# Patient Record
Sex: Female | Born: 1950 | Race: White | Hispanic: No | Smoking: Former smoker
Health system: Southern US, Community
[De-identification: ages and names within clinical notes are randomized; demographics above are authoritative.]

## PROBLEM LIST (undated history)

## (undated) DIAGNOSIS — K760 Fatty (change of) liver, not elsewhere classified: Secondary | ICD-10-CM

## (undated) DIAGNOSIS — F329 Major depressive disorder, single episode, unspecified: Secondary | ICD-10-CM

## (undated) DIAGNOSIS — J45909 Unspecified asthma, uncomplicated: Secondary | ICD-10-CM

## (undated) DIAGNOSIS — Z9889 Other specified postprocedural states: Secondary | ICD-10-CM

## (undated) DIAGNOSIS — E119 Type 2 diabetes mellitus without complications: Secondary | ICD-10-CM

## (undated) DIAGNOSIS — Z460 Encounter for fitting and adjustment of spectacles and contact lenses: Secondary | ICD-10-CM

## (undated) DIAGNOSIS — R0602 Shortness of breath: Secondary | ICD-10-CM

## (undated) DIAGNOSIS — G4730 Sleep apnea, unspecified: Secondary | ICD-10-CM

## (undated) DIAGNOSIS — U071 COVID-19: Secondary | ICD-10-CM

## (undated) DIAGNOSIS — M797 Fibromyalgia: Secondary | ICD-10-CM

## (undated) DIAGNOSIS — F32A Depression, unspecified: Secondary | ICD-10-CM

## (undated) DIAGNOSIS — Z87442 Personal history of urinary calculi: Secondary | ICD-10-CM

## (undated) DIAGNOSIS — M1990 Unspecified osteoarthritis, unspecified site: Secondary | ICD-10-CM

## (undated) DIAGNOSIS — N189 Chronic kidney disease, unspecified: Secondary | ICD-10-CM

## (undated) DIAGNOSIS — I10 Essential (primary) hypertension: Secondary | ICD-10-CM

## (undated) DIAGNOSIS — K219 Gastro-esophageal reflux disease without esophagitis: Secondary | ICD-10-CM

## (undated) DIAGNOSIS — C801 Malignant (primary) neoplasm, unspecified: Secondary | ICD-10-CM

## (undated) DIAGNOSIS — K589 Irritable bowel syndrome without diarrhea: Secondary | ICD-10-CM

## (undated) DIAGNOSIS — E785 Hyperlipidemia, unspecified: Secondary | ICD-10-CM

## (undated) DIAGNOSIS — R112 Nausea with vomiting, unspecified: Secondary | ICD-10-CM

## (undated) DIAGNOSIS — I499 Cardiac arrhythmia, unspecified: Secondary | ICD-10-CM

## (undated) DIAGNOSIS — J302 Other seasonal allergic rhinitis: Secondary | ICD-10-CM

## (undated) HISTORY — DX: Irritable bowel syndrome, unspecified: K58.9

## (undated) HISTORY — PX: COLONOSCOPY: SHX174

## (undated) HISTORY — PX: LUMBAR FUSION: SHX111

## (undated) HISTORY — PX: TONSILLECTOMY: SUR1361

---

## 1988-07-02 HISTORY — PX: DIAGNOSTIC LAPAROSCOPY: SUR761

## 1993-07-02 DIAGNOSIS — C801 Malignant (primary) neoplasm, unspecified: Secondary | ICD-10-CM

## 1993-07-02 HISTORY — DX: Malignant (primary) neoplasm, unspecified: C80.1

## 1993-07-02 HISTORY — PX: BREAST LUMPECTOMY WITH AXILLARY LYMPH NODE DISSECTION: SHX5756

## 1997-11-18 ENCOUNTER — Other Ambulatory Visit: Admission: RE | Admit: 1997-11-18 | Discharge: 1997-11-18 | Payer: Self-pay | Admitting: Obstetrics and Gynecology

## 1998-02-02 ENCOUNTER — Ambulatory Visit (HOSPITAL_COMMUNITY): Admission: RE | Admit: 1998-02-02 | Discharge: 1998-02-02 | Payer: Self-pay | Admitting: Gastroenterology

## 1998-03-04 ENCOUNTER — Encounter: Payer: Self-pay | Admitting: Gastroenterology

## 1998-03-04 ENCOUNTER — Ambulatory Visit (HOSPITAL_COMMUNITY): Admission: RE | Admit: 1998-03-04 | Discharge: 1998-03-04 | Payer: Self-pay | Admitting: Gastroenterology

## 1999-01-18 ENCOUNTER — Other Ambulatory Visit: Admission: RE | Admit: 1999-01-18 | Discharge: 1999-01-18 | Payer: Self-pay | Admitting: *Deleted

## 1999-01-18 ENCOUNTER — Encounter (INDEPENDENT_AMBULATORY_CARE_PROVIDER_SITE_OTHER): Payer: Self-pay

## 2000-01-24 ENCOUNTER — Other Ambulatory Visit: Admission: RE | Admit: 2000-01-24 | Discharge: 2000-01-24 | Payer: Self-pay | Admitting: *Deleted

## 2000-04-09 ENCOUNTER — Other Ambulatory Visit: Admission: RE | Admit: 2000-04-09 | Discharge: 2000-04-09 | Payer: Self-pay | Admitting: Radiology

## 2000-04-09 ENCOUNTER — Encounter (INDEPENDENT_AMBULATORY_CARE_PROVIDER_SITE_OTHER): Payer: Self-pay

## 2001-03-10 ENCOUNTER — Other Ambulatory Visit: Admission: RE | Admit: 2001-03-10 | Discharge: 2001-03-10 | Payer: Self-pay | Admitting: Obstetrics and Gynecology

## 2002-04-10 ENCOUNTER — Other Ambulatory Visit: Admission: RE | Admit: 2002-04-10 | Discharge: 2002-04-10 | Payer: Self-pay | Admitting: Obstetrics and Gynecology

## 2002-06-23 ENCOUNTER — Encounter: Admission: RE | Admit: 2002-06-23 | Discharge: 2002-06-23 | Payer: Self-pay | Admitting: Emergency Medicine

## 2002-06-23 ENCOUNTER — Encounter: Payer: Self-pay | Admitting: Emergency Medicine

## 2003-03-22 ENCOUNTER — Encounter (INDEPENDENT_AMBULATORY_CARE_PROVIDER_SITE_OTHER): Payer: Self-pay | Admitting: Specialist

## 2003-03-22 ENCOUNTER — Ambulatory Visit (HOSPITAL_COMMUNITY): Admission: RE | Admit: 2003-03-22 | Discharge: 2003-03-22 | Payer: Self-pay | Admitting: Gastroenterology

## 2003-05-19 ENCOUNTER — Other Ambulatory Visit: Admission: RE | Admit: 2003-05-19 | Discharge: 2003-05-19 | Payer: Self-pay | Admitting: Obstetrics and Gynecology

## 2003-07-12 ENCOUNTER — Encounter: Admission: RE | Admit: 2003-07-12 | Discharge: 2003-07-12 | Payer: Self-pay | Admitting: Emergency Medicine

## 2003-07-27 ENCOUNTER — Encounter: Admission: RE | Admit: 2003-07-27 | Discharge: 2003-10-25 | Payer: Self-pay | Admitting: Emergency Medicine

## 2003-12-09 ENCOUNTER — Encounter: Admission: RE | Admit: 2003-12-09 | Discharge: 2004-03-08 | Payer: Self-pay | Admitting: Emergency Medicine

## 2005-10-02 ENCOUNTER — Encounter: Admission: RE | Admit: 2005-10-02 | Discharge: 2005-10-02 | Payer: Self-pay | Admitting: Emergency Medicine

## 2006-07-23 ENCOUNTER — Encounter: Admission: RE | Admit: 2006-07-23 | Discharge: 2006-07-23 | Payer: Self-pay | Admitting: Emergency Medicine

## 2006-07-30 ENCOUNTER — Encounter: Admission: RE | Admit: 2006-07-30 | Discharge: 2006-07-30 | Payer: Self-pay | Admitting: Emergency Medicine

## 2008-12-30 ENCOUNTER — Emergency Department (HOSPITAL_COMMUNITY): Admission: EM | Admit: 2008-12-30 | Discharge: 2008-12-30 | Payer: Self-pay | Admitting: Emergency Medicine

## 2009-01-15 ENCOUNTER — Inpatient Hospital Stay (HOSPITAL_COMMUNITY): Admission: EM | Admit: 2009-01-15 | Discharge: 2009-01-15 | Payer: Self-pay | Admitting: Emergency Medicine

## 2009-01-16 ENCOUNTER — Encounter: Admission: RE | Admit: 2009-01-16 | Discharge: 2009-01-16 | Payer: Self-pay | Admitting: Emergency Medicine

## 2009-01-18 ENCOUNTER — Encounter: Payer: Self-pay | Admitting: Internal Medicine

## 2009-01-23 ENCOUNTER — Inpatient Hospital Stay (HOSPITAL_COMMUNITY): Admission: EM | Admit: 2009-01-23 | Discharge: 2009-01-25 | Payer: Self-pay | Admitting: Emergency Medicine

## 2009-01-24 HISTORY — PX: CARDIAC CATHETERIZATION: SHX172

## 2009-01-25 ENCOUNTER — Encounter: Payer: Self-pay | Admitting: Internal Medicine

## 2009-02-03 ENCOUNTER — Encounter: Admission: RE | Admit: 2009-02-03 | Discharge: 2009-02-03 | Payer: Self-pay | Admitting: Emergency Medicine

## 2009-07-02 HISTORY — PX: LUMBAR FUSION: SHX111

## 2009-08-04 ENCOUNTER — Encounter: Payer: Self-pay | Admitting: Internal Medicine

## 2009-08-09 ENCOUNTER — Encounter: Admission: RE | Admit: 2009-08-09 | Discharge: 2009-08-09 | Payer: Self-pay | Admitting: Emergency Medicine

## 2009-09-20 DIAGNOSIS — E785 Hyperlipidemia, unspecified: Secondary | ICD-10-CM | POA: Insufficient documentation

## 2009-09-20 DIAGNOSIS — I10 Essential (primary) hypertension: Secondary | ICD-10-CM | POA: Insufficient documentation

## 2009-09-20 DIAGNOSIS — K219 Gastro-esophageal reflux disease without esophagitis: Secondary | ICD-10-CM | POA: Insufficient documentation

## 2009-09-20 DIAGNOSIS — I4891 Unspecified atrial fibrillation: Secondary | ICD-10-CM | POA: Insufficient documentation

## 2009-09-21 ENCOUNTER — Ambulatory Visit: Payer: Self-pay | Admitting: Internal Medicine

## 2009-10-03 ENCOUNTER — Encounter: Payer: Self-pay | Admitting: Internal Medicine

## 2009-10-06 ENCOUNTER — Telehealth: Payer: Self-pay | Admitting: Internal Medicine

## 2009-10-26 ENCOUNTER — Ambulatory Visit: Payer: Self-pay | Admitting: Internal Medicine

## 2009-11-06 ENCOUNTER — Encounter: Admission: RE | Admit: 2009-11-06 | Discharge: 2009-11-06 | Payer: Self-pay | Admitting: Neurosurgery

## 2009-11-08 ENCOUNTER — Inpatient Hospital Stay (HOSPITAL_COMMUNITY): Admission: RE | Admit: 2009-11-08 | Discharge: 2009-11-10 | Payer: Self-pay | Admitting: Neurosurgery

## 2009-12-02 ENCOUNTER — Telehealth: Payer: Self-pay | Admitting: Internal Medicine

## 2009-12-05 ENCOUNTER — Ambulatory Visit: Payer: Self-pay | Admitting: Internal Medicine

## 2009-12-09 ENCOUNTER — Telehealth: Payer: Self-pay | Admitting: Internal Medicine

## 2009-12-21 ENCOUNTER — Encounter: Admission: RE | Admit: 2009-12-21 | Discharge: 2009-12-21 | Payer: Self-pay | Admitting: Neurosurgery

## 2009-12-29 ENCOUNTER — Telehealth: Payer: Self-pay | Admitting: Internal Medicine

## 2010-01-20 ENCOUNTER — Telehealth: Payer: Self-pay | Admitting: Internal Medicine

## 2010-02-06 ENCOUNTER — Encounter: Admission: RE | Admit: 2010-02-06 | Discharge: 2010-02-06 | Payer: Self-pay | Admitting: Neurosurgery

## 2010-02-06 ENCOUNTER — Telehealth: Payer: Self-pay | Admitting: Internal Medicine

## 2010-02-27 ENCOUNTER — Telehealth: Payer: Self-pay | Admitting: Internal Medicine

## 2010-03-07 ENCOUNTER — Telehealth: Payer: Self-pay | Admitting: Internal Medicine

## 2010-03-07 ENCOUNTER — Ambulatory Visit: Payer: Self-pay | Admitting: Internal Medicine

## 2010-03-09 ENCOUNTER — Telehealth: Payer: Self-pay | Admitting: Internal Medicine

## 2010-03-10 ENCOUNTER — Telehealth: Payer: Self-pay | Admitting: Internal Medicine

## 2010-03-16 ENCOUNTER — Ambulatory Visit: Payer: Self-pay | Admitting: Internal Medicine

## 2010-03-16 ENCOUNTER — Encounter: Payer: Self-pay | Admitting: Internal Medicine

## 2010-03-21 ENCOUNTER — Encounter: Payer: Self-pay | Admitting: Internal Medicine

## 2010-03-28 ENCOUNTER — Telehealth: Payer: Self-pay | Admitting: Internal Medicine

## 2010-04-04 ENCOUNTER — Encounter: Payer: Self-pay | Admitting: Internal Medicine

## 2010-04-18 ENCOUNTER — Telehealth: Payer: Self-pay | Admitting: Internal Medicine

## 2010-04-24 ENCOUNTER — Encounter: Payer: Self-pay | Admitting: Internal Medicine

## 2010-07-02 HISTORY — PX: CARDIAC ELECTROPHYSIOLOGY MAPPING AND ABLATION: SHX1292

## 2010-07-23 ENCOUNTER — Encounter: Payer: Self-pay | Admitting: Emergency Medicine

## 2010-07-24 ENCOUNTER — Encounter: Payer: Self-pay | Admitting: Emergency Medicine

## 2010-08-01 NOTE — Assessment & Plan Note (Signed)
Summary: EKG/  meds making her feel weak/kl   Visit Type:  Follow-up Referring Provider:  Dr Tresa Endo Primary Provider:  Leslee Home, MD   History of Present Illness: The patient presents today for electrophysiology followup. She reports significant reduction in her afib burden with flecainide.  She has noticed over the past 2-3 days, increased fatigue and decreased exercise tolerance.  She denies chest pain, shortness of breath, orthopnea, PND, lower extremity edema, dizziness, presyncope, syncope, or neurologic sequela.   The patient is tolerating medications without difficulties and is otherwise without complaint today.   Current Medications (verified): 1)  Flexeril 10 Mg Tabs (Cyclobenzaprine Hcl) .[name].. 1 Tab Three Times A Day 2)  Glipizide 5 Mg Xr24h-Tab (Glipizide) .... Take One Tablet By Mouth Once Daily. 3)  Lipitor 10 Mg Tabs (Atorvastatin Calcium) .... Take One Tablet By Mouth Daily. 4)  Nabumetone 500 Mg Tabs (Nabumetone) .... Take One Tablet By Mouth Twice Daily. 5)  Ramipril 5 Mg Caps (Ramipril) .... Take One Capsule By Mouth Daily 6)  Metoprolol Tartrate 100 Mg Tabs (Metoprolol Tartrate) .... Take One Tablet By Mouth Twice A Day 7)  Warfarin Sodium 5 Mg Tabs (Warfarin Sodium) .... Use As Directed By Anticoagulation Clinic 8)  Diltiazem Hcl Er Beads 180 Mg Xr24h-Cap (Diltiazem Hcl Er Beads) .... Take One Capsule By Mouth Daily 9)  Vitamin D3 1000 Unit Caps (Cholecalciferol) .... 4 Tablets Once Daily 10)  Vitamin C 1000 Mg Tabs (Ascorbic Acid) .... Take One Tablet By Mouth Twice Daily. 11)  Calcium Carbonate 600 Mg Tabs (Calcium Carbonate) .... Take One Tablet By Mouth Once Daily. 12)  Magnesium Oxide 500 Mg Tabs (Magnesium Oxide) .... Take One Tablet By Mouth Once Daily. 13)  Omeprazole 20 Mg Cpdr (Omeprazole) .... Take One Tablet By Mouth Once Daily. 14)  Multivitamins   Tabs (Multiple Vitamin) .... Take One Tablet By Mouth Once Daily 15)  Flecainide Acetate 100 Mg Tabs  (Flecainide Acetate) .... Take One Tablet By Mouth Every 12 Hours 16)  Lyrica 75 Mg Caps (Pregabalin) .... 2 Capsules Two Times A Day 17)  Furosemide 20 Mg Tabs (Furosemide) .... Take One Tablet Once Daily 18)  Potassium Chloride Crys Cr 20 Meq Cr-Tabs (Potassium Chloride Crys Cr) .... Take One Tablet By Mouth Daily 19)  Co Q 10 10 Mg Caps (Coenzyme Q10) .... Daily  Allergies: 1)  ! Asa 2)  ! Ibuprofen  Past History:  Past Medical History: Reviewed history from 09/21/2009 and no changes required. 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 L breast lumpectomy with lymph node dissection 1995 abdominal laporoscopy 1990 tonsellectomy 1962 adnoidectomy 1957  Social History: Reviewed history from 09/21/2009 and no changes required. Pt lives in [place], alone.  Divorced. Work for [employer] for [employer]- quit at age 44 ETOH- none Drugs- none  Review of Systems       All systems are reviewed and negative except as listed in the HPI.   Vital Signs:  Patient profile:   60 year old female Height:      61 inches Weight:      260 pounds BMI:     49.30 O2 Sat:      96 % on Room air Pulse rate:  60 / minute BP sitting:   108 / 62  (left arm)  Vitals Entered By: Laurance Flatten CMA (March 07, 2010 10:44 AM)  O2 Flow:  Room air  Physical Exam  General:  morbid obesity, NAD Head:  normocephalic and atraumatic Eyes:  PERRLA/EOM intact; conjunctiva and lids normal. Mouth:  Teeth, gums and palate normal. Oral mucosa normal. Neck:  Neck supple, no JVD. No masses, thyromegaly or abnormal cervical nodes. Lungs:  Clear bilaterally to auscultation and percussion. Heart:   RRR, no m/r/g Abdomen:  Bowel sounds positive; abdomen soft and non-tender without masses, organomegaly, or hernias noted. No hepatosplenomegaly. Msk:  Back normal, normal gait. Muscle strength and tone normal. Pulses:  pulses normal in all 4 extremities Extremities:  No clubbing or cyanosis.  trace edema Neurologic:  Alert and oriented x 3.  strength/sensation are intact   EKG  Procedure date:  03/07/2010  Findings:      sinus rhythm 60 bpm, PR 208, QRS 98, QTc 440, otherwise normal ekg  Impression & Recommendations:  Problem # 1:  ATRIAL FIBRILLATION, PAROXYSMAL (ICD-427.31)  stable we will decrease metoprolol to 50mg  two times a day as this may be contributing to fatigue we will consider decreasing metoprolol further as needed. I will plan GXT in the next few weeks to evaluate for exercise induced arrhythmia with flecainide.  Orders: Treadmill (Treadmill)  Problem # 2:  HYPERTENSION (ICD-401.9)  stable as above  Orders: Treadmill (Treadmill)  Problem # 3:  HYPERLIPIDEMIA (ICD-272.4)  stable Her updated medication list for this problem includes:    Lipitor 10 Mg Tabs (Atorvastatin calcium) .[name]... Take one tablet by mouth daily.  Her updated medication list for this problem includes:    Lipitor 10 Mg Tabs (Atorvastatin calcium) .[name]... Take one tablet by mouth daily.  Problem # 4:  GERD (ICD-530.81)  stable Her updated medication list for this problem includes:    Omeprazole 20 Mg Cpdr (Omeprazole) .[name]... Take one tablet by mouth once daily.  Her updated medication list for this problem includes:    Omeprazole 20 Mg Cpdr (Omeprazole) .[name]... Take one tablet by mouth once daily.  Patient Instructions: 1)  Your physician recommends that you schedule a follow-up appointment with Dr Johney Frame in one week with a GXT 2)  Your physician has recommended you make the following change in your medication: decrease Metoprolol  to 50mg  two times a day 3)  Your physician has  requested that you have an exercise tolerance test.  For further information please visit [email or web address]  Please also follow instruction sheet, as given. 4)  Check on Pradaxa and the cost

## 2010-08-01 NOTE — Assessment & Plan Note (Signed)
Summary: nep/afib/jml   Visit Type:  Initial Consult Referring Provider:  Dr Tresa Endo Primary Provider:  Leslee Home, MD  CC:  afib.  History of Present Illness: Katie Owens is a pleasant 60 yo WF with a h/o paroxysmal atrial fibrillation who presents today for EP consultation.  She reports having an "irregular" heart beat since high school.  She subsequently began having significant increases in "irregular heart beats" over the past 10 years.   December 30 2008, she developed rapid heart racing and was found to have atrial fibrillation with symptoms of "heart racing" and presyncope.  She was evaluated at [facility] ER and initiated on diltiazem.  She reports having multiple subsequent episodes of afib.  She failed medical therapy with diltiazem and was subsequently placed on Multaq.  She did not tolerate multaq due to nausea and continued to have afib.  She has subsequently been taking sotalol.  With sotalol, she reports symptoms of arrhythmia 2-3 times per week.  Episodes begin and end abruptly, lasting typically 2-3 hours.  She is unware of triggers or precipitants.  She was diagnosed with OSA and has been compliant with CPAP.  Current Medications (verified): 1)  Flexeril 10 Mg Tabs (Cyclobenzaprine Hcl) .[name].. 1 Tab Three Times A Day 2)  Glipizide 5 Mg Xr24h-Tab (Glipizide) .... Take One Tablet By Mouth Once Daily. 3)  Lipitor 10 Mg Tabs (Atorvastatin Calcium) .... Take One Tablet By Mouth Daily. 4)  Nabumetone 500 Mg Tabs (Nabumetone) .... Take One Tablet By Mouth Twice Daily. 5)  Ramipril 5 Mg Caps (Ramipril) .... Take One Capsule By Mouth Daily 6)  Betapace 120 Mg Tabs (Sotalol Hcl) .... Take One Tablet By Mouth Twice Daily. 7)  Metoprolol Tartrate 100 Mg Tabs (Metoprolol Tartrate) .... Take One Tablet By Mouth Twice A Day 8)  Warfarin Sodium 5 Mg Tabs (Warfarin Sodium) .... Use As Directed By Anticoagulation Clinic 9)  Diltiazem Hcl Er Beads 180 Mg Xr24h-Cap (Diltiazem Hcl Er Beads) .... Take One  Capsule By Mouth Daily 10)  Vitamin D3 1000 Unit Caps (Cholecalciferol) .... 4 Tablets Once Daily 11)  Vitamin C 1000 Mg Tabs (Ascorbic Acid) .... Take One Tablet By Mouth Twice Daily. 12)  Calcium Carbonate 600 Mg Tabs (Calcium Carbonate) .... Take One Tablet By Mouth Once Daily. 13)  Magnesium Oxide 500 Mg Tabs (Magnesium Oxide) .... Take One Tablet By Mouth Once Daily. 14)  Omeprazole 20 Mg Cpdr (Omeprazole) .... Take One Tablet By Mouth Once Daily. 15)  Multivitamins   Tabs (Multiple Vitamin) .... Take One Tablet By Mouth Twice Daily.  Allergies (verified): 1)  ! Asa 2)  ! 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  Divorced. Work for [employer] for [employer]- quit at age 80 ETOH- none Drugs- none  Review of Systems       All systems are reviewed and negative except as listed in the HPI.   Vital Signs:  Patient profile:   60 year old female Height:  61 inches Weight:      269 pounds BMI:     51.01 Pulse rate:   67 / minute BP sitting:   120 / 70  (left arm)  Vitals Entered By: Laurance Flatten CMA (September 21, 2009 2:25 PM)  Physical Exam  General:  morbid obesity, NAD Head:  normocephalic and atraumatic Eyes:  PERRLA/EOM intact; conjunctiva and lids normal. Mouth:  Teeth, gums and palate normal. Oral mucosa normal. Neck:  Neck supple, no JVD. No masses, thyromegaly or abnormal cervical  nodes. Lungs:  Clear bilaterally to auscultation and percussion. Heart:  RRR, no m/r/g Abdomen:  Bowel sounds positive; abdomen soft and non-tender without masses, organomegaly, or hernias noted. No hepatosplenomegaly. Msk:  Back normal, normal gait. Muscle strength and tone normal. Pulses:  pulses normal in all 4 extremities Extremities:  No clubbing or cyanosis.  trace edema Neurologic:  Alert and oriented x 3.  strength/sensation are intact Skin:  Intact without lesions or rashes. Cervical Nodes:  no significant adenopathy Psych:  Normal affect.   EKG  Procedure date:  09/21/2009  Findings:      sinus rhythm, PT 178, QRS 84, QT 460, otherwise normal ekg  Impression & Recommendations:  Problem # 1:  ATRIAL FIBRILLATION, PAROXYSMAL (ICD-427.31) The patient presents today for EP consultation regarding management for atrial arrhythmias.  I have reviewed her event monitor from 11/10 which reveals atrial fibrillation as well as atrial flutter and also frequent PACS/PVCs.  She has a regular SVT which is either atrial flutter or an atrial tachycardia.  She appears to have focal triggers for her atrial arrhythmias. Therapeutic strategies for atrial arrhythmias including medicine and ablation were discussed in detail with the patient today. Risk, benefits, and alternatives to EP study and radiofrequency ablation were also discussed in detail today. At this point, she wishes to continue medical therapy and defer ablation.  Given her recent cath which revealed normal coronary arteries and focal triggers on event monitor, I have recommended flecainide therapy at this time.  She will stop sotalol and then start flecainide 100mg  two times a day after a 48 hour sotalol washout period.  I will then see her again to reassess her afib burden.  If she has continued difficulty with afib, then her options would be either amiodarone or ablation.  She is aware that aggressive lifestyle modification and weight  loss would improve her longterm ability to maintain sinus rhythm and also improve safety of catheter ablation should it be required.  CPAP compliance for OSA was also encouraged  Problem # 2:  HYPERTENSION (ICD-401.9) stable no changes  Problem # 3:  HYPERLIPIDEMIA (ICD-272.4) stable no changes  Patient Instructions: 1)  Your physician recommends that you schedule a follow-up appointment in: 4-5 weks with Dr Johney Frame 2)  Continue regular follow-up with Dr Tresa Endo 3)  Your physician has recommended you make the following change in your medication: Stop Sotalol and be off for 48 hours prior to sstarting new medication  Flecainide100mg  two times a day Prescriptions: FLECAINIDE ACETATE 100 MG TABS (FLECAINIDE ACETATE) one by mouth bid  #60 x 6   Entered by:   Dennis Bast, RN, BSN   Authorized by:   Hillis Range, MD   Signed by:   Dennis Bast, RN, BSN on 09/21/2009   Method used:   Electronically to        [id number] (retail)       [address]       [place], [place]  [address]       Ph: [phone] or [phone]       Fax: [phone]   RxID:   [id number]

## 2010-08-01 NOTE — Progress Notes (Signed)
Summary: Pt having arrhythmias   Phone Note Call from Patient Call back at Home Phone [phone]   Caller: Patient Summary of Call: Pt calling regarding having arrhythmias from 4:30 yesterday to 6:00am this morning and it's started again today at 4:30 Initial call taken by: Judie Grieve,  March 10, 2010 4:36 PM  Follow-up for Phone Call        spoke with Dr Johney Frame pt to increase her Metoprolol back up to 100mg  two times a day.  She feels like she was doing better on Betapace.  She will discuss this with him on Thurs at her appointment Dennis Bast, RN, BSN  March 10, 2010 4:57 PM    New/Updated Medications: METOPROLOL TARTRATE 100 MG TABS (METOPROLOL TARTRATE) Take 1 tablet by mouth twice a day

## 2010-08-01 NOTE — Miscellaneous (Signed)
Summary: [facility] Order/Treatment Plan   [facility] Order/Treatment Plan   Imported By: Roderic Ovens 04/10/2010 16:54:35  _____________________________________________________________________  External Attachment:    Type:   Image     Comment:   External Document

## 2010-08-01 NOTE — Progress Notes (Signed)
Summary: Pt having arrythmia  Phone Note Call from Patient Call back at Home Phone [phone]   Caller: Patient Summary of Call: Pt having arrythmia Initial call taken by: Judie Grieve,  February 27, 2010 4:53 PM  Follow-up for Phone Call        pt changed from Betapace to Flecanide 100mg  two times a day--she took the   first dose of Flecanide yesterday morning--starting about 15-20 minutes ago she felt like her heart began to be fast and irregular-she is unable to take her pulse but feels like it is in the 140 range,she does not know her B/P-she is wondering if this is normal after making the medication change --she denies any dizziness or feeling faint--reviewed with Dr Koleen Distance recommendation was to take evening  dose of Metoprolol now,continue to monitor heart rate --if she feels like her heart rate is fast and irregular tomorrow she should call and come to office for an EKG --she is aware of Dr Jenel Lucks recommendations--

## 2010-08-01 NOTE — Progress Notes (Signed)
Summary: refill request  Phone Note Refill Request Message from:  Patient on January 20, 2010 12:06 PM  Refills Requested: Medication #1:  DILTIAZEM HCL ER BEADS 180 MG XR24H-CAP Take one capsule by mouth daily [facility]- needs today only has 1 left   Method Requested: Telephone to Pharmacy Initial call taken by: Glynda Jaeger,  January 20, 2010 12:06 PM Caller: Patient Reason for Call: Talk to Nurse    Prescriptions: DILTIAZEM HCL ER BEADS 180 MG XR24H-CAP (DILTIAZEM HCL ER BEADS) Take one capsule by mouth daily  #90 x 3   Entered by:   Laurance Flatten CMA   Authorized by:   Hillis Range, MD   Signed by:   Laurance Flatten CMA on 01/20/2010   Method used:   Electronically to        [id number] (retail)       [address]       [place], [place]  [address]       Ph: [phone] or [phone]       Fax: [phone]   RxID:   [id number]

## 2010-08-01 NOTE — Miscellaneous (Signed)
Summary: [facility] Progress Note   [facility] Progress Note   Imported By: Roderic Ovens 04/19/2010 15:51:09  _____________________________________________________________________  External Attachment:    Type:   Image     Comment:   External Document

## 2010-08-01 NOTE — Assessment & Plan Note (Signed)
Summary: Katie Owens   Referring Provider:  Dr Tresa Endo Primary Provider:  Leslee Home, MD  CC:  rov/pt having more problems with palpitations.  She states she has an episode last night that lasted for 3 hrs. She is SOB on exertion.Katie Owens  History of Present Illness: The patient presents today for routine electrophysiology followup. She reports doing very well since last being seen in our clinic.  She reports increasing frequency and duration of atrial fibrillation with sotalol.  She was not compliant with switching to flecainide as I previously suggested.  The patient denies symptoms of chest pain, shortness of breath, orthopnea, PND, lower extremity edema, dizziness, presyncope, syncope, or neurologic sequela.  She is primarily limited  by her chronic backpain for which she recently had a laminectomy 5/11. The patient is tolerating medications without difficulties and is otherwise without complaint today.   Current Medications (verified): 1)  Flexeril 10 Mg Tabs (Cyclobenzaprine Hcl) .Katie Owens.. 1 Tab Three Times A Day 2)  Glipizide 5 Mg Xr24h-Tab (Glipizide) .... Take One Tablet By Mouth Once Daily. 3)  Lipitor 10 Mg Tabs (Atorvastatin Calcium) .... Take One Tablet By Mouth Daily. 4)  Nabumetone 500 Mg Tabs (Nabumetone) .... Take One Tablet By Mouth Twice Daily. 5)  Ramipril 5 Mg Caps (Ramipril) .... Take One Capsule By Mouth Daily 6)  Metoprolol Tartrate 100 Mg Tabs (Metoprolol Tartrate) .... Take One Tablet By Mouth Twice A Day 7)  Warfarin Sodium 5 Mg Tabs (Warfarin Sodium) .... Use As Directed By Anticoagulation Clinic 8)  Diltiazem Hcl Er Beads 180 Mg Xr24h-Cap (Diltiazem Hcl Er Beads) .... Take One Capsule By Mouth Daily 9)  Vitamin D3 1000 Unit Caps (Cholecalciferol) .... 4 Tablets Once Daily 10)  Vitamin C 1000 Mg Tabs (Ascorbic Acid) .... Take One Tablet By Mouth Twice Daily. 11)  Calcium Carbonate 600 Mg Tabs (Calcium Carbonate) .... Take One Tablet By Mouth  Once Daily. 12)  Magnesium Oxide 500 Mg Tabs (Magnesium Oxide) .... Take One Tablet By Mouth Once Daily. 13)  Omeprazole 20 Mg Cpdr (Omeprazole) .... Take One Tablet By Mouth Once Daily. 14)  Multivitamins   Tabs (Multiple Vitamin) .... Take One Tablet By Mouth Once Daily 15)  Sotalol Hcl 120 Mg Tabs (Sotalol Hcl) .... Take One Tablet By Mouth Twice A Day 16)  Percocet 5-325 Mg Tabs (Oxycodone-Acetaminophen) .... Uad 17)  Lyrica 75 Mg Caps (Pregabalin) .... 2 Capsules Two Times A Day 18)  Furosemide 20 Mg Tabs (Furosemide) .... Take One Tablet Once Daily 19)  Potassium Chloride Crys Cr 20 Meq Cr-Tabs (Potassium Chloride Crys Cr) .... Take One Tablet By Mouth Daily 20)  Butrans 5 Mcg/hr Ptwk (Buprenorphine) .... Weekly  Allergies (verified): 1)  ! Asa 2)  ! Ibuprofen  Past History:  Past Medical History: Reviewed history from 09/21/2009 and no changes required. 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 L breast lumpectomy with lymph node dissection 1995 abdominal laporoscopy 1990 tonsellectomy 1962 adnoidectomy 1957  Social History: Reviewed history from 09/21/2009 and no changes required. Pt lives in [place], alone.  Divorced. Work for [employer] for [employer]- quit at age 49 ETOH- none Drugs- none  Review of Systems       All  systems are reviewed and negative except as listed in the HPI.   Vital Signs:  Patient profile:   60 year old female Height:      61 inches Weight:      259 pounds BMI:     49.11 Pulse rate:   66 / minute Pulse rhythm:   regular BP sitting:   136 / 62  (left arm) Cuff size:   large  Vitals Entered By: [name] CMA (December 05, 2009 9:20 AM)  Physical Exam  General:  morbid obesity, NAD Head:  normocephalic and atraumatic Eyes:  PERRLA/EOM intact; conjunctiva and lids normal. Mouth:  Teeth, gums and palate normal. Oral mucosa normal. Neck:  Neck supple, no JVD. No masses, thyromegaly or abnormal cervical nodes. Lungs:  Clear bilaterally to auscultation and percussion. Heart:  RRR, no m/r/g Abdomen:  Bowel sounds positive; abdomen soft and non-tender without masses, organomegaly, or hernias noted. No hepatosplenomegaly. Msk:  Back normal, normal gait. Muscle strength and tone normal. Pulses:  pulses normal in all 4 extremities Extremities:  No clubbing or cyanosis.  trace edema Neurologic:  Alert and oriented x 3.  strength/sensation are intact   EKG  Procedure date:  12/05/2009  Findings:      sinus rhythm 66 bpm, PR 176, QRS 84, QT 470, otherise normal ekg  Impression & Recommendations:  Problem # 1:  ATRIAL FIBRILLATION, PAROXYSMAL (ICD-427.31) The patient presents today for EP follow-up regarding management for atrial arrhythmias.  I have previously reviewed her event monitor from 11/10 which reveals atrial fibrillation as well as atrial flutter and also frequent PACS/PVCs.  She has a regular SVT which is either atrial flutter or an atrial tachycardia.  She appears to have focal triggers for her atrial arrhythmias.  Though I have recommended flecainide, she has been reluctant to take it.   Her cath 7/10 was normal.  She now wishes to take flecainide.   She will continue coumadin. I have recommended aggressive lifestyle modification, including weight reduction.  I think that with weight loss, our efforts to maintain sinus rhythm would be improved.   She will stop sotalol and start flecainide 100mg  two times a day after a 72 hour washout period.  Problem # 2:  HYPERTENSION (ICD-401.9) stable  Other Orders: EKG w/ Interpretation (93000)  Patient Instructions: 1)  Your physician recommends that you  schedule a follow-up appointment in: 4 weeks with Dr Johney Frame 2)  Your physician has recommended you make the following change in your medication: stop Sotalol.  After 72 hours start Flecainide 100mg  two times a day

## 2010-08-01 NOTE — Letter (Signed)
Summary: [facility] Clearance   Imported By: Roderic Ovens 10/17/2009 16:05:46  _____________________________________________________________________  External Attachment:    Type:   Image     Comment:   External Document

## 2010-08-01 NOTE — Letter (Signed)
Summary: Med List  Med List   Imported By: Marylou Mccoy 09/22/2009 15:11:03  _____________________________________________________________________  External Attachment:    Type:   Image     Comment:   External Document

## 2010-08-01 NOTE — Progress Notes (Signed)
Summary: med refill / unable to switch due to work scheudle  Phone Note Call from Patient Call back at [employer] [phone]   Caller: Patient Reason for Call: Talk to Nurse Summary of Call: pt needs sotalol refill has not made the switch to flecanide [facility] [phone] Initial call taken by: Edman Circle,  February 06, 2010 12:56 PM  Follow-up for Phone Call        [facility] . pt unable to switch meds over due to work scheudle. [phone] Lorne Skeens  February 08, 2010 12:30 PM   Additional Follow-up for Phone Call Additional follow up Details #1::        called in sotalol 120mg . Pt states that she  will start flecainide at the end of this month. Additional Follow-up by: Laurance Flatten CMA,  February 09, 2010 2:02 PM    New/Updated Medications: SOTALOL HCL 120 MG TABS (SOTALOL HCL) Take one tablet by mouth twice a day Prescriptions: SOTALOL HCL 120 MG TABS (SOTALOL HCL) Take one tablet by mouth twice a day  #60 x 0   Entered by:   Laurance Flatten CMA   Authorized by:   Hillis Range, MD   Signed by:   Laurance Flatten CMA on 02/08/2010   Method used:   Electronically to        [id number]       [address]       [place], [place]  [address]       Ph: [phone] or [phone]       Fax: [phone]   RxID:   [id number]

## 2010-08-01 NOTE — Progress Notes (Signed)
Summary: how many pounds pt need to lose -abaltion  Phone Note Call from Patient Call back at [facility] Phone [phone]   Caller: Patient Reason for Call: Talk to Nurse Summary of Call: per pt calling how many lbs pt need to lose to be approve for ablation.  Initial call taken by: Lorne Skeens,  March 28, 2010 3:50 PM  Follow-up for Phone Call        will discuss with dr taylor and let pt know Dennis Bast, RN, BSN  March 28, 2010 4:27 PM lmon Follow-up by: Hillis Range, MD,  March 29, 2010 11:45 AM  Additional Follow-up for Phone Call Additional follow up Details #1::        The patient is morbidly obese and also very deconditioned.  She could not exercise more than 2 minutes on a modified Bruce protochol while in sinus rhythm.  I therefore do not think that her overall reduction in quality of life and decreased exercise tolerance are due to her afib. We will not be able to adequately treat her until she has significant weigt loss. I would recommend that she consider seeing Dr Orson Aloe at [facility] for a second opinion if she wants ablation.  Otherwise continue aggressive lifestyle modification.    Additional Follow-up for Phone Call Additional follow up Details #2::    left message on machine for patient with above information Dennis Bast, RN, BSN  March 31, 2010 3:48 PM

## 2010-08-01 NOTE — Progress Notes (Signed)
Summary: Question about medication and hair loss-?JA  Phone Note Call from Patient Call back at Home Phone [phone]   Caller: Patient Summary of Call: Pt have question about medication and hair loss Initial call taken by: Judie Grieve,  December 09, 2009 9:32 AM  Follow-up for Phone Call        will change to Flecainide wkend of 20th.  Is questioning if the Metoprolol may be the cause of her hair thinning.  Will address this with Dr Johney Frame and call patient back with suggestions. Is having a lot of swelling in feet over the last couple of days notices more in the afternoon. Is down in morning but not gone completely. Dennis Bast, RN, BSN  December 09, 2009 3:20 PM Follow-up by: Hillis Range, MD,  December 09, 2009 5:52 PM  Additional Follow-up for Phone Call Additional follow up Details #1::        continue metoprolol as this is unlikely the cause for her hair thinning.  We can discuss this further upon return.  Salt restriction for swelling.  If her swelling worsens, she should call her primary cardiologist. Additional Follow-up by: Hillis Range, MD,  December 09, 2009 5:52 PM    Additional Follow-up for Phone Call Additional follow up Details #2::    pt aware of Dr Amedeo Plenty suggestions Dennis Bast, RN, BSN  December 19, 2009 4:11 PM

## 2010-08-01 NOTE — Letter (Signed)
Summary: [facility]  [facility]   Imported By: Marylou Mccoy 09/22/2009 15:12:05  _____________________________________________________________________  External Attachment:    Type:   Image     Comment:   External Document

## 2010-08-01 NOTE — Progress Notes (Signed)
Summary: meds  Phone Note Call from Patient Call back at Home Phone [phone]   Caller: Patient Reason for Call: Talk to Nurse Summary of Call: pt has not be able to make the change over to the new meds, due to family problems Initial call taken by: Migdalia Dk,  December 29, 2009 3:37 PM  Follow-up for Phone Call        will foward to Dr Johney Frame Dennis Bast, RN, BSN  December 29, 2009 4:52 PM

## 2010-08-01 NOTE — Progress Notes (Signed)
Summary: cardiac rehab needs maintanence referral  Phone Note From Other Clinic   Caller: [facility] [phone] Summary of Call: olinty with cardiac rehab calling re a maintanence referral for pt Initial call taken by: Glynda Jaeger,  April 18, 2010 2:28 PM  Follow-up for Phone Call        Dr Johney Frame has form.  will fax when he signs [place] aware Dennis Bast, RN, BSN  April 18, 2010 2:43 PM

## 2010-08-01 NOTE — Assessment & Plan Note (Signed)
Summary: rov @ 1:45  ok per dr alllred   Visit Type:  Follow-up Referring Provider:  Dr Tresa Endo Primary Provider:  Leslee Home, MD   History of Present Illness: The patient presents today for routine electrophysiology followup. She reports doing very well since last being seen in our clinic. She has had only 1 episode of afib since last being seen in our office.  She did not take flecainide as recommended but continues to sotalol.  The patient denies symptoms of chest pain, shortness of breath, orthopnea, PND, lower extremity edema, dizziness, presyncope, syncope, or neurologic sequela.  She is primarily limited  by her chronic backpain. The patient is tolerating medications without difficulties and is otherwise without complaint today.   Current Medications (verified): 1)  Flexeril 10 Mg Tabs (Cyclobenzaprine Hcl) .[name].. 1 Tab Three Times A Day 2)  Glipizide 5 Mg Xr24h-Tab (Glipizide) .... Take One Tablet By Mouth Once Daily. 3)  Lipitor 10 Mg Tabs (Atorvastatin Calcium) .... Take One Tablet By Mouth Daily. 4)  Nabumetone 500 Mg Tabs (Nabumetone) .... Take One Tablet By Mouth Twice Daily. 5)  Ramipril 5 Mg Caps (Ramipril) .... Take One Capsule By Mouth Daily 6)  Metoprolol Tartrate 100 Mg Tabs (Metoprolol Tartrate) .... Take One Tablet By Mouth Twice A Day 7)  Warfarin Sodium 5 Mg Tabs (Warfarin Sodium) .... Use As Directed By Anticoagulation Clinic 8)  Diltiazem Hcl Er Beads 180 Mg Xr24h-Cap (Diltiazem Hcl Er Beads) .... Take One Capsule By Mouth Daily 9)  Vitamin D3 1000 Unit Caps (Cholecalciferol) .... 4 Tablets Once Daily 10)  Vitamin C 1000 Mg Tabs (Ascorbic Acid) .... Take One Tablet By Mouth Twice Daily. 11)  Calcium Carbonate 600 Mg Tabs (Calcium Carbonate) .... Take One Tablet By Mouth Once Daily. 12)  Magnesium Oxide 500 Mg Tabs (Magnesium Oxide) .... Take One Tablet By Mouth Once Daily. 13)  Omeprazole 20 Mg Cpdr (Omeprazole) .... Take One Tablet By Mouth Once Daily. 14)   Multivitamins   Tabs (Multiple Vitamin) .... Take One Tablet By Mouth Twice Daily. 15)  Sotalol Hcl 120 Mg Tabs (Sotalol Hcl) .... Take One Tablet By Mouth Twice A Day 16)  Percocet 5-325 Mg Tabs (Oxycodone-Acetaminophen) .... Uad 17)  Lyrica 75 Mg Caps (Pregabalin) .... 2 Capsules Two Times A Day 18)  Hydrochlorothiazide 12.5 Mg Tabs (Hydrochlorothiazide) .... Take One Tablet By Mouth Daily. 19)  Potassium Chloride Crys Cr 20 Meq Cr-Tabs (Potassium Chloride Crys Cr) .... Take One Tablet By Mouth Daily 20)  Butrans 5 Mcg/hr Ptwk (Buprenorphine) .... Weekly  Allergies (verified): 1)  ! Asa 2)  ! Ibuprofen  Past History:  Past Medical History: Reviewed history from 09/21/2009 and no changes required. 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 L breast lumpectomy with lymph node dissection 1995 abdominal laporoscopy 1990 tonsellectomy 1962 adnoidectomy 1957  Social History: Reviewed history from 09/21/2009 and no changes required. Pt lives in [place], alone.  Divorced. Work for [employer] for [employer]- quit at age 45 ETOH- none Drugs- none  Vital Signs:  Patient profile:   60 year old female Height:      61 inches Weight:      267 pounds BMI:     50.63 Pulse rate:  71 / minute BP sitting:   110 / 66  (left arm)  Vitals Entered By: Laurance Flatten CMA (October 26, 2009 1:48 PM)  Physical Exam  General:  morbid obesity, NAD Head:  normocephalic and atraumatic Eyes:  PERRLA/EOM intact; conjunctiva and lids normal. Mouth:  Teeth, gums and palate normal. Oral mucosa normal. Neck:  Neck supple, no JVD. No masses, thyromegaly or abnormal cervical nodes. Lungs:  Clear  bilaterally to auscultation and percussion. Heart:  RRR, no m/r/g Abdomen:  Bowel sounds positive; abdomen soft and non-tender without masses, organomegaly, or hernias noted. No hepatosplenomegaly. Msk:  Back normal, normal gait. Muscle strength and tone normal. Pulses:  pulses normal in all 4 extremities Extremities:  No clubbing or cyanosis.  trace edema Neurologic:  Alert and oriented x 3.  strength/sensation are intact Skin:  Intact without lesions or rashes.   EKG  Procedure date:  10/26/2009  Findings:      sinus rhythm 70 bpm, QT 460, otherwise normal ekg  Impression & Recommendations:  Problem # 1:  ATRIAL FIBRILLATION, PAROXYSMAL (ICD-427.31)  The patient presents today for EP follow-up regarding management for atrial arrhythmias.  I have previously reviewed her event monitor from 11/10 which reveals atrial fibrillation as well as atrial flutter and also frequent PACS/PVCs.  She has a regular SVT which is either atrial flutter or an atrial tachycardia.  She appears to have focal triggers for her atrial arrhythmias.  Though I have recommended flecainide, she wishes to continue sotalol at this point.  She will continue coumadin. I have recommended aggressive lifestyle modification, including weight reduction.  I think that with weight loss, our efforts to maintain sinus rhythm would be improved.  She wishes to defer catheter ablation until after her back surgery which is planned for early May.  I think that this is wise.  In the interim, I have recommended weight loss.  Her updated medication list for this problem includes:    Metoprolol Tartrate 100 Mg Tabs (Metoprolol tartrate) .[name]... Take one tablet by mouth twice a day    Warfarin Sodium 5 Mg Tabs (Warfarin sodium) ..... Use as directed by anticoagulation clinic    Sotalol Hcl 120 Mg Tabs (Sotalol hcl) .[name]... Take one tablet by mouth twice a day  Problem # 2:  HYPERTENSION (ICD-401.9)  stable no changes  Her updated  medication list for this problem includes:    Ramipril 5 Mg Caps (Ramipril) .[name]... Take one capsule by mouth daily    Metoprolol Tartrate 100 Mg Tabs (Metoprolol tartrate) .[name]... Take one tablet by mouth twice a day    Diltiazem Hcl Er Beads 180 Mg Xr24h-cap (Diltiazem hcl er beads) .[name]... Take one capsule by mouth daily    Sotalol Hcl 120 Mg Tabs (Sotalol hcl) .[name]... Take one tablet by mouth twice a day    Hydrochlorothiazide 12.5 Mg Tabs (Hydrochlorothiazide) .[name]... Take one tablet by mouth daily.  Other Orders: EKG w/ Interpretation (93000)  Patient Instructions: 1)  Your physician recommends that you schedule a follow-up appointment in: 3 months. 2)  Your physician recommends that you continue on your current medications as directed. Please refer to the Current Medication list given to you today.

## 2010-08-01 NOTE — Letter (Signed)
Summary: Physician Referral Form  Physician Referral Form   Imported By: Marylou Mccoy 05/15/2010 08:13:39  _____________________________________________________________________  External Attachment:    Type:   Image     Comment:   External Document

## 2010-08-01 NOTE — Progress Notes (Signed)
Summary: problem with med  Phone Note Call from Patient   Caller: Patient [phone] Reason for Call: Talk to Nurse Summary of Call: started new med last week-now has extreme weakness-and when walking across the room has some [phone] Initial call taken by: Glynda Jaeger,  March 07, 2010 9:52 AM  Follow-up for Phone Call        pt will come in for EKG today Dennis Bast, RN, BSN  March 07, 2010 9:59 AM

## 2010-08-01 NOTE — Progress Notes (Signed)
Summary: pt having chest pain  Phone Note Call from Patient Call back at Home Phone [phone]   Reason for Call: Talk to Nurse, Talk to Doctor Summary of Call: pt is still having arrythmia's and some chest pain even with medication changes Initial call taken by: Omer Jack,  March 09, 2010 4:42 PM  Follow-up for Phone Call        03/09/10--502pm--pt calling stating sl increase in CP and arrthymias seem to be increasing( lowered dose of metoprolol from 100mg  to 50mg  two times a day--03/07/10) was told to call if any problem with dosage decrease --states CP mild but worried about arrthymias --dr Zykee Avakian doing EP procedure --spoke with kelly--advised pt to increase metoprolol to 75mg  two times a day AND IF ANY INCREASE IN CP go to nearest ED--pt agrees--also has GXT next week with dr Ander Slade to discuss this at that time--nt Follow-up by: Ledon Snare, RN,  March 09, 2010 5:10 PM

## 2010-08-01 NOTE — Progress Notes (Signed)
Summary: pt has ? about upcoming appt.  Phone Note Call from Patient Call back at cell# [phone]   Caller: Patient Reason for Call: Talk to Nurse, Talk to Doctor Summary of Call: per pt call she decided not to start flecainide she is doing better by controling caffiene pt wants to schedule back surgery as soon as possible and will discuss abaltion at appt Initial call taken by: Omer Jack,  October 06, 2009 1:20 PM  Follow-up for Phone Call        Spoke with pt and she said she was referred to Dr. Johney Frame by Dr. Daphene Jaeger at [facility] for possible ablation. She has decreased her intake of caffiene and has noticed a difference (reduction) in palpitations.  So she does not want to start flecainide at this time - and also b/c the side effects scare her.   She is having bad back pain and has been referred to [facility] for possible surgery.  However, she doesn't have an initial appt. date yet.  She is focusing on and wants to get this taken care of before doing anything about the ablation. As of now, she has an appt. with Dr. Johney Frame on 10/26/09.  She is wondering if she should keep this appt. since she probably won't be doing the ablation very soon; or should she keep it with the idea that she will have had the initial appt. with the neurosurgeons and the appt. 4/27 would be for surgical clearance? I told her I would pass this on for Dr. Johney Frame to determine.  I also asked her to keep our office up to date about any upcoming appts. with other offices. She verbalized understanding. Follow-up by: Minerva Areola, RN, BSN,  October 06, 2009 4:27 PM     Appended Document: pt has ? about upcoming appt. LMOM for pt to call me back   Appended Document: pt has ? about upcoming appt. I would like to see her back as scheduled to assess her afib at that time.  I doubt that caffeine reduction will have a longterm effect on her arrhythmia burden.  She was supposed to stop sotalol and  replace it with flecainide after allowing sotalol to "wash out".  Have her call Tresa Endo in my office with questions about this.  Again, she should follow-up with me as scheduled.  Appended Document: pt has ? about upcoming appt. LMOM with above information

## 2010-08-01 NOTE — Progress Notes (Signed)
Summary: arrhythmia  JA pt where to move to????????  Phone Note Call from Patient Call back at [phone]   Caller: Patient Reason for Call: Talk to Nurse Summary of Call: pt having arrhythmia problems for about 2 weeks, request sooner appt Initial call taken by: Migdalia Dk,  December 02, 2009 2:41 PM  Follow-up for Phone Call        will come in on Monday with Dr Johney Frame Dennis Bast, RN, BSN  December 02, 2009 5:17 PM

## 2010-08-01 NOTE — Consult Note (Signed)
Summary: [facility]  [facility]   Imported By: Marylou Mccoy 09/22/2009 15:14:39  _____________________________________________________________________  External Attachment:    Type:   Image     Comment:   External Document

## 2010-08-03 NOTE — Miscellaneous (Signed)
Summary: [facility]   [facility]   Imported By: Roderic Ovens 07/04/2010 11:43:57  _____________________________________________________________________  External Attachment:    Type:   Image     Comment:   External Document

## 2010-09-19 LAB — CBC
HCT: 38.4 % (ref 36.0–46.0)
MCHC: 34.8 g/dL (ref 30.0–36.0)
RDW: 13.3 % (ref 11.5–15.5)
WBC: 6.3 10*3/uL (ref 4.0–10.5)

## 2010-09-19 LAB — GLUCOSE, CAPILLARY
Glucose-Capillary: 118 mg/dL — ABNORMAL HIGH (ref 70–99)
Glucose-Capillary: 169 mg/dL — ABNORMAL HIGH (ref 70–99)
Glucose-Capillary: 170 mg/dL — ABNORMAL HIGH (ref 70–99)
Glucose-Capillary: 186 mg/dL — ABNORMAL HIGH (ref 70–99)
Glucose-Capillary: 252 mg/dL — ABNORMAL HIGH (ref 70–99)

## 2010-09-19 LAB — PROTIME-INR
INR: 1.06 (ref 0.00–1.49)
INR: 1.95 — ABNORMAL HIGH (ref 0.00–1.49)
Prothrombin Time: 22.1 seconds — ABNORMAL HIGH (ref 11.6–15.2)

## 2010-09-19 LAB — APTT: aPTT: 28 seconds (ref 24–37)

## 2010-09-19 LAB — TYPE AND SCREEN: Antibody Screen: NEGATIVE

## 2010-09-19 LAB — BASIC METABOLIC PANEL
Calcium: 9.9 mg/dL (ref 8.4–10.5)
GFR calc non Af Amer: >60 mL/min (ref >60)

## 2010-09-19 LAB — SURGICAL PCR SCREEN
MRSA, PCR: NEGATIVE
Staphylococcus aureus: NEGATIVE

## 2010-10-08 LAB — CBC
HCT: 33.1 % — ABNORMAL LOW (ref 36.0–46.0)
HCT: 37.3 % (ref 36.0–46.0)
Hemoglobin: 11.3 g/dL — ABNORMAL LOW (ref 12.0–15.0)
Hemoglobin: 12.5 g/dL (ref 12.0–15.0)
Hemoglobin: 13.8 g/dL (ref 12.0–15.0)
MCHC: 33.4 g/dL (ref 30.0–36.0)
MCHC: 33.8 g/dL (ref 30.0–36.0)
MCHC: 33.7 g/dL (ref 30.0–36.0)
MCV: 85.7 fL (ref 78.0–100.0)
MCV: 85.8 fL (ref 78.0–100.0)
MCV: 87 fL (ref 78.0–100.0)
MCV: 87.3 fL (ref 78.0–100.0)
MCV: 85.3 fL (ref 78.0–100.0)
Platelets: 215 10*3/uL (ref 150–400)
Platelets: 243 10*3/uL (ref 150–400)
RBC: 4.2 MIL/uL (ref 3.87–5.11)
RBC: 4.49 MIL/uL (ref 3.87–5.11)
RBC: 4.81 MIL/uL (ref 3.87–5.11)
RDW: 14.9 % (ref 11.5–15.5)
RDW: 14.9 % (ref 11.5–15.5)
WBC: 7.1 10*3/uL (ref 4.0–10.5)
WBC: 7.4 10*3/uL (ref 4.0–10.5)

## 2010-10-08 LAB — CARDIAC PANEL(CRET KIN+CKTOT+MB+TROPI)
Relative Index: INVALID (ref 0.0–2.5)
Relative Index: INVALID (ref 0.0–2.5)
Total CK: 44 U/L (ref 7–177)
Troponin I: 0.02 ng/mL (ref 0.00–0.06)

## 2010-10-08 LAB — COMPREHENSIVE METABOLIC PANEL
ALT: 20 U/L (ref 0–35)
AST: 23 U/L (ref 0–37)
Alkaline Phosphatase: 62 U/L (ref 39–117)
CO2: 23 mEq/L (ref 19–32)
GFR calc Af Amer: >60 mL/min (ref >60)
Glucose, Bld: 161 mg/dL — ABNORMAL HIGH (ref 70–99)
Potassium: 4 mEq/L (ref 3.5–5.1)
Sodium: 135 mEq/L (ref 135–145)
Total Protein: 7.1 g/dL (ref 6.0–8.3)

## 2010-10-08 LAB — DIFFERENTIAL
Basophils Absolute: 0 10*3/uL (ref 0.0–0.1)
Basophils Absolute: 0 10*3/uL (ref 0.0–0.1)
Basophils Relative: 0 % (ref 0–1)
Basophils Relative: 1 % (ref 0–1)
Basophils Relative: 0 % (ref 0–1)
Eosinophils Absolute: 0 10*3/uL (ref 0.0–0.7)
Eosinophils Absolute: 0.1 10*3/uL (ref 0.0–0.7)
Eosinophils Absolute: 0 10*3/uL (ref 0.0–0.7)
Eosinophils Relative: 1 % (ref 0–5)
Eosinophils Relative: 1 % (ref 0–5)
Lymphocytes Relative: 30 % (ref 12–46)
Lymphs Abs: 2.1 10*3/uL (ref 0.7–4.0)
Monocytes Absolute: 0.4 10*3/uL (ref 0.1–1.0)
Monocytes Absolute: 0.5 10*3/uL (ref 0.1–1.0)
Monocytes Absolute: 0.2 10*3/uL (ref 0.1–1.0)
Monocytes Relative: 7 % (ref 3–12)
Monocytes Relative: 8 % (ref 3–12)
Monocytes Relative: 3 % (ref 3–12)
Neutro Abs: 4.4 10*3/uL (ref 1.7–7.7)
Neutrophils Relative %: 62 % (ref 43–77)

## 2010-10-08 LAB — BASIC METABOLIC PANEL
BUN: 14 mg/dL (ref 6–23)
BUN: 18 mg/dL (ref 6–23)
CO2: 24 mEq/L (ref 19–32)
CO2: 27 mEq/L (ref 19–32)
CO2: 23 mEq/L (ref 19–32)
Calcium: 9.1 mg/dL (ref 8.4–10.5)
Calcium: 9.5 mg/dL (ref 8.4–10.5)
Chloride: 105 mEq/L (ref 96–112)
Chloride: 106 mEq/L (ref 96–112)
Chloride: 109 mEq/L (ref 96–112)
Chloride: 105 mEq/L (ref 96–112)
Creatinine, Ser: 0.79 mg/dL (ref 0.4–1.2)
Creatinine, Ser: 0.85 mg/dL (ref 0.4–1.2)
Creatinine, Ser: 0.82 mg/dL (ref 0.4–1.2)
GFR calc Af Amer: >60 mL/min (ref >60)
GFR calc Af Amer: >60 mL/min (ref >60)
GFR calc Af Amer: >60 mL/min (ref >60)
GFR calc non Af Amer: >60 mL/min (ref >60)
GFR calc non Af Amer: >60 mL/min (ref >60)
Glucose, Bld: 143 mg/dL — ABNORMAL HIGH (ref 70–99)
Glucose, Bld: 161 mg/dL — ABNORMAL HIGH (ref 70–99)
Potassium: 3.6 mEq/L (ref 3.5–5.1)
Potassium: 4.1 mEq/L (ref 3.5–5.1)
Sodium: 137 mEq/L (ref 135–145)
Sodium: 139 mEq/L (ref 135–145)
Sodium: 139 mEq/L (ref 135–145)
Sodium: 138 mEq/L (ref 135–145)

## 2010-10-08 LAB — CK TOTAL AND CKMB (NOT AT ARMC)
CK, MB: 0.6 ng/mL (ref 0.3–4.0)
CK, MB: 0.6 ng/mL (ref 0.3–4.0)
CK, MB: 0.6 ng/mL (ref 0.3–4.0)
Relative Index: INVALID (ref 0.0–2.5)
Relative Index: INVALID (ref 0.0–2.5)
Total CK: 38 U/L (ref 7–177)
Total CK: 38 U/L (ref 7–177)
Total CK: 44 U/L (ref 7–177)

## 2010-10-08 LAB — GLUCOSE, CAPILLARY
Glucose-Capillary: 179 mg/dL — ABNORMAL HIGH (ref 70–99)
Glucose-Capillary: 83 mg/dL (ref 70–99)
Glucose-Capillary: 91 mg/dL (ref 70–99)

## 2010-10-08 LAB — MAGNESIUM: Magnesium: 2 mg/dL (ref 1.5–2.5)

## 2010-10-08 LAB — POCT CARDIAC MARKERS
CKMB, poc: <1 ng/mL — ABNORMAL LOW (ref 1.0–8.0)
Myoglobin, poc: 37.5 ng/mL (ref 12–200)
Troponin i, poc: <0.05 ng/mL (ref 0.00–0.09)

## 2010-10-08 LAB — PROTIME-INR
INR: 0.9 (ref 0.00–1.49)
Prothrombin Time: 12.4 seconds (ref 11.6–15.2)

## 2010-10-08 LAB — TROPONIN I: Troponin I: 0.02 ng/mL (ref 0.00–0.06)

## 2010-10-08 LAB — TSH
TSH: 1.079 u[IU]/mL (ref 0.350–4.500)
TSH: 1.747 u[IU]/mL (ref 0.350–4.500)

## 2010-10-08 LAB — HEPARIN LEVEL (UNFRACTIONATED)
Heparin Unfractionated: 0.68 IU/mL (ref 0.30–0.70)
Heparin Unfractionated: <0.1 IU/mL — ABNORMAL LOW (ref 0.30–0.70)

## 2010-10-08 LAB — LIPID PANEL
HDL: 61 mg/dL (ref >39)
LDL Cholesterol: 58 mg/dL (ref 0–99)
Triglycerides: 106 mg/dL (ref <150)
VLDL: 21 mg/dL (ref 0–40)

## 2010-10-08 LAB — HEMOGLOBIN A1C
Hgb A1c MFr Bld: 6.2 % — ABNORMAL HIGH (ref 4.6–6.1)
Mean Plasma Glucose: 131 mg/dL

## 2010-11-14 NOTE — Cardiovascular Report (Signed)
NAMEADALIND, Katie Owens                ACCOUNT NO.:  [id number]   MEDICAL RECORD NO.:  [id number]           PATIENT TYPE:   LOCATION:                                 FACILITY:   PHYSICIAN:  Sheliah Mends, MD      DATE OF BIRTH:  1951/02/25   DATE OF PROCEDURE:  01/24/2009  DATE OF DISCHARGE:                            CARDIAC CATHETERIZATION   INDICATION:  Katie Owens is a 60 year old lady with history of chest pain  and an abnormal myocardial perfusion scan.  Cardiac history is  complicated by history of paroxysmal atrial fibrillation.  Katie Owens has  multiple risk factors for coronary artery disease including type 2  diabetes mellitus, dyslipidemia, and hypertension and obesity.  Given  her symptoms, abnormal stress test, and multiple risk factors, decision  was made to proceed to cardiac catheterization.   PROCEDURES:  1. Left heart catheterization.  2. Left ventriculography.  3. Selective coronary angiography.   After informed consent was obtained, the patient was brought to the  second floor cardiac catheterization lab at [facility] in the  postabsorptive state.  She was draped in sterile fashion.  A 1%  lidocaine was applied to the right groin to supply local anesthesia.  The patient was started on conscious sedation with fentanyl and Versed.  A 5-French arterial sheath was introduced into the right femoral artery  without complications.  Subsequently, a 5-French Judkins #4 right and  left catheter was used for selective coronary angiography.  In addition,  the pigtail catheter was used for left ventriculography.  A 3.5 and left  Amplatz catheter was used as well for left coronary angiography;  however, the 5-French #4 Judkins catheter gave the best results.   FINDINGS:  Hemodynamics:  Aortic pressure 158/77 mmHg.  Left ventricular  pressure 155/21 mmHg.   Left ventriculography:  Left ventriculography shows normal left  ventricular size and function with an ejection  fraction of 65%.   Coronary angiography:  1. Left main coronary artery:  The left main coronary artery is a very      short vessel that almost immediately bifurcates in to the LAD and      left circumflex artery.  The left main coronary artery has no      significant disease.  2. Left anterior descending artery:  The left anterior descending      artery is a medium-sized vessel that gives off a large diagonal 1      branch and a smaller diagonal 2 and 3 branches.  There is no      angiographically significant disease seen in the left anterior      descending artery.  3. Left circumflex artery:  The left circumflex artery is a large,      codominant vessel that gives off a large branching OM-1 and a PLA.      There is no angiographically significant disease seen in the left      circumflex artery.  4. Right coronary artery:  The right coronary artery is a large,      codominant vessel.  There  is no angiographically significant      disease seen in the right coronary artery.   SUMMARY:  1. Normal coronary arteries without evidence of angiographically      significant disease.  2. Normal left ventricular function.  3. No significant aortic gradient.  No significant mitral      regurgitation.   The procedure was completed without complication.   RECOMMENDATIONS:  Katie Owens will follow up with Dr. Tresa Endo on aggressive  risk factor modification.  She may benefit from a workup for obstructive  sleep apnea.  In addition, her dose of Coreg will be increased to 25 mg  p.o. b.i.d. to suppress her atrial fibrillation and to improve blood  pressure control.      Sheliah Mends, MD  Electronically Signed     JE/MEDQ  D:  01/24/2009  T:  01/25/2009  Job:  [id number]

## 2010-11-14 NOTE — Discharge Summary (Signed)
Katie Owens, Katie Owens                ACCOUNT NO.:  [id number]   MEDICAL RECORD NO.:  [id number]          PATIENT TYPE:  INP   LOCATION:  3729                         FACILITY:  [facility]   PHYSICIAN:  Antonieta Iba, MD   DATE OF BIRTH:  Aug 13, 1950   DATE OF ADMISSION:  01/23/2009  DATE OF DISCHARGE:  01/25/2009                               DISCHARGE SUMMARY   DISCHARGE DIAGNOSES:  1. Chest discomfort with abnormal Myoview revealing anterior wall      ischemia, false positive result.  2. Status post cardiac catheterization this admission revealing no      coronary artery disease.  3. Normal ejection fraction of 65%.  4. Recurrent proximal atrial fibrillation with rapid ventricular      response and also brady while in sinus rhythm on medications.  This      is her third admission for paroxysmal atrial fibrillation.  5. Hypertension.  6. Non-insulin-dependent diabetes mellitus.  7. Gastroesophageal reflux disease.  8. Probable sleep apnea.  9. Hyperlipidemia.  10.Questionable of fibromyalgia.   LABORATORY STUDIES:  Sodium 139, potassium 4.1, chloride 109, CO2 25,  glucose 143, BUN 15, creatinine 0.73.  CPK, MBs and troponins were all  negative.  Hemoglobin A1c was 6.2.  TSH was 1.079.  Total cholesterol is  140, triglycerides 106, HDL of 61 and LDL is 58.  Magnesium was 2.10.  Chest x-ray on January 23, 2009 shows slight prominence of the pulmonary  vascularity.  Her heart size is slightly more prominent than on prior  exam.   DISCHARGE MEDICATIONS:  1. Lyrica 75 mg at 2 a.m. and 3 p.m.  2. Flexeril 10 mg t.i.d. as needed.  3. Glipizide SR 5 mg daily in the morning.  4. Actos 15 mg daily in the p.m.  5. Lipitor 10 mg every day.  6. Hyzaar 100/25 mg every day.  7. Nabumetone 500 mg twice a day.  8. Klor-Con 20 mEq daily.  9. Ramipril 5 mg daily.  10.Prednisone 10 mg every day.  11.Vitamin D3 4000 international units every day.  12.Vitamin C 2000 international units every day.  13.Magnesium 500 mg daily.  14.Multivitamin with lycopene two tablets daily.  15.Prilosec 20 mg daily.   She will start sotalol 80 mg twice per day, warfarin 5 mg daily at 6  p.m. diltiazem 30 mg to be taken on a p.r.n. as-needed basis.  She can  take one of her tachypalpitations occur and are frequent or lasts for  more than half an hour.  She may take one every 6 hours if needed.   She will have an appoint with Dr. Tresa Owens in 1-2 weeks.  Our office will  call with an appointment.  She has appointment with Katie [facility] at [facility], on the second floor with Dr. Landry Dyke on January 28, 2009  at 10:40.  She was told not to do any strenuous activity, lifting,  pushing, pulling for 1 week and if she has any swelling, bruising, or  increased pain in her groin, she should give our office a call.   HOSPITAL COURSE:  Ms.  Katie Owens called after dinner.  She apparently went  into again rapid atrial fibrillation.  She felt she had  tachypalpitations.  She had diaphoresis.  She had chest discomfort, thus  she was told to come to the hospital and she was started on IV Cardizem  drip.  Her records were reviewed.  She was found to have a recent  abnormal stress test.  It was decided to admit her and performed  possible cardiac catheterization.  This was her third admission with  recurrent PAF.  Her rhythm kept going rapidly during the night, but she  also had symptoms of bradycardia when she went into sinus rhythm.  Her  heart rate was down in the high 30s.  She underwent cardiac  catheterization then on January 24, 2009 by Dr. Sheliah Mends.  She had no  coronary artery disease and her EF was 65%.  The case was discussed with  him and it was decided to discontinue her Coreg and her diltiazem and  put her on sotalol 80 mg b.i.d. during her last episode of AFib was the  night of January 24, 2009 about 5:30.  On January 25, 2009, she was seen by  Dr. Mariah Owens who discussed with her the use of short term Coumadin.   The  patient was agreeable with this.  Thus, we will start her on Coumadin as  an outpatient.  We will give her some education about Coumadin and its  side effects and drug interactions prior to her departure.  That day,  she was leading her blood pressure was 151/74 before she got her  medications, heart rate was 74, respirations were 18, temperature was  98.1, O2 sats were 96% on room air.      Katie Owens, N.P.      Antonieta Iba, MD  Electronically Signed    BB/MEDQ  D:  01/25/2009  T:  01/26/2009  Job:  [id number]   cc:   Reuben Likes, M.D.

## 2010-11-17 NOTE — Op Note (Signed)
Katie Owens, Katie Owens                          ACCOUNT NO.:  [id number]   MEDICAL RECORD NO.:  [id number]                   PATIENT TYPE:  AMB   LOCATION:  ENDO                                 FACILITY:  [facility]   PHYSICIAN:  Petra Kuba, M.D.                 DATE OF BIRTH:  1950-08-28   DATE OF PROCEDURE:  03/22/2003  DATE OF DISCHARGE:                                 OPERATIVE REPORT   PROCEDURE:  Colonoscopy.   INDICATION FOR PROCEDURE:  Screening.  Consent was signed after risks,  benefits, methods, and options thoroughly discussed multiple times in the  past.   MEDICINES USED:  Fentanyl 100 mg, Versed 10 mg.   DESCRIPTION OF PROCEDURE:  Rectal inspection was pertinent for external  hemorrhoids.  Digital exam was negative.  The video colonoscope was inserted  with some difficulty due to some looping.  We were able to advance to the  cecum.  This did require first rolling her on her back and then on her right  side.  No abnormalities were seen on insertion.  The cecum was identified by  the appendiceal orifice and the ileocecal valve.  Prep on the right side was  only fair.  There was stool adherent to the wall, which could not be washed  off.  The rest of the prep was adequate.  Lots of washing and suctioning was  done prior to good visualization. On slow withdrawal through the colon, the  cecum, ascending, and transverse were normal.  As the scope was withdrawn  around the left side of the colon, in the midsigmoid a probable hyperplastic-  appearing polyp was seen, which was cold biopsied x1.  Another tiny polyp  was seen in the rectum, which was cold biopsied x1 and put in the same  container.  Anorectal pull-through and retroflexion confirmed some small  hemorrhoids.  The scope was reinserted a short way up the left side of the  colon, air was suctioned, the scope removed.  The patient tolerated the  procedure well.  There was no obvious immediate complication.   ENDOSCOPIC DIAGNOSES:  1. Internal-external hemorrhoid.  2. Two tiny probable hyperplastic-appearing polyps in the right colon and     midsigmoid, cold biopsied.  3. Otherwise within normal limits to the cecum.    PLAN:  Happy to see back p.r.n.  Return care to Dr. Lorenz Coaster for the customary  health care maintenance to include yearly rectals and guaiacs, and otherwise  repeat screening in five years unless a surprise on pathology or symptoms  change.                                               Petra Kuba, M.D.    MEM/MEDQ  D:  03/22/2003  T:  03/22/2003  Job:  [id number]   cc:   Reuben Likes, M.D.  [address]  [place]  [place] [address]  Fax: [phone]   Pollyann Savoy, M.D.  [address]  [place], [place] [address]  Fax: [phone]

## 2011-02-20 ENCOUNTER — Other Ambulatory Visit: Payer: Self-pay | Admitting: Emergency Medicine

## 2011-02-20 DIAGNOSIS — R1031 Right lower quadrant pain: Secondary | ICD-10-CM

## 2011-02-22 ENCOUNTER — Other Ambulatory Visit: Payer: Self-pay

## 2011-02-27 ENCOUNTER — Ambulatory Visit
Admission: RE | Admit: 2011-02-27 | Discharge: 2011-02-27 | Disposition: A | Discharge status: Home / Self Care | Payer: 59 | Source: Ambulatory Visit | Attending: Emergency Medicine | Admitting: Emergency Medicine

## 2011-02-27 DIAGNOSIS — R1031 Right lower quadrant pain: Secondary | ICD-10-CM

## 2012-10-07 ENCOUNTER — Encounter (HOSPITAL_BASED_OUTPATIENT_CLINIC_OR_DEPARTMENT_OTHER): Payer: Self-pay | Admitting: *Deleted

## 2012-10-07 NOTE — Progress Notes (Signed)
To come in for labs -called dr Tresa Endo for cardiology notes-hx AF-had ablation 2012-none since Uses cpap-to bring all meds ans cpap

## 2012-10-09 ENCOUNTER — Encounter (HOSPITAL_BASED_OUTPATIENT_CLINIC_OR_DEPARTMENT_OTHER)
Admission: RE | Admit: 2012-10-09 | Discharge: 2012-10-09 | Disposition: A | Discharge status: Home / Self Care | Payer: 59 | Source: Ambulatory Visit | Attending: Orthopedic Surgery | Admitting: Orthopedic Surgery

## 2012-10-09 LAB — BASIC METABOLIC PANEL
BUN: 13 mg/dL (ref 6–23)
CO2: 28 mEq/L (ref 19–32)
Calcium: 9.7 mg/dL (ref 8.4–10.5)
Creatinine, Ser: 0.79 mg/dL (ref 0.50–1.10)
Glucose, Bld: 138 mg/dL — ABNORMAL HIGH (ref 70–99)

## 2012-10-13 ENCOUNTER — Other Ambulatory Visit: Payer: Self-pay | Admitting: Orthopedic Surgery

## 2012-10-14 ENCOUNTER — Encounter (HOSPITAL_BASED_OUTPATIENT_CLINIC_OR_DEPARTMENT_OTHER): Payer: Self-pay | Admitting: Orthopedic Surgery

## 2012-10-14 ENCOUNTER — Ambulatory Visit (HOSPITAL_BASED_OUTPATIENT_CLINIC_OR_DEPARTMENT_OTHER): Payer: 59 | Admitting: Anesthesiology

## 2012-10-14 ENCOUNTER — Encounter (HOSPITAL_BASED_OUTPATIENT_CLINIC_OR_DEPARTMENT_OTHER): Payer: Self-pay | Admitting: Anesthesiology

## 2012-10-14 ENCOUNTER — Ambulatory Visit (HOSPITAL_BASED_OUTPATIENT_CLINIC_OR_DEPARTMENT_OTHER)
Admission: RE | Admit: 2012-10-14 | Discharge: 2012-10-14 | Disposition: A | Discharge status: Home / Self Care | Payer: 59 | Source: Ambulatory Visit | Attending: Orthopedic Surgery | Admitting: Orthopedic Surgery

## 2012-10-14 ENCOUNTER — Encounter (HOSPITAL_BASED_OUTPATIENT_CLINIC_OR_DEPARTMENT_OTHER)
Admission: RE | Disposition: A | Discharge status: Home / Self Care | Payer: Self-pay | Source: Ambulatory Visit | Attending: Orthopedic Surgery

## 2012-10-14 DIAGNOSIS — K219 Gastro-esophageal reflux disease without esophagitis: Secondary | ICD-10-CM | POA: Insufficient documentation

## 2012-10-14 DIAGNOSIS — IMO0001 Reserved for inherently not codable concepts without codable children: Secondary | ICD-10-CM | POA: Insufficient documentation

## 2012-10-14 DIAGNOSIS — J45909 Unspecified asthma, uncomplicated: Secondary | ICD-10-CM | POA: Insufficient documentation

## 2012-10-14 DIAGNOSIS — F329 Major depressive disorder, single episode, unspecified: Secondary | ICD-10-CM | POA: Insufficient documentation

## 2012-10-14 DIAGNOSIS — E669 Obesity, unspecified: Secondary | ICD-10-CM | POA: Insufficient documentation

## 2012-10-14 DIAGNOSIS — Z886 Allergy status to analgesic agent status: Secondary | ICD-10-CM | POA: Insufficient documentation

## 2012-10-14 DIAGNOSIS — Z6841 Body Mass Index (BMI) 40.0 and over, adult: Secondary | ICD-10-CM | POA: Insufficient documentation

## 2012-10-14 DIAGNOSIS — E785 Hyperlipidemia, unspecified: Secondary | ICD-10-CM | POA: Insufficient documentation

## 2012-10-14 DIAGNOSIS — M129 Arthropathy, unspecified: Secondary | ICD-10-CM | POA: Insufficient documentation

## 2012-10-14 DIAGNOSIS — G5600 Carpal tunnel syndrome, unspecified upper limb: Secondary | ICD-10-CM | POA: Insufficient documentation

## 2012-10-14 DIAGNOSIS — F3289 Other specified depressive episodes: Secondary | ICD-10-CM | POA: Insufficient documentation

## 2012-10-14 DIAGNOSIS — I10 Essential (primary) hypertension: Secondary | ICD-10-CM | POA: Insufficient documentation

## 2012-10-14 DIAGNOSIS — E119 Type 2 diabetes mellitus without complications: Secondary | ICD-10-CM | POA: Insufficient documentation

## 2012-10-14 DIAGNOSIS — Z853 Personal history of malignant neoplasm of breast: Secondary | ICD-10-CM | POA: Insufficient documentation

## 2012-10-14 DIAGNOSIS — G4730 Sleep apnea, unspecified: Secondary | ICD-10-CM | POA: Insufficient documentation

## 2012-10-14 DIAGNOSIS — Z87891 Personal history of nicotine dependence: Secondary | ICD-10-CM | POA: Insufficient documentation

## 2012-10-14 HISTORY — DX: Hyperlipidemia, unspecified: E78.5

## 2012-10-14 HISTORY — DX: Shortness of breath: R06.02

## 2012-10-14 HISTORY — DX: Gastro-esophageal reflux disease without esophagitis: K21.9

## 2012-10-14 HISTORY — DX: Fibromyalgia: M79.7

## 2012-10-14 HISTORY — DX: Sleep apnea, unspecified: G47.30

## 2012-10-14 HISTORY — DX: Unspecified osteoarthritis, unspecified site: M19.90

## 2012-10-14 HISTORY — DX: Depression, unspecified: F32.A

## 2012-10-14 HISTORY — DX: Cardiac arrhythmia, unspecified: I49.9

## 2012-10-14 HISTORY — PX: CARPAL TUNNEL RELEASE: SHX101

## 2012-10-14 HISTORY — DX: Encounter for fitting and adjustment of spectacles and contact lenses: Z46.0

## 2012-10-14 HISTORY — DX: Malignant (primary) neoplasm, unspecified: C80.1

## 2012-10-14 HISTORY — DX: Nausea with vomiting, unspecified: Z98.890

## 2012-10-14 HISTORY — DX: Chronic kidney disease, unspecified: N18.9

## 2012-10-14 HISTORY — DX: Other specified postprocedural states: R11.2

## 2012-10-14 HISTORY — DX: Essential (primary) hypertension: I10

## 2012-10-14 HISTORY — DX: Unspecified asthma, uncomplicated: J45.909

## 2012-10-14 HISTORY — DX: Other seasonal allergic rhinitis: J30.2

## 2012-10-14 HISTORY — DX: Type 2 diabetes mellitus without complications: E11.9

## 2012-10-14 HISTORY — DX: Major depressive disorder, single episode, unspecified: F32.9

## 2012-10-14 LAB — GLUCOSE, CAPILLARY: Glucose-Capillary: 148 mg/dL — ABNORMAL HIGH (ref 70–99)

## 2012-10-14 SURGERY — CARPAL TUNNEL RELEASE
Anesthesia: Regional | Site: Wrist | Laterality: Right | Wound class: Clean

## 2012-10-14 MED ORDER — FENTANYL CITRATE 0.05 MG/ML IJ SOLN
INTRAMUSCULAR | Status: DC | PRN
  Administered 2012-10-14: 50 ug via INTRAVENOUS

## 2012-10-14 MED ORDER — OXYCODONE HCL 5 MG PO TABS: 5.0000 mg | ORAL_TABLET | Freq: Once | ORAL | Status: DC | PRN

## 2012-10-14 MED ORDER — CHLORHEXIDINE GLUCONATE 4 % EX LIQD: 60.0000 mL | Freq: Once | CUTANEOUS | Status: DC

## 2012-10-14 MED ORDER — HYDROCODONE-ACETAMINOPHEN 5-325 MG PO TABS: 1.0000 | ORAL_TABLET | Freq: Four times a day (QID) | ORAL | Status: DC | PRN

## 2012-10-14 MED ORDER — CEFAZOLIN SODIUM-DEXTROSE 2-3 GM-% IV SOLR: 2.0000 g | INTRAVENOUS | Status: DC

## 2012-10-14 MED ORDER — PROMETHAZINE HCL 25 MG/ML IJ SOLN: 6.2500 mg | INTRAMUSCULAR | Status: DC | PRN

## 2012-10-14 MED ORDER — LACTATED RINGERS IV SOLN
INTRAVENOUS | Status: DC
  Administered 2012-10-14: 09:00:00 via INTRAVENOUS

## 2012-10-14 MED ORDER — MIDAZOLAM HCL 5 MG/5ML IJ SOLN
INTRAMUSCULAR | Status: DC | PRN
  Administered 2012-10-14 (×2): 1 mg via INTRAVENOUS

## 2012-10-14 MED ORDER — FENTANYL CITRATE 0.05 MG/ML IJ SOLN: 50.0000 ug | INTRAMUSCULAR | Status: DC | PRN

## 2012-10-14 MED ORDER — MEPERIDINE HCL 25 MG/ML IJ SOLN: 6.2500 mg | INTRAMUSCULAR | Status: DC | PRN

## 2012-10-14 MED ORDER — MIDAZOLAM HCL 2 MG/2ML IJ SOLN: 0.5000 mg | Freq: Once | INTRAMUSCULAR | Status: DC | PRN

## 2012-10-14 MED ORDER — MIDAZOLAM HCL 2 MG/2ML IJ SOLN: 1.0000 mg | INTRAMUSCULAR | Status: DC | PRN

## 2012-10-14 MED ORDER — OXYCODONE HCL 5 MG/5ML PO SOLN: 5.0000 mg | Freq: Once | ORAL | Status: DC | PRN

## 2012-10-14 MED ORDER — CEFAZOLIN SODIUM-DEXTROSE 2-3 GM-% IV SOLR
2.0000 g | INTRAVENOUS | Status: AC
  Administered 2012-10-14: 2 g via INTRAVENOUS

## 2012-10-14 MED ORDER — LIDOCAINE HCL (PF) 1 % IJ SOLN
INTRAMUSCULAR | Status: DC | PRN
  Administered 2012-10-14: 20 mL

## 2012-10-14 MED ORDER — FENTANYL CITRATE 0.05 MG/ML IJ SOLN: 25.0000 ug | INTRAMUSCULAR | Status: DC | PRN

## 2012-10-14 MED ORDER — BUPIVACAINE HCL (PF) 0.25 % IJ SOLN
INTRAMUSCULAR | Status: DC | PRN
  Administered 2012-10-14: 7 mL

## 2012-10-14 MED ORDER — ONDANSETRON HCL 4 MG/2ML IJ SOLN
INTRAMUSCULAR | Status: DC | PRN
  Administered 2012-10-14: 4 mg via INTRAVENOUS

## 2012-10-14 MED ORDER — PROPOFOL 10 MG/ML IV EMUL
INTRAVENOUS | Status: DC | PRN
  Administered 2012-10-14: 75 ug/kg/min via INTRAVENOUS

## 2012-10-14 MED ORDER — LIDOCAINE HCL (CARDIAC) 20 MG/ML IV SOLN: INTRAVENOUS | Status: DC | PRN

## 2012-10-14 SURGICAL SUPPLY — 39 items
BANDAGE GAUZE ELAST BULKY 4 IN (GAUZE/BANDAGES/DRESSINGS) ×2 IMPLANT
BLADE SURG 15 STRL LF DISP TIS (BLADE) ×1 IMPLANT
BLADE SURG 15 STRL SS (BLADE) ×1
BNDG COHESIVE 3X5 TAN STRL LF (GAUZE/BANDAGES/DRESSINGS) ×2 IMPLANT
BNDG ESMARK 4X9 LF (GAUZE/BANDAGES/DRESSINGS) IMPLANT
CHLORAPREP W/TINT 26ML (MISCELLANEOUS) ×2 IMPLANT
CLOTH BEACON ORANGE TIMEOUT ST (SAFETY) ×2 IMPLANT
CORDS BIPOLAR (ELECTRODE) ×2 IMPLANT
COVER MAYO STAND STRL (DRAPES) ×2 IMPLANT
COVER TABLE BACK 60X90 (DRAPES) ×2 IMPLANT
CUFF TOURNIQUET SINGLE 18IN (TOURNIQUET CUFF) ×2 IMPLANT
DRAPE EXTREMITY T 121X128X90 (DRAPE) ×2 IMPLANT
DRAPE SURG 17X23 STRL (DRAPES) ×2 IMPLANT
DRSG KUZMA FLUFF (GAUZE/BANDAGES/DRESSINGS) ×2 IMPLANT
GAUZE XEROFORM 1X8 LF (GAUZE/BANDAGES/DRESSINGS) ×2 IMPLANT
GLOVE BIO SURGEON STRL SZ 6.5 (GLOVE) IMPLANT
GLOVE BIOGEL PI IND STRL 7.0 (GLOVE) ×1 IMPLANT
GLOVE BIOGEL PI IND STRL 8.5 (GLOVE) ×1 IMPLANT
GLOVE BIOGEL PI INDICATOR 7.0 (GLOVE) ×1
GLOVE BIOGEL PI INDICATOR 8.5 (GLOVE) ×1
GLOVE ECLIPSE 6.5 STRL STRAW (GLOVE) ×2 IMPLANT
GLOVE SURG ORTHO 8.0 STRL STRW (GLOVE) ×2 IMPLANT
GOWN BRE IMP PREV XXLGXLNG (GOWN DISPOSABLE) ×2 IMPLANT
GOWN PREVENTION PLUS XLARGE (GOWN DISPOSABLE) ×2 IMPLANT
NEEDLE 27GAX1X1/2 (NEEDLE) IMPLANT
NS IRRIG 1000ML POUR BTL (IV SOLUTION) ×2 IMPLANT
PACK BASIN DAY SURGERY FS (CUSTOM PROCEDURE TRAY) ×2 IMPLANT
PAD CAST 3X4 CTTN HI CHSV (CAST SUPPLIES) ×1 IMPLANT
PADDING CAST ABS 4INX4YD NS (CAST SUPPLIES) ×1
PADDING CAST ABS COTTON 4X4 ST (CAST SUPPLIES) ×1 IMPLANT
PADDING CAST COTTON 3X4 STRL (CAST SUPPLIES) ×1
SPONGE GAUZE 4X4 12PLY (GAUZE/BANDAGES/DRESSINGS) ×2 IMPLANT
STOCKINETTE 4X48 STRL (DRAPES) ×2 IMPLANT
SUT VICRYL 4-0 PS2 18IN ABS (SUTURE) IMPLANT
SUT VICRYL RAPIDE 4/0 PS 2 (SUTURE) ×2 IMPLANT
SYR BULB 3OZ (MISCELLANEOUS) ×2 IMPLANT
SYR CONTROL 10ML LL (SYRINGE) ×2 IMPLANT
TOWEL OR 17X24 6PK STRL BLUE (TOWEL DISPOSABLE) ×2 IMPLANT
UNDERPAD 30X30 INCONTINENT (UNDERPADS AND DIAPERS) ×2 IMPLANT

## 2012-10-14 NOTE — H&P (Signed)
Katie Owens is a 62 year old right hand dominant female complaining of pain, numbness and tingling to both hands, right greater than left. These are intermittent, sharp, throbbing and aching type pain with a feeling of numbness to virtually all of her fingers. She has been taking Relafen. She is not awakened at night. Activity makes it worse and rest makes it better. Doing her hair causes problems and driving her car causes problems. She has a history of diabetes and arthritis. She has no history of thyroid problems or gout. There is a family history of diabetes. She had nerve conductions done 10 years ago which were positive and repeated in 2008 which were positive. We had initially scheduled her for surgical intervention but did not have it done. She has had her nerve conductions repeated revealing a motor delay of 5.3, 4.8 right and left; sensory delays commensurate with carpal tunnel syndrome bilaterally with an amplitude diminution on her right side. She does not want to try any further conservative treatment for this.  She would like to have the right side released.      ALLERGIES:   Penicillin, aspirin and ibuprofen.  MEDICATIONS:  She is on Lyrica, Lexapro, Cyclobenzaprine, Lipitor, Relafen, calcium, Carvedilol, Losartan, Metformin, Trugene, Omeprazole, Loperamide and vitamin D.  SURGICAL HISTORY:   She has had a tonsillectomy, adenoidectomy, lumpectomy, spinal fusion at L4/5.   FAMILY H ISTORY:  Positive for diabetes, high BP and arthritis.  SOCIAL HISTORY:  She does not smoke or drink. She is divorced. She is an Catering manager for [employer].   REVIEW OF SYSTEMS:  Positive for cancer, glasses, contacts, hearing loss, ringing in her ears, high BP, depression, otherwise negative. Katie Owens is an 62 y.o. female.   Chief Complaint: CTS Rt HPI: see above  Past Medical History  Diagnosis Date  . Hypertension   . Dysrhythmia     hx AF  . Depression   .  Shortness of breath   . Asthma   . Sleep apnea     uses a cpap  . Diabetes mellitus without complication   . Chronic kidney disease     hx stones  . GERD (gastroesophageal reflux disease)   . Arthritis   . Fibromyalgia   . Cancer 1995    breast cancer lt with 12 nodes out  . Seasonal allergies   . Contact lens/glasses fitting     wears contacts or glasses  . Hyperlipemia   . PONV (postoperative nausea and vomiting)     Past Surgical History  Procedure Laterality Date  . Breast lumpectomy with axillary lymph node dissection  1995    12 nodes removed  . Diagnostic laparoscopy  1990  . Tonsillectomy    . Lumbar fusion    . Cardiac electrophysiology mapping and ablation  2012  . Colonoscopy      History reviewed. No pertinent family history. Social History:  reports that she quit smoking about 33 years ago. She does not have any smokeless tobacco history on file. She reports that she does not drink alcohol or use illicit drugs.  Allergies:  Allergies  Allergen Reactions  . Aspirin Hives  . Ibuprofen Hives    No prescriptions prior to admission    No results found for this or any previous visit (from the past 48 hour(s)).  No results found.   Pertinent items are noted in HPI.  Height 5\' 1"  (1.549 m), weight 103.42 kg (228 lb).  General appearance: alert,  cooperative and appears stated age Head: Normocephalic, without obvious abnormality Neck: no JVD Resp: clear to auscultation bilaterally Cardio: regular rate and rhythm, S1, S2 normal, no murmur, click, rub or gallop GI: soft, non-tender; bowel sounds normal; no masses,  no organomegaly Extremities: extremities normal, atraumatic, no cyanosis or edema Pulses: 2+ and symmetric Skin: Skin color, texture, turgor normal. No rashes or lesions Neurologic: Grossly normal Incision/Wound: na  Assessment/Plan   The pre, peri and postoperative course were discussed along with the risks and complications.  The patient  is aware there is no guarantee with the surgery, possibility of infection, recurrence, injury to arteries, nerves, tendons, incomplete relief of symptoms and dystrophy.   She is scheduled for right carpal tunnel release as outpatient under regional anesthesia.  Jazara Swiney R 10/14/2012, 7:39 AM

## 2012-10-14 NOTE — Transfer of Care (Signed)
Immediate Anesthesia Transfer of Care Note  Patient: Katie Owens  Procedure(s) Performed: Procedure(s): CARPAL TUNNEL RELEASE (Right)  Patient Location: PACU  Anesthesia Type:Bier block  Level of Consciousness: awake and patient cooperative  Airway & Oxygen Therapy: Patient Spontanous Breathing and Patient connected to face mask oxygen  Post-op Assessment: Report given to PACU RN and Post -op Vital signs reviewed and stable  Post vital signs: Reviewed and stable  Complications: No apparent anesthesia complications

## 2012-10-14 NOTE — Anesthesia Postprocedure Evaluation (Signed)
  Anesthesia Post-op Note  Patient: Katie Owens  Procedure(s) Performed: Procedure(s): CARPAL TUNNEL RELEASE (Right)  Patient Location: PACU  Anesthesia Type:MAC and Bier block  Level of Consciousness: awake, alert , oriented and patient cooperative  Airway and Oxygen Therapy: Patient Spontanous Breathing  Post-op Pain: none  Post-op Assessment: Post-op Vital signs reviewed, Patient's Cardiovascular Status Stable, Respiratory Function Stable, Patent Airway, No signs of Nausea or vomiting and Pain level controlled  Post-op Vital Signs: Reviewed and stable  Complications: No apparent anesthesia complications

## 2012-10-14 NOTE — Addendum Note (Signed)
Addendum created 10/14/12 1223 by Lance Coon, CRNA   Modules edited: Anesthesia Medication Administration

## 2012-10-14 NOTE — Anesthesia Preprocedure Evaluation (Addendum)
Anesthesia Evaluation  Patient identified by MRN, date of birth, ID band Patient awake    Reviewed: Allergy & Precautions, H&P , NPO status , Patient's Chart, lab work & pertinent test results, reviewed documented beta blocker date and time   History of Anesthesia Complications (+) PONV  Airway Mallampati: I TM Distance: >3 FB Neck ROM: Full    Dental  (+) Teeth Intact and Dental Advisory Given   Pulmonary asthma , sleep apnea , former smoker,  breath sounds clear to auscultation  Pulmonary exam normal       Cardiovascular hypertension, Pt. on medications and Pt. on home beta blockers + dysrhythmias (s/p ablation) Atrial Fibrillation Rhythm:Regular Rate:Normal  '10 cath: normal coronaries, normal LVF   Neuro/Psych    GI/Hepatic Neg liver ROS, GERD-  Medicated and Controlled,  Endo/Other  diabetes (glu 115)Morbid obesity  Renal/GU negative Renal ROS     Musculoskeletal  (+) Fibromyalgia -  Abdominal (+) + obese,   Peds  Hematology   Anesthesia Other Findings   Reproductive/Obstetrics                          Anesthesia Physical Anesthesia Plan  ASA: III  Anesthesia Plan: MAC and Bier Block   Post-op Pain Management:    Induction:   Airway Management Planned: Natural Airway and Simple Face Mask  Additional Equipment:   Intra-op Plan:   Post-operative Plan:   Informed Consent: I have reviewed the patients History and Physical, chart, labs and discussed the procedure including the risks, benefits and alternatives for the proposed anesthesia with the patient or authorized representative who has indicated his/her understanding and acceptance.     Plan Discussed with: Surgeon and CRNA  Anesthesia Plan Comments: (Plan routine monitors, IV regional lidocaine with MAC)        Anesthesia Quick Evaluation

## 2012-10-14 NOTE — Brief Op Note (Signed)
10/14/2012  10:11 AM  PATIENT:  Sherald Barge  62 y.o. female  PRE-OPERATIVE DIAGNOSIS:  RIGHT CARPAL TUNNEL SYNDROME  POST-OPERATIVE DIAGNOSIS:  RIGHT CARPAL TUNNEL SYNDROME  PROCEDURE:  Procedure(s): CARPAL TUNNEL RELEASE (Right)  SURGEON:  Surgeon(s) and Role:    * Nicki Reaper, MD - Primary  PHYSICIAN ASSISTANT:   ASSISTANTS: none   ANESTHESIA:   local and regional  EBL:  Total I/O In: 700 [I.V.:700] Out: -   BLOOD ADMINISTERED:none  DRAINS: none   LOCAL MEDICATIONS USED:  MARCAINE     SPECIMEN:  No Specimen  DISPOSITION OF SPECIMEN:  N/A  COUNTS:  YES  TOURNIQUET:   Total Tourniquet Time Documented: Forearm (Right) - 23 minutes Total: Forearm (Right) - 23 minutes   DICTATION: .Other Dictation: Dictation Number [id number]  PLAN OF CARE: Discharge to home after PACU  PATIENT DISPOSITION:  PACU - hemodynamically stable.

## 2012-10-14 NOTE — Op Note (Signed)
Dictation Number [id number]

## 2012-10-15 ENCOUNTER — Encounter (HOSPITAL_BASED_OUTPATIENT_CLINIC_OR_DEPARTMENT_OTHER): Payer: Self-pay | Admitting: Orthopedic Surgery

## 2012-10-15 NOTE — Op Note (Signed)
NAMELAVERGNE, HILTUNEN                ACCOUNT NO.:  [id number]  MEDICAL RECORD NO.:  [id number]  LOCATION:                                 FACILITY:  PHYSICIAN:  Cindee Salt, M.D.            DATE OF BIRTH:  DATE OF PROCEDURE:  10/14/2012 DATE OF DISCHARGE:                              OPERATIVE REPORT   PREOPERATIVE DIAGNOSIS:  Carpal tunnel syndrome, right hand.  POSTOPERATIVE DIAGNOSIS:  Carpal tunnel syndrome, right hand.  OPERATION:  Decompression of the right median nerve.  SURGEON:  Cindee Salt, MD  ANESTHESIA:  Forearm-based IV regional with local infiltration.  ANESTHESIOLOGISTJean Rosenthal.  HISTORY:  The patient is a 62 year old female with a history of carpal tunnel syndrome, EMG nerve conductions positive.  This does not respond to conservative treatment.  She has elected to undergo surgical decompression to the median nerve.  Pre, peri, and postoperative course have been discussed along with risks and complications.  She is aware that there is no guarantee with the surgery; possibility of infection; recurrence of injury to arteries, nerves, tendons; incomplete relief of symptoms, dystrophy.  In the preoperative area, the patient is seen, the extremity marked by both the patient and surgeon.  Antibiotic given.  PROCEDURE:  The patient was brought to the operating room where a forearm-based IV regional anesthetic was carried out without difficulty. On her right arm, she was prepped and draped using ChloraPrep.  A 3- minute dry time was allowed.  Time-out taken, confirming the patient and procedure.  A longitudinal incision was made in the right palm carried down through the subcutaneous tissue.  Bleeders were electrocauterized with bipolar.  Palmar fascia was split.  Superficial palmar arch identified.  The flexor tendon to the ring little finger identified to the ulnar side of the median nerve.  The carpal retinaculum was incised with sharp dissection.  Right angle and  Sewall retractor were placed between skin and forearm fascia.  Fascia was released for approximately 1.5 cm proximal to the wrist crease under direct vision.  Canal was explored.  Air compression to the nerve was apparent.  No further lesions were identified.  The wound was irrigated and the skin closed with interrupted 4-0 Vicryl Rapide sutures.  A local infiltration with 0.25% Marcaine without epinephrine was given, approximately 7 mL was used.  Sterile compressive dressing with the fingers free was applied.  On deflation of the tourniquet, all fingers immediately pinked.  She was taken to the recovery room for observation in satisfactory condition.  She will be discharged home to return to the [facility] in 1 week on [place].          ______________________________ Cindee Salt, M.D.     GK/MEDQ  D:  10/14/2012  T:  10/15/2012  Job:  [id number]

## 2013-01-29 ENCOUNTER — Ambulatory Visit: Payer: 59 | Admitting: Cardiovascular Disease

## 2013-02-25 ENCOUNTER — Ambulatory Visit: Payer: 59 | Admitting: Cardiovascular Disease

## 2013-07-10 ENCOUNTER — Ambulatory Visit
Admission: RE | Admit: 2013-07-10 | Discharge: 2013-07-10 | Disposition: A | Discharge status: Home / Self Care | Payer: 59 | Source: Ambulatory Visit | Attending: Family Medicine | Admitting: Family Medicine

## 2013-07-10 ENCOUNTER — Other Ambulatory Visit: Payer: Self-pay | Admitting: Family Medicine

## 2013-07-10 DIAGNOSIS — M25512 Pain in left shoulder: Secondary | ICD-10-CM

## 2013-07-10 DIAGNOSIS — M79602 Pain in left arm: Secondary | ICD-10-CM

## 2014-01-18 ENCOUNTER — Other Ambulatory Visit: Payer: Self-pay | Admitting: Family Medicine

## 2014-01-18 DIAGNOSIS — J3489 Other specified disorders of nose and nasal sinuses: Secondary | ICD-10-CM

## 2014-01-21 ENCOUNTER — Other Ambulatory Visit: Payer: 59

## 2014-01-26 ENCOUNTER — Ambulatory Visit
Admission: RE | Admit: 2014-01-26 | Discharge: 2014-01-26 | Disposition: A | Discharge status: Home / Self Care | Payer: 59 | Source: Ambulatory Visit | Attending: Family Medicine | Admitting: Family Medicine

## 2014-01-26 DIAGNOSIS — J3489 Other specified disorders of nose and nasal sinuses: Secondary | ICD-10-CM

## 2014-03-29 ENCOUNTER — Other Ambulatory Visit: Payer: Self-pay | Admitting: Family Medicine

## 2014-03-29 DIAGNOSIS — M79601 Pain in right arm: Secondary | ICD-10-CM

## 2014-03-29 DIAGNOSIS — M542 Cervicalgia: Secondary | ICD-10-CM

## 2014-03-29 DIAGNOSIS — M79602 Pain in left arm: Secondary | ICD-10-CM

## 2014-04-06 ENCOUNTER — Ambulatory Visit
Admission: RE | Admit: 2014-04-06 | Discharge: 2014-04-06 | Disposition: A | Discharge status: Home / Self Care | Payer: 59 | Source: Ambulatory Visit | Attending: Family Medicine | Admitting: Family Medicine

## 2014-04-06 DIAGNOSIS — M79602 Pain in left arm: Secondary | ICD-10-CM

## 2014-04-06 DIAGNOSIS — M542 Cervicalgia: Secondary | ICD-10-CM

## 2014-04-06 DIAGNOSIS — M79601 Pain in right arm: Secondary | ICD-10-CM

## 2014-04-26 ENCOUNTER — Other Ambulatory Visit: Payer: Self-pay | Admitting: Family Medicine

## 2014-04-26 ENCOUNTER — Ambulatory Visit
Admission: RE | Admit: 2014-04-26 | Discharge: 2014-04-26 | Disposition: A | Discharge status: Home / Self Care | Payer: 59 | Source: Ambulatory Visit | Attending: Family Medicine | Admitting: Family Medicine

## 2014-04-26 DIAGNOSIS — R609 Edema, unspecified: Secondary | ICD-10-CM

## 2014-04-26 DIAGNOSIS — R52 Pain, unspecified: Secondary | ICD-10-CM

## 2014-04-27 ENCOUNTER — Other Ambulatory Visit: Payer: Self-pay | Admitting: Family Medicine

## 2014-04-27 DIAGNOSIS — M5412 Radiculopathy, cervical region: Secondary | ICD-10-CM

## 2014-04-30 ENCOUNTER — Ambulatory Visit
Admission: RE | Admit: 2014-04-30 | Discharge: 2014-04-30 | Disposition: A | Discharge status: Home / Self Care | Payer: 59 | Source: Ambulatory Visit | Attending: Family Medicine | Admitting: Family Medicine

## 2014-04-30 DIAGNOSIS — M5412 Radiculopathy, cervical region: Secondary | ICD-10-CM

## 2014-04-30 MED ORDER — IOHEXOL 300 MG/ML  SOLN
1.0000 mL | Freq: Once | INTRAMUSCULAR | Status: AC | PRN
Start: 2014-04-30 — End: 2014-04-30
  Administered 2014-04-30: 1 mL via EPIDURAL

## 2014-04-30 MED ORDER — TRIAMCINOLONE ACETONIDE 40 MG/ML IJ SUSP (RADIOLOGY)
60.0000 mg | Freq: Once | INTRAMUSCULAR | Status: AC
  Administered 2014-04-30: 60 mg via EPIDURAL

## 2014-04-30 NOTE — Discharge Instructions (Signed)

## 2014-05-10 ENCOUNTER — Ambulatory Visit (HOSPITAL_BASED_OUTPATIENT_CLINIC_OR_DEPARTMENT_OTHER): Payer: 59

## 2014-06-02 ENCOUNTER — Other Ambulatory Visit: Payer: Self-pay | Admitting: Family Medicine

## 2014-06-02 DIAGNOSIS — M5412 Radiculopathy, cervical region: Secondary | ICD-10-CM

## 2014-06-08 ENCOUNTER — Encounter (HOSPITAL_BASED_OUTPATIENT_CLINIC_OR_DEPARTMENT_OTHER): Payer: 59

## 2014-06-10 ENCOUNTER — Ambulatory Visit
Admission: RE | Admit: 2014-06-10 | Discharge: 2014-06-10 | Disposition: A | Discharge status: Home / Self Care | Payer: 59 | Source: Ambulatory Visit | Attending: Family Medicine | Admitting: Family Medicine

## 2014-06-10 DIAGNOSIS — M5412 Radiculopathy, cervical region: Secondary | ICD-10-CM

## 2014-06-10 MED ORDER — TRIAMCINOLONE ACETONIDE 40 MG/ML IJ SUSP (RADIOLOGY)
60.0000 mg | Freq: Once | INTRAMUSCULAR | Status: AC
  Administered 2014-06-10: 60 mg via EPIDURAL

## 2014-06-10 MED ORDER — IOHEXOL 300 MG/ML  SOLN
1.0000 mL | Freq: Once | INTRAMUSCULAR | Status: AC | PRN
  Administered 2014-06-10: 1 mL via EPIDURAL

## 2014-06-18 ENCOUNTER — Ambulatory Visit (HOSPITAL_BASED_OUTPATIENT_CLINIC_OR_DEPARTMENT_OTHER): Payer: 59

## 2014-08-02 ENCOUNTER — Other Ambulatory Visit: Payer: Self-pay | Admitting: Neurosurgery

## 2014-08-02 DIAGNOSIS — M5412 Radiculopathy, cervical region: Secondary | ICD-10-CM

## 2014-08-12 ENCOUNTER — Ambulatory Visit
Admission: RE | Admit: 2014-08-12 | Discharge: 2014-08-12 | Disposition: A | Discharge status: Home / Self Care | Payer: 59 | Source: Ambulatory Visit | Attending: Neurosurgery | Admitting: Neurosurgery

## 2014-08-12 DIAGNOSIS — M5412 Radiculopathy, cervical region: Secondary | ICD-10-CM

## 2014-08-12 MED ORDER — DIAZEPAM 5 MG PO TABS
10.0000 mg | ORAL_TABLET | Freq: Once | ORAL | Status: AC
  Administered 2014-08-12: 10 mg via ORAL

## 2014-08-12 MED ORDER — IOHEXOL 300 MG/ML  SOLN
10.0000 mL | Freq: Once | INTRAMUSCULAR | Status: AC | PRN
  Administered 2014-08-12: 10 mL via INTRATHECAL

## 2014-08-12 MED ORDER — ONDANSETRON HCL 4 MG/2ML IJ SOLN
4.0000 mg | Freq: Once | INTRAMUSCULAR | Status: AC
  Administered 2014-08-12: 4 mg via INTRAMUSCULAR

## 2014-08-12 MED ORDER — IOHEXOL 300 MG/ML  SOLN
75.0000 mL | Freq: Once | INTRAMUSCULAR | Status: AC | PRN
  Administered 2014-08-12: 75 mL via INTRAVENOUS

## 2014-08-12 MED ORDER — MEPERIDINE HCL 100 MG/ML IJ SOLN
100.0000 mg | Freq: Once | INTRAMUSCULAR | Status: AC
  Administered 2014-08-12: 100 mg via INTRAMUSCULAR

## 2014-08-12 NOTE — Discharge Instructions (Signed)
Myelogram Discharge Instructions  1. Go home and rest quietly for the next 24 hours.  It is important to lie flat for the next 24 hours.  Get up only to go to the restroom.  You may lie in the bed or on a couch on your back, your stomach, your left side or your right side.  You may have one pillow under your head.  You may have pillows between your knees while you are on your side or under your knees while you are on your back.  2. DO NOT drive today.  Recline the seat as far back as it will go, while still wearing your seat belt, on the way home.  3. You may get up to go to the bathroom as needed.  You may sit up for 10 minutes to eat.  You may resume your normal diet and medications unless otherwise indicated.  Drink lots of extra fluids today and tomorrow.  4. The incidence of headache, nausea, or vomiting is about 5% (one in 20 patients).  If you develop a headache, lie flat and drink plenty of fluids until the headache goes away.  Caffeinated beverages may be helpful.  If you develop severe nausea and vomiting or a headache that does not go away with flat bed rest, call [phone].  5. You may resume normal activities after your 24 hours of bed rest is over; however, do not exert yourself strongly or do any heavy lifting tomorrow. If when you get up you have a headache when standing, go back to bed and force fluids for another 24 hours.  6. Call your physician for a follow-up appointment.  The results of your myelogram will be sent directly to your physician by the following day.  7. If you have any questions or if complications develop after you arrive home, please call [phone].  Discharge instructions have been explained to the patient.  The patient, or the person responsible for the patient, fully understands these instructions.      May resume Lexapro on Feb. 12, 2016, after 9:30 am.

## 2014-08-12 NOTE — Progress Notes (Signed)
Patient states she has been off Lexapro for at least the past two days.  jkl

## 2014-08-24 ENCOUNTER — Ambulatory Visit: Payer: 59 | Admitting: Cardiovascular Disease

## 2014-08-25 ENCOUNTER — Encounter: Payer: Self-pay | Admitting: Cardiovascular Disease

## 2014-08-25 ENCOUNTER — Ambulatory Visit (INDEPENDENT_AMBULATORY_CARE_PROVIDER_SITE_OTHER): Payer: 59 | Admitting: Cardiovascular Disease

## 2014-08-25 VITALS — BP 130/70 | HR 95 | Ht 60.0 in | Wt 228.4 lb

## 2014-08-25 DIAGNOSIS — I4891 Unspecified atrial fibrillation: Secondary | ICD-10-CM

## 2014-08-25 DIAGNOSIS — E785 Hyperlipidemia, unspecified: Secondary | ICD-10-CM | POA: Insufficient documentation

## 2014-08-25 DIAGNOSIS — G4733 Obstructive sleep apnea (adult) (pediatric): Secondary | ICD-10-CM | POA: Insufficient documentation

## 2014-08-25 DIAGNOSIS — I10 Essential (primary) hypertension: Secondary | ICD-10-CM

## 2014-08-25 DIAGNOSIS — K219 Gastro-esophageal reflux disease without esophagitis: Secondary | ICD-10-CM

## 2014-08-25 NOTE — Progress Notes (Signed)
Patient ID: Katie Owens, female   DOB: [birth year], 64 y.o.   MRN: [id number]     Primary M.D.: Dr. Derinda Late Referring M.D.: Dr. Karie Chimera  HPI: Katie Owens is a 64 y.o. female who presents to the office today for a 2 year follow up cardiology evaluation and for preoperative clearance prior to undergoing neurosurgery by Dr. Hal Neer tomorrow.  Katie Owens has a history of paroxysmal atrial fibrillation, obesity, type 2 diabetes mellitus, fibromyalgia, hypertension, as well as depression.  She underwent successful atrial fibrillation ablation by Dr. Romeo Apple at [facility] in 2011 and has been maintaining sinus rhythm without recurrence.  She also has a history of moderately severe obstructive sleep apnea and has been on CPAP therapy.  Her sleep study was performed in 2010.  Her overall AHI was 28.4, but during REM sleep.  This was 47.4.  He was titrated up to a 16 cm water pressure.  She sees Dr. Derinda Late for primary care.  She tells me he checks laboratory almost every 3 months.  I have not seen her since 08/05/2012.  Over the past 2 years, she denies any episodes of chest tightness.  She has been on carvedilol 12.5 mg twice a day, chlorthalidone 25 mg daily.  Losartan 100 mg daily for hypertension.  She is diabetic on metformin 1000 g twice a day Tradjenta 5 mg every morning.  She is on atorvastatin 10 mg for hyperlipidemia.  She denies any episodes of chest pain.  Review of her record indicates that in July 2010 after an abnormal nuclear perfusion study, she underwent definitive cardiac catheterization which revealed normal LV function with an ejection fraction of 65%.  She had normal coronary arteries without obstructive disease.  She has developed progressive discomfort in her arms bilaterally.  She has evidence for multilevel cervical spondylosis with significant disc protrusion and/or uncinate spurring at C3-4, C4-5, C5-6, and C6-6-7.  The right-sided changes appear  greater at C4-5 and C5-6.  She is tentatively scheduled to undergo C4-5, C5-6, and C6-7 anterior cervical fusion on 08/26/2014, which is tomorrow to be done by Dr. Karie Chimera.  She presents to the office today for preoperative clearance.  Past Medical History  Diagnosis Date  . Hypertension   . Dysrhythmia     hx AF  . Depression   . Shortness of breath   . Asthma   . Sleep apnea     uses a cpap  . Diabetes mellitus without complication   . Chronic kidney disease     hx stones  . GERD (gastroesophageal reflux disease)   . Arthritis   . Fibromyalgia   . Cancer 1995    breast cancer lt with 12 nodes out  . Seasonal allergies   . Contact lens/glasses fitting     wears contacts or glasses  . Hyperlipemia   . PONV (postoperative nausea and vomiting)     Past Surgical History  Procedure Laterality Date  . Breast lumpectomy with axillary lymph node dissection  1995    12 nodes removed  . Diagnostic laparoscopy  1990  . Tonsillectomy    . Lumbar fusion    . Cardiac electrophysiology mapping and ablation  2012  . Colonoscopy    . Carpal tunnel release Right 10/14/2012    Procedure: CARPAL TUNNEL RELEASE;  Surgeon: Wynonia Sours, MD;  Location: [place];  Service: Orthopedics;  Laterality: Right;    Allergies  Allergen Reactions  . Aspirin  Hives  . Ibuprofen Hives  . Penicillins Other (See Comments)    Current Outpatient Prescriptions  Medication Sig Dispense Refill  . ALPRAZolam (XANAX) 0.5 MG tablet   1  . amitriptyline (ELAVIL) 10 MG tablet Take 10 mg by mouth at bedtime as needed. for sleep  1  . Ascorbic Acid (VITAMIN C) 1000 MG tablet Take 1,000 mg by mouth daily. Takes 2    . atorvastatin (LIPITOR) 10 MG tablet Take 10 mg by mouth daily.    [name] BIOTIN 5000 PO Take 1 tablet by mouth daily.    [name] CALCIUM PO Take 1 tablet by mouth daily.    . carvedilol (COREG) 12.5 MG tablet Take 12.5 mg by mouth every evening.    . chlorpheniramine (CHLOR-TRIMETON) 4  MG tablet Take 4 mg by mouth 2 (two) times daily as needed for allergies.    . chlorthalidone (HYGROTON) 25 MG tablet Take 25 mg by mouth daily.    . cholecalciferol (VITAMIN D) 1000 UNITS tablet Take 1,000 Units by mouth daily. Takes 5000    . co-enzyme Q-10 30 MG capsule Take 30 mg by mouth 3 (three) times daily.    . cyclobenzaprine (FLEXERIL) 10 MG tablet Take 10 mg by mouth at bedtime.    [name] escitalopram (LEXAPRO) 20 MG tablet Take 20 mg by mouth at bedtime.    [name] etodolac (LODINE) 400 MG tablet   7  . HYDROcodone-acetaminophen (NORCO) 5-325 MG per tablet Take 1 tablet by mouth every 6 (six) hours as needed for pain. 30 tablet 0  . loperamide (IMODIUM) 2 MG capsule Take 2 mg by mouth 4 (four) times daily as needed for diarrhea or loose stools.    [name] losartan (COZAAR) 100 MG tablet Take 100 mg by mouth daily.    . metFORMIN (GLUCOPHAGE) 1000 MG tablet Take 1,000 mg by mouth 2 (two) times daily with a meal.    . Multiple Vitamins-Minerals (MULTIVITAMIN ADULT PO) Take 1 tablet by mouth daily.    . Omega-3 Krill Oil 500 MG CAPS Take 1 tablet by mouth daily.    [name] omeprazole (PRILOSEC) 20 MG capsule Take 20 mg by mouth daily.    . potassium chloride SA (K-DUR,KLOR-CON) 20 MEQ tablet Take 20 mEq by mouth 2 (two) times daily.    . pregabalin (LYRICA) 75 MG capsule Take 75 mg by mouth 2 (two) times daily.    [name] pyridOXINE (VITAMIN B-6) 100 MG tablet Take 100 mg by mouth daily.    . TRADJENTA 5 MG TABS tablet Take 5 mg by mouth every morning.  0  . vitamin B-12 (CYANOCOBALAMIN) 1000 MCG tablet Take 1,000 mcg by mouth daily. 5000     No current facility-administered medications for this visit.    History   Social History  . Marital Status: Divorced    Spouse Name: N/A  . Number of Children: N/A  . Years of Education: N/A   Occupational History  . Not on file.   Social History Main Topics  . Smoking status: Former Smoker    Quit date: 10/08/1979  . Smokeless tobacco: Not on file  . Alcohol  Use: No  . Drug Use: No  . Sexual Activity: Not on file   Other Topics Concern  . Not on file   Social History Narrative    Family History  Problem Relation Age of Onset  . Stroke Mother    Both parents are deceased and died at age 58.  Her mother had a  history of hypertension, history of CVA, and ultimately died of pneumonia. The cause of her father's death is unknown.  ROS General: Negative; No fevers, chills, or night sweats HEENT: Negative; No changes in vision or hearing, sinus congestion, difficulty swallowing Pulmonary: Negative; No cough, wheezing, shortness of breath, hemoptysis Cardiovascular: See HPI: No chest pain, presyncope, syncope, palpitations GI: Negative; No nausea, vomiting, diarrhea, or abdominal pain GU: Negative; No dysuria, hematuria, or difficulty voiding Musculoskeletal: Negative; no myalgias, joint pain, or weakness Hematologic: Negative; no easy bruising, bleeding Endocrine: Negative; no heat/cold intolerance; no diabetes, Neuro: Positive for bilateral arm paresthesias, right greater than left, and left arm discomfort Skin: Negative; No rashes or skin lesions Psychiatric: Negative; No behavioral problems, depression Sleep: Positive for obstructive sleep apnea; No snoring,  daytime sleepiness, hypersomnolence, bruxism, restless legs, hypnogognic hallucinations. Other comprehensive 14 point system review is negative   Physical Exam BP 130/70 mmHg  Pulse 95  Ht 5' (1.524 m)  Wt 228 lb 6.4 oz (103.602 kg)  BMI 44.61 kg/m2  Body mass index is compatible with morbid obesity General: Alert, oriented, no distress.  Skin: normal turgor, no rashes, warm and dry HEENT: Normocephalic, atraumatic. Pupils equal round and reactive to light; sclera anicteric; extraocular muscles intact, No lid lag; Nose without nasal septal hypertrophy; Mouth/Parynx benign; Mallinpatti scale 3 Neck: No JVD, no carotid bruits; normal carotid upstroke Lungs: clear to  ausculatation and percussion bilaterally; no wheezing or rales, normal inspiratory and expiratory effort Chest wall: without tenderness to palpitation Heart: PMI not displaced, RRR, s1 s2 normal, 1/6systolic murmur, No diastolic murmur, no rubs, gallops, thrills, or heaves Abdomen: Moderate central adiposity; soft, nontender; no hepatosplenomehaly, BS+; abdominal aorta nontender and not dilated by palpation. Back: no CVA tenderness Pulses: 2+  Musculoskeletal: full range of motion, normal strength, no joint deformities Extremities: Pulses 2+, no clubbing cyanosis or edema, Homan's sign negative  Neurologic:  Cranial nerves grossly wnl Psychologic: Normal mood and affect   ECG (independently read by me): Normal sinus rhythm at 95 bpm.  PR interval 160 ms.  QTc interval 452 ms.  LABS:  BMP Latest Ref Rng 10/09/2012 11/02/2009 01/25/2009  Glucose 70 - 99 mg/dL 138(H) 125(H) 143(H)  BUN 6 - 23 mg/dL 13 11 15   Creatinine 0.50 - 1.10 mg/dL 0.79 0.76 0.73  Sodium 135 - 145 mEq/L 139 141 139  Potassium 3.5 - 5.1 mEq/L 3.7 4.6 4.1  Chloride 96 - 112 mEq/L 100 105 109  CO2 19 - 32 mEq/L 28 27 25   Calcium 8.4 - 10.5 mg/dL 9.7 9.9 9.6     Hepatic Function Latest Ref Rng 01/15/2009  Total Protein 6.0 - 8.3 g/dL 7.1  Albumin 3.5 - 5.2 g/dL 3.8  AST 0 - 37 U/L 23  ALT 0 - 35 U/L 20  Alk Phosphatase 39 - 117 U/L 62  Total Bilirubin 0.3 - 1.2 mg/dL 0.5     CBC Latest Ref Rng 10/14/2012 11/02/2009 01/25/2009  WBC 4.0 - 10.5 K/uL - 6.3 8.3  Hemoglobin 12.0 - 15.0 g/dL 13.5 13.4 11.3(L)  Hematocrit 36.0 - 46.0 % - 38.4 33.1(L)  Platelets 150 - 400 K/uL - 225 223     BNP No results found for: BNP  ProBNP    Component Value Date/Time   PROBNP 44.0 01/23/2009 2022     Lipid Panel     Component Value Date/Time   CHOL  01/24/2009 0420    140        ATP III CLASSIFICATION:  <  200     mg/dL   Desirable  200-239  mg/dL   Borderline High  >=240    mg/dL   High          TRIG 106 01/24/2009  0420   HDL 61 01/24/2009 0420   CHOLHDL 2.3 01/24/2009 0420   VLDL 21 01/24/2009 0420   LDLCALC  01/24/2009 0420    58        Total Cholesterol/HDL:CHD Risk Coronary Heart Disease Risk Table                     Men   Women  1/2 Average Risk   3.4   3.3  Average Risk       5.0   4.4  2 X Average Risk   9.6   7.1  3 X Average Risk  23.4   11.0        Use the calculated Patient Ratio above and the CHD Risk Table to determine the patient's CHD Risk.        ATP III CLASSIFICATION (LDL):  <100     mg/dL   Optimal  100-129  mg/dL   Near or Above                    Optimal  130-159  mg/dL   Borderline  160-189  mg/dL   High  >190     mg/dL   Very High     RADIOLOGY: Ct Cervical Spine W Contrast  08/12/2014   CLINICAL DATA:  Neck pain with RIGHT greater than LEFT arm pain becoming progressively worse over the past 6 months.  FLUOROSCOPY TIME:  1 minutes and 39 seconds.  PROCEDURE: LUMBAR PUNCTURE FOR CERVICAL MYELOGRAM  After thorough discussion of risks and benefits of the procedure including bleeding, infection, injury to nerves, blood vessels, adjacent structures as well as headache and CSF leak, written and oral informed consent was obtained. Consent was obtained by Dr. Rolla Flatten. We discussed the high likelihood of obtaining a diagnostic study.  Patient was positioned prone on the fluoroscopy table. Local anesthesia was provided with 1% lidocaine without epinephrine after prepped and draped in the usual sterile fashion. Puncture was performed at L3-4 using a 3 1/2 inch 22-gauge spinal needle via LEFT paramedian approach. Using a single pass through the dura, the needle was placed within the thecal sac, with return of clear CSF. 10 mL of Omnipaque-300 was injected into the thecal sac, with normal opacification of the nerve roots and cauda equina consistent with free flow within the subarachnoid space. The patient was then moved to the trendelenburg position and contrast flowed into the  Cervical spine region.  I personally performed the lumbar puncture and administered the intrathecal contrast. I also personally supervised acquisition of the myelogram images.  TECHNIQUE: Contiguous axial images were obtained through the Cervical spine after the intrathecal infusion of infusion. Coronal and sagittal reconstructions were obtained of the axial image sets.  FINDINGS: CERVICAL MYELOGRAM FINDINGS:  Good opacification of the cervical subarachnoid space. Moderate disc space narrowing at C4-5, C6-7, and C7-T1. Mild disc space narrowing at C3-4. Shallow ventral defects at multiple levels, greatest at the C3-4 level. There is effacement of the RIGHT C5, RIGHT C6 and LEFT C7 nerve roots.  CT CERVICAL MYELOGRAM FINDINGS:  No prevertebral or paraspinous masses. Airway midline. Mild vascular calcification. No tonsillar herniation. No lung apex lesion.  The individual disc spaces were examined as follows:  C2-3:  Normal.  C3-4: Central and rightward extrusion. Canal diameter 7 mm. No definite C4 nerve root impingement.  C4-5: Disc space narrowing with central disc osteophyte complex. RIGHT greater than LEFT uncinate spurring. Possible superimposed soft protrusion on the RIGHT. RIGHT C5 nerve root impingement.  C5-6: Far lateral protrusion on the RIGHT. RIGHT C6 nerve root encroachment is likely. No canal stenosis or significant uncinate spurring.  C6-7: Advanced disc space narrowing with endplate reactive changes. LEFT-sided uncinate spurring narrows the foramen. LEFT C7 nerve root impingement is noted.  C7-T1:  Unremarkable disc space.  Mild facet arthropathy.  Incidental note is made of a central protrusion at T2-T3 without cord compression.  Compared with prior MR from 04/06/2014 the appearance is similar.  IMPRESSION: Multilevel spondylosis as described.  Significant disc protrusions and/or uncinate spurring are observed at C3-4, C4-5, C5-6, and C6-7.  With regard to the current symptomatology, the RIGHT-sided  changes appear greater at C4-5 and C5-6.   Electronically Signed   By: Rolla Flatten M.D.   On: 08/12/2014 12:36   Dg Myelography Lumbar Inj Cervical  08/12/2014   CLINICAL DATA:  Neck pain with RIGHT greater than LEFT arm pain becoming progressively worse over the past 6 months.  FLUOROSCOPY TIME:  1 minutes and 39 seconds.  PROCEDURE: LUMBAR PUNCTURE FOR CERVICAL MYELOGRAM  After thorough discussion of risks and benefits of the procedure including bleeding, infection, injury to nerves, blood vessels, adjacent structures as well as headache and CSF leak, written and oral informed consent was obtained. Consent was obtained by Dr. Rolla Flatten. We discussed the high likelihood of obtaining a diagnostic study.  Patient was positioned prone on the fluoroscopy table. Local anesthesia was provided with 1% lidocaine without epinephrine after prepped and draped in the usual sterile fashion. Puncture was performed at L3-4 using a 3 1/2 inch 22-gauge spinal needle via LEFT paramedian approach. Using a single pass through the dura, the needle was placed within the thecal sac, with return of clear CSF. 10 mL of Omnipaque-300 was injected into the thecal sac, with normal opacification of the nerve roots and cauda equina consistent with free flow within the subarachnoid space. The patient was then moved to the trendelenburg position and contrast flowed into the Cervical spine region.  I personally performed the lumbar puncture and administered the intrathecal contrast. I also personally supervised acquisition of the myelogram images.  TECHNIQUE: Contiguous axial images were obtained through the Cervical spine after the intrathecal infusion of infusion. Coronal and sagittal reconstructions were obtained of the axial image sets.  FINDINGS: CERVICAL MYELOGRAM FINDINGS:  Good opacification of the cervical subarachnoid space. Moderate disc space narrowing at C4-5, C6-7, and C7-T1. Mild disc space narrowing at C3-4. Shallow ventral  defects at multiple levels, greatest at the C3-4 level. There is effacement of the RIGHT C5, RIGHT C6 and LEFT C7 nerve roots.  CT CERVICAL MYELOGRAM FINDINGS:  No prevertebral or paraspinous masses. Airway midline. Mild vascular calcification. No tonsillar herniation. No lung apex lesion.  The individual disc spaces were examined as follows:  C2-3:  Normal.  C3-4: Central and rightward extrusion. Canal diameter 7 mm. No definite C4 nerve root impingement.  C4-5: Disc space narrowing with central disc osteophyte complex. RIGHT greater than LEFT uncinate spurring. Possible superimposed soft protrusion on the RIGHT. RIGHT C5 nerve root impingement.  C5-6: Far lateral protrusion on the RIGHT. RIGHT C6 nerve root encroachment is likely. No canal stenosis or significant uncinate spurring.  C6-7: Advanced disc space narrowing with endplate reactive changes.  LEFT-sided uncinate spurring narrows the foramen. LEFT C7 nerve root impingement is noted.  C7-T1:  Unremarkable disc space.  Mild facet arthropathy.  Incidental note is made of a central protrusion at T2-T3 without cord compression.  Compared with prior MR from 04/06/2014 the appearance is similar.  IMPRESSION: Multilevel spondylosis as described.  Significant disc protrusions and/or uncinate spurring are observed at C3-4, C4-5, C5-6, and C6-7.  With regard to the current symptomatology, the RIGHT-sided changes appear greater at C4-5 and C5-6.   Electronically Signed   By: Rolla Flatten M.D.   On: 08/12/2014 12:36      ASSESSMENT AND PLAN: [name] is a 64 year old female who has a history of morbid obesity, hypertension, type 2 diabetes mellitus, GERD, moderately severe obstructive sleep apnea, who is developed progressive multilevel spondylosis with significant disc protrusions leading to neurologic symptoms involving C3-4, C4-5, C5-6, and C6-7.  She is tentatively scheduled to undergo surgery with anterior cervical fusion tomorrow by Dr. Hal Neer.   Prior cardiac catheterization performed in 2010 after she had an abnormal nuclear stress test revealed no significant obstructive CAD.  She is morbidly obese.  She is status post atrial fibrillation ablation, which was done at [facility] by Dr. Lehman Prom, and is maintaining normal sinus rhythm without awareness of recurrent arrhythmia.  She has hyperlipidemia on atorvastatin and has a target LDL goal less than 70 with her diabetes mellitus.  Clinically, she seems stable to undergo planned neurosurgery tomorrow.  She has laboratory obtained by Dr. Sandi Mariscal as part of his VIP service.  I will try to retain results of recent laboratory.  I discussed with her the importance of weight loss and increased exercise.  She ultimately should have a subsequent download to make certain she is still adequately treated with reference to obstructive sleep apnea.  As long as she remains stable, I will see her in one year for cardiology evaluation.   Troy Sine, MD, [facility]  08/25/2014 6:26 PM

## 2014-08-25 NOTE — Patient Instructions (Signed)
Your physician wants you to follow-up in: 1 year or sooner if needed with Dr. Claiborne Billings.You will receive a reminder letter in the mail two months in advance. If you don't receive a letter, please call our office to schedule the follow-up appointment.  Your clearance for surgery has been faxed to [facility].

## 2014-11-30 ENCOUNTER — Ambulatory Visit
Admission: RE | Admit: 2014-11-30 | Discharge: 2014-11-30 | Disposition: A | Discharge status: Home / Self Care | Payer: 59 | Source: Ambulatory Visit | Attending: Family Medicine | Admitting: Family Medicine

## 2014-11-30 ENCOUNTER — Other Ambulatory Visit: Payer: Self-pay | Admitting: Family Medicine

## 2014-11-30 DIAGNOSIS — M25512 Pain in left shoulder: Secondary | ICD-10-CM

## 2014-12-21 ENCOUNTER — Other Ambulatory Visit: Payer: Self-pay | Admitting: Orthopedic Surgery

## 2014-12-21 DIAGNOSIS — R52 Pain, unspecified: Secondary | ICD-10-CM

## 2014-12-21 DIAGNOSIS — R609 Edema, unspecified: Secondary | ICD-10-CM

## 2014-12-21 DIAGNOSIS — R531 Weakness: Secondary | ICD-10-CM

## 2014-12-25 ENCOUNTER — Ambulatory Visit
Admission: RE | Admit: 2014-12-25 | Discharge: 2014-12-25 | Disposition: A | Discharge status: Home / Self Care | Payer: 59 | Source: Ambulatory Visit | Attending: Orthopedic Surgery | Admitting: Orthopedic Surgery

## 2014-12-25 DIAGNOSIS — R531 Weakness: Secondary | ICD-10-CM

## 2014-12-25 DIAGNOSIS — R609 Edema, unspecified: Secondary | ICD-10-CM

## 2014-12-25 DIAGNOSIS — R52 Pain, unspecified: Secondary | ICD-10-CM

## 2014-12-26 ENCOUNTER — Ambulatory Visit
Admission: RE | Admit: 2014-12-26 | Discharge: 2014-12-26 | Disposition: A | Discharge status: Home / Self Care | Payer: 59 | Source: Ambulatory Visit | Attending: Orthopedic Surgery | Admitting: Orthopedic Surgery

## 2015-01-10 ENCOUNTER — Encounter: Payer: Self-pay | Admitting: Cardiology

## 2015-01-21 ENCOUNTER — Telehealth: Payer: Self-pay | Admitting: Cardiovascular Disease

## 2015-01-21 NOTE — Telephone Encounter (Signed)
Lauren (Dr.Peter [place] office ) is calling because Ms.Zuver is in Afib and almost passed out and is wanting her to seen as soon as possible . Please call Lauren back at [phone].[name]   Thanks

## 2015-01-21 NOTE — Telephone Encounter (Signed)
Left message for pt to return my call.

## 2015-01-21 NOTE — Telephone Encounter (Signed)
  Seen by cardiology 08/25/14,  Hx PAF w/ ablation   She called her primary care office, staff took note of concern and reached out to our office to look into, see if she can be seen sooner than routine OV w/ Dr. Claiborne Billings.   I informed nurse at Dr. Deboraha Sprang office that I would reach out to patient.  Spoke to patient. She had ablatation at [facility] in 2011 - had been referred by Dr. Claiborne Billings at that time. Ablation successful and she notes no problems since w/e of 2 episodes of A Fib. -One episode was over a year ago she thinks, 1 most recently 2 nights ago that she states "lasted for about 10 seconds". She also notes she "came really close to passing out", which was her main concern. Unsure if she is having episodes that are not noticeable. She is only daily coreg.  Informed pt I would defer to A Fib clinic, see if eval there recommended. Pt voiced agreement w/ plan. Instructed to call back if new symptoms or return of symptoms.

## 2015-01-21 NOTE — Telephone Encounter (Signed)
Lauren is asking that you give her a call back she has more questions for you

## 2015-01-26 NOTE — Telephone Encounter (Signed)
Left 2nd message asking patient to return my call.

## 2015-01-27 NOTE — Telephone Encounter (Signed)
appt made for 8/3

## 2015-02-02 ENCOUNTER — Inpatient Hospital Stay (HOSPITAL_COMMUNITY): Admission: RE | Admit: 2015-02-02 | Payer: 59 | Source: Ambulatory Visit | Admitting: Nurse Practitioner

## 2015-02-04 ENCOUNTER — Ambulatory Visit (HOSPITAL_COMMUNITY)
Admission: RE | Admit: 2015-02-04 | Discharge: 2015-02-04 | Disposition: A | Discharge status: Home / Self Care | Payer: 59 | Source: Ambulatory Visit | Attending: Nurse Practitioner | Admitting: Nurse Practitioner

## 2015-02-04 ENCOUNTER — Other Ambulatory Visit (HOSPITAL_COMMUNITY): Payer: Self-pay | Admitting: *Deleted

## 2015-02-04 ENCOUNTER — Encounter (HOSPITAL_COMMUNITY): Payer: Self-pay | Admitting: Nurse Practitioner

## 2015-02-04 ENCOUNTER — Other Ambulatory Visit: Payer: Self-pay

## 2015-02-04 VITALS — BP 160/82 | HR 88 | Ht 60.0 in | Wt 234.0 lb

## 2015-02-04 DIAGNOSIS — N189 Chronic kidney disease, unspecified: Secondary | ICD-10-CM | POA: Diagnosis not present

## 2015-02-04 DIAGNOSIS — Z79899 Other long term (current) drug therapy: Secondary | ICD-10-CM | POA: Insufficient documentation

## 2015-02-04 DIAGNOSIS — I4891 Unspecified atrial fibrillation: Secondary | ICD-10-CM | POA: Insufficient documentation

## 2015-02-04 DIAGNOSIS — Z7902 Long term (current) use of antithrombotics/antiplatelets: Secondary | ICD-10-CM | POA: Insufficient documentation

## 2015-02-04 DIAGNOSIS — Z87891 Personal history of nicotine dependence: Secondary | ICD-10-CM | POA: Insufficient documentation

## 2015-02-04 DIAGNOSIS — I129 Hypertensive chronic kidney disease with stage 1 through stage 4 chronic kidney disease, or unspecified chronic kidney disease: Secondary | ICD-10-CM | POA: Diagnosis not present

## 2015-02-04 DIAGNOSIS — K219 Gastro-esophageal reflux disease without esophagitis: Secondary | ICD-10-CM | POA: Diagnosis not present

## 2015-02-04 DIAGNOSIS — M797 Fibromyalgia: Secondary | ICD-10-CM | POA: Diagnosis not present

## 2015-02-04 DIAGNOSIS — G4733 Obstructive sleep apnea (adult) (pediatric): Secondary | ICD-10-CM | POA: Insufficient documentation

## 2015-02-04 DIAGNOSIS — E669 Obesity, unspecified: Secondary | ICD-10-CM | POA: Diagnosis not present

## 2015-02-04 DIAGNOSIS — Z880 Allergy status to penicillin: Secondary | ICD-10-CM | POA: Diagnosis not present

## 2015-02-04 DIAGNOSIS — E1122 Type 2 diabetes mellitus with diabetic chronic kidney disease: Secondary | ICD-10-CM | POA: Diagnosis not present

## 2015-02-04 DIAGNOSIS — E785 Hyperlipidemia, unspecified: Secondary | ICD-10-CM | POA: Diagnosis not present

## 2015-02-04 DIAGNOSIS — Z8249 Family history of ischemic heart disease and other diseases of the circulatory system: Secondary | ICD-10-CM | POA: Diagnosis not present

## 2015-02-04 DIAGNOSIS — Z6841 Body Mass Index (BMI) 40.0 and over, adult: Secondary | ICD-10-CM | POA: Diagnosis not present

## 2015-02-04 MED ORDER — RIVAROXABAN 20 MG PO TABS: 20.0000 mg | ORAL_TABLET | Freq: Every day | ORAL | Status: DC

## 2015-02-04 MED ORDER — DILTIAZEM HCL 30 MG PO TABS: ORAL_TABLET | ORAL | Status: DC

## 2015-02-04 NOTE — Addendum Note (Signed)
Encounter addended by: Sherran Needs, NP on: 02/04/2015  9:56 AM<BR>     Documentation filed: Follow-up Section, LOS Section

## 2015-02-04 NOTE — Progress Notes (Signed)
Patient ID: Katie Owens, female   DOB: Jul 22, 1950, 64 y.o.   MRN: [id number]    Primary Care Physician: Dr. Sandi Mariscal Primary Cardiologist:Dr. Claiborne Billings Electrophysiologist: Dr. Magdalene Patricia [facility] is a 64 y.o. female with a h/o HTN, DM ,obesity, OSA not using cpap, persistent afib that had first onset in 2010 and ablation at [facility] with Dr. Lehman Prom in 2012. She was on warfarin at that time. She has done well in the interim without any issues with afib but the other night was on the phone and had several minutes of rapid heart beat with sensation of  presyncope. This is how she felt with her previous episodes of afib. She is concerned because she begged Dr. Lehman Prom to let her stop warfarin after the ablation  which he did grudgingly, but is now concerned re her risk of stroke with a chadsvsasc score of at least 3, with recent return of irregular heart beat. She does not have a bleeding history but does take lodine daily due to fibromyalgia, and has tried to stop in the past with significant return of pain. She is aware of the extra bleeding risk taking NSAID with DOAC, but has taken for years without any stomach/bleeding issues and wuld like to to restart blood thinner. Bmet reviewed from her PCP's office and Crcl calculated at 142.52ml/min.   Today, she denies symptoms of palpitations, chest pain, shortness of breath, orthopnea, PND, lower extremity edema, dizziness, presyncope, syncope, or neurologic sequela. The patient is tolerating medications without difficulties and is otherwise without complaint today.   Past Medical History  Diagnosis Date  . Hypertension   . Dysrhythmia     hx AF  . Depression   . Shortness of breath   . Asthma   . Sleep apnea     uses a cpap  . Diabetes mellitus without complication   . Chronic kidney disease     hx stones  . GERD (gastroesophageal reflux disease)   . Arthritis   . Fibromyalgia   . Cancer 1995    breast cancer lt with 12 nodes out  . Seasonal  allergies   . Contact lens/glasses fitting     wears contacts or glasses  . Hyperlipemia   . PONV (postoperative nausea and vomiting)   . IBS (irritable bowel syndrome)    Past Surgical History  Procedure Laterality Date  . Breast lumpectomy with axillary lymph node dissection  1995    12 nodes removed  . Diagnostic laparoscopy  1990  . Tonsillectomy    . Lumbar fusion    . Cardiac electrophysiology mapping and ablation  2012  . Colonoscopy    . Carpal tunnel release Right 10/14/2012    Procedure: CARPAL TUNNEL RELEASE;  Surgeon: Wynonia Sours, MD;  Location: [place];  Service: Orthopedics;  Laterality: Right;  . Cardiac catheterization  01/24/2009    left heart    Current Outpatient Prescriptions  Medication Sig Dispense Refill  . Ascorbic Acid (VITAMIN C) 1000 MG tablet Take 1,000 mg by mouth daily. Takes 2    . atorvastatin (LIPITOR) 10 MG tablet Take 10 mg by mouth daily.    . beclomethasone (QVAR) 80 MCG/ACT inhaler Inhale 2 puffs into the lungs daily.    [name] BIOTIN 5000 PO Take 1 tablet by mouth daily.    [name] CALCIUM PO Take 1 tablet by mouth daily.    . carvedilol (COREG) 12.5 MG tablet Take 12.5 mg by mouth 2 (two) times  daily.     . chlorpheniramine (CHLOR-TRIMETON) 4 MG tablet Take 4 mg by mouth 2 (two) times daily as needed for allergies.    . chlorthalidone (HYGROTON) 50 MG tablet Take 50 mg by mouth every morning.  1  . cholecalciferol (VITAMIN D) 1000 UNITS tablet Take 1,000 Units by mouth daily. Takes 5000    . co-enzyme Q-10 30 MG capsule Take 30 mg by mouth 3 (three) times daily.    . cyclobenzaprine (FLEXERIL) 10 MG tablet Take 10 mg by mouth at bedtime.    [name] escitalopram (LEXAPRO) 20 MG tablet Take 20 mg by mouth at bedtime.    [name] etodolac (LODINE) 400 MG tablet   7  . losartan (COZAAR) 100 MG tablet Take 100 mg by mouth daily.    . metFORMIN (GLUCOPHAGE) 1000 MG tablet Take 1,000 mg by mouth 2 (two) times daily with a meal.    . Multiple  Vitamins-Minerals (MULTIVITAMIN ADULT PO) Take 1 tablet by mouth daily.    . Omega-3 Krill Oil 500 MG CAPS Take 5,000 mg by mouth daily.     [name] omeprazole (PRILOSEC) 20 MG capsule Take 20 mg by mouth daily.    . potassium chloride SA (K-DUR,KLOR-CON) 20 MEQ tablet Take 20 mEq by mouth 2 (two) times daily.    . pregabalin (LYRICA) 75 MG capsule Take 75 mg by mouth 2 (two) times daily.    [name] pyridOXINE (VITAMIN B-6) 100 MG tablet Take 100 mg by mouth daily.    . TRADJENTA 5 MG TABS tablet Take 5 mg by mouth every morning.  0  . VENTOLIN HFA 108 (90 BASE) MCG/ACT inhaler Take 1-2 puffs by mouth as needed.  3  . vitamin B-12 (CYANOCOBALAMIN) 1000 MCG tablet Take 1,000 mcg by mouth daily. 5000    . ALPRAZolam (XANAX) 0.5 MG tablet   1  . diltiazem (CARDIZEM) 30 MG tablet Take 1 tablet every 4 hours AS NEEDED for HR>100 and BP>100 30 tablet 1  . HYDROcodone-acetaminophen (NORCO) 5-325 MG per tablet Take 1 tablet by mouth every 6 (six) hours as needed for pain. (Patient not taking: Reported on 02/04/2015) 30 tablet 0  . loperamide (IMODIUM) 2 MG capsule Take 2 mg by mouth 4 (four) times daily as needed for diarrhea or loose stools.    . rivaroxaban (XARELTO) 20 MG TABS tablet Take 1 tablet (20 mg total) by mouth daily with supper. 30 tablet 6   No current facility-administered medications for this encounter.    Allergies  Allergen Reactions  . Aspirin Hives  . Ibuprofen Hives  . Penicillins Other (See Comments)    History   Social History  . Marital Status: Divorced    Spouse Name: N/A  . Number of Children: N/A  . Years of Education: N/A   Occupational History  . Not on file.   Social History Main Topics  . Smoking status: Former Smoker -- 2.00 packs/day for 15 years    Types: Cigarettes    Quit date: 10/08/1979  . Smokeless tobacco: Never Used  . Alcohol Use: No  . Drug Use: No  . Sexual Activity: Not on file   Other Topics Concern  . Not on file   Social History Narrative     Family History  Problem Relation Age of Onset  . Stroke Mother   . Hypertension Mother   . Alcoholism Mother   . Alcoholism Father   . Alcoholism Brother   . Hypertension Brother   . Hyperlipidemia  Brother   . Anemia Brother   . Other Brother     bleeding hemorroids  . Heart disease Brother     valve surgery  . Heart attack Maternal Grandmother     heart valve surgery  . Hyperlipidemia Maternal Grandmother   . Hypertension Maternal Grandmother     ROS- All systems are reviewed and negative except as per the HPI above Records reviewed from PCP Bmet 01/06/15 creat 0.67, BUN 20, NA 137, Kt 3.6   Physical Exam: Filed Vitals:   02/04/15 0840  BP: 160/82  Pulse: 88  Height: 5' (1.524 m)  Weight: 234 lb (106.142 kg)    GEN- The patient is well appearing, alert and oriented x 3 today.   Head- normocephalic, atraumatic Eyes-  Sclera clear, conjunctiva pink Ears- hearing intact Oropharynx- clear Neck- supple, no JVP Lymph- no cervical lymphadenopathy Lungs- Clear to ausculation bilaterally, normal work of breathing Heart- Regular rate and rhythm, no murmurs, rubs or gallops, PMI not laterally displaced GI- soft, NT, ND, + BS Extremities- no clubbing, cyanosis, or edema MS- no significant deformity or atrophy Skin- no rash or lesion Psych- euthymic mood, full affect Neuro- strength and sensation are intact  EKG- NSR, normal EKG  Assessment and Plan:  1. H/O afib s/p abaltion 3012 Maintaining SR but had a  few mins of rapid heartbeat associated with presyncope similar to afib episodes in the past. Chadsvasc score of at least 3 and is concerned re stroke risk. She wants to go on  blood thinner. Did well on coumadin without bleeding issues but got tired of lab draws and frequent dose adjustments. I am concerned taking with daily lodine but she is aware of bleeding risks and wants to proceed, bleeding precautions reviewed. After discussion of available DOAC's she would  like to try Xarelto 20 mg daily. Will give Cardizem 30 mg to take one every 4 hours as a pill in pocket if develops sustained rarpid heartbeat.  2. HTN Eleveated today, pt states PCP just increased chlorthalidone. She states usually runs better than  Today.  F/u in one month for adjustment to blood thinner and any increase in afib burden. If sustained afib should develop she will need f/u with Dr. Lucilla Edin.   Geroge Baseman [place], [facility] [address] [place], [place] [address] [phone]

## 2015-02-04 NOTE — Patient Instructions (Signed)
Your physician has recommended you make the following change in your medication:  1)Xarelto 20mg  take once a day at supper 2)cardizem 30mg  -- take 1 tablet every 4 hours AS NEEDED for heart rate >100 and blood pressure >100  Parking code 0090 for september

## 2015-03-11 ENCOUNTER — Encounter (HOSPITAL_COMMUNITY): Payer: Self-pay | Admitting: Nurse Practitioner

## 2015-03-11 ENCOUNTER — Ambulatory Visit (HOSPITAL_COMMUNITY)
Admission: RE | Admit: 2015-03-11 | Discharge: 2015-03-11 | Disposition: A | Discharge status: Home / Self Care | Payer: 59 | Source: Ambulatory Visit | Attending: Nurse Practitioner | Admitting: Nurse Practitioner

## 2015-03-11 VITALS — BP 138/76 | HR 88 | Ht 60.0 in | Wt 236.0 lb

## 2015-03-11 DIAGNOSIS — M797 Fibromyalgia: Secondary | ICD-10-CM | POA: Insufficient documentation

## 2015-03-11 DIAGNOSIS — I4891 Unspecified atrial fibrillation: Secondary | ICD-10-CM

## 2015-03-11 DIAGNOSIS — G4733 Obstructive sleep apnea (adult) (pediatric): Secondary | ICD-10-CM | POA: Insufficient documentation

## 2015-03-11 DIAGNOSIS — E785 Hyperlipidemia, unspecified: Secondary | ICD-10-CM | POA: Insufficient documentation

## 2015-03-11 DIAGNOSIS — I129 Hypertensive chronic kidney disease with stage 1 through stage 4 chronic kidney disease, or unspecified chronic kidney disease: Secondary | ICD-10-CM | POA: Diagnosis not present

## 2015-03-11 DIAGNOSIS — Z87891 Personal history of nicotine dependence: Secondary | ICD-10-CM | POA: Insufficient documentation

## 2015-03-11 DIAGNOSIS — F329 Major depressive disorder, single episode, unspecified: Secondary | ICD-10-CM | POA: Diagnosis not present

## 2015-03-11 DIAGNOSIS — E1122 Type 2 diabetes mellitus with diabetic chronic kidney disease: Secondary | ICD-10-CM | POA: Insufficient documentation

## 2015-03-11 DIAGNOSIS — K219 Gastro-esophageal reflux disease without esophagitis: Secondary | ICD-10-CM | POA: Insufficient documentation

## 2015-03-11 DIAGNOSIS — Z79899 Other long term (current) drug therapy: Secondary | ICD-10-CM | POA: Diagnosis not present

## 2015-03-11 DIAGNOSIS — Z823 Family history of stroke: Secondary | ICD-10-CM | POA: Insufficient documentation

## 2015-03-11 DIAGNOSIS — Z880 Allergy status to penicillin: Secondary | ICD-10-CM | POA: Diagnosis not present

## 2015-03-11 DIAGNOSIS — J45909 Unspecified asthma, uncomplicated: Secondary | ICD-10-CM | POA: Insufficient documentation

## 2015-03-11 DIAGNOSIS — Z853 Personal history of malignant neoplasm of breast: Secondary | ICD-10-CM | POA: Diagnosis not present

## 2015-03-11 DIAGNOSIS — Z8249 Family history of ischemic heart disease and other diseases of the circulatory system: Secondary | ICD-10-CM | POA: Insufficient documentation

## 2015-03-11 DIAGNOSIS — N189 Chronic kidney disease, unspecified: Secondary | ICD-10-CM | POA: Insufficient documentation

## 2015-03-11 NOTE — Progress Notes (Signed)
Patient ID: [facility], female   DOB: 04-03-1951, 64 y.o.   MRN: [id number]      Primary Care Physician: Dr. Sandi Owens Primary Cardiologist:Dr. Claiborne Owens Electrophysiologist: Dr. Magdalene Patricia BRIANE Owens is a 64 y.o. female with a h/o HTN, DM ,obesity, OSA not using cpap, persistent afib that had first onset in 2010 and ablation at [facility] with Dr. Lehman [facility] in 2012. She was on warfarin at that time. She has done well in the interim without any issues with afib but the other night was on the phone and had several minutes of rapid heart beat with sensation of  presyncope. This is how she felt with her previous episodes of afib. She is concerned because she begged Dr. Lehman Owens to let her stop warfarin after the ablation  which he did grudgingly, but is now concerned re her risk of stroke with a chadsvsasc score of at least 3, with recent return of irregular heart beat. She does not have a bleeding history but does take lodine daily due to fibromyalgia, and has tried to stop in the past with significant return of pain. She is aware of the extra bleeding risk taking NSAID with DOAC, but has taken for years without any stomach/bleeding issues and wuld like to to restart blood thinner. Bmet reviewed from her PCP's office and Crcl calculated at 142.38ml/min.  She returns to afib clinic today and feels well. She has done well on Xarelto without any bleeding issues. Is continuing NSAID use, states her pain level without drug is not bearable. She was cautioned again re use of NSAIDS and DOAC for bleeding risk. She is aware and accepts risk. She is pending shoulder surgery next week for bone spurs and will stop DOAC for 2-3 days prior to surgery via PharmD recommendations.Kidney function normal.  Today, she denies symptoms of palpitations, chest pain, shortness of breath, orthopnea, PND, lower extremity edema, dizziness, presyncope, syncope, or neurologic sequela. The patient is tolerating medications without difficulties  and is otherwise without complaint today.   Past Medical History  Diagnosis Date  . Hypertension   . Dysrhythmia     hx AF  . Depression   . Shortness of breath   . Asthma   . Sleep apnea     uses a cpap  . Diabetes mellitus without complication   . Chronic kidney disease     hx stones  . GERD (gastroesophageal reflux disease)   . Arthritis   . Fibromyalgia   . Cancer 1995    breast cancer lt with 12 nodes out  . Seasonal allergies   . Contact lens/glasses fitting     wears contacts or glasses  . Hyperlipemia   . PONV (postoperative nausea and vomiting)   . IBS (irritable bowel syndrome)    Past Surgical History  Procedure Laterality Date  . Breast lumpectomy with axillary lymph node dissection  1995    12 nodes removed  . Diagnostic laparoscopy  1990  . Tonsillectomy    . Lumbar fusion    . Cardiac electrophysiology mapping and ablation  2012  . Colonoscopy    . Carpal tunnel release Right 10/14/2012    Procedure: CARPAL TUNNEL RELEASE;  Surgeon: Katie Sours, MD;  Location: [place];  Service: Orthopedics;  Laterality: Right;  . Cardiac catheterization  01/24/2009    left heart    Current Outpatient Prescriptions  Medication Sig Dispense Refill  . ALPRAZolam (XANAX) 0.5 MG tablet   1  .  Ascorbic Acid (VITAMIN C) 1000 MG tablet Take 1,000 mg by mouth daily. Takes 2    . atorvastatin (LIPITOR) 10 MG tablet Take 10 mg by mouth daily.    . beclomethasone (QVAR) 80 MCG/ACT inhaler Inhale 2 puffs into the lungs daily.    [name] BIOTIN 5000 PO Take 1 tablet by mouth daily.    [name] CALCIUM PO Take 1 tablet by mouth daily.    . carvedilol (COREG) 12.5 MG tablet Take 12.5 mg by mouth 2 (two) times daily.     . chlorpheniramine (CHLOR-TRIMETON) 4 MG tablet Take 4 mg by mouth 2 (two) times daily as needed for allergies.    . chlorthalidone (HYGROTON) 50 MG tablet Take 50 mg by mouth every morning.  1  . cholecalciferol (VITAMIN D) 1000 UNITS tablet Take 1,000 Units  by mouth daily. Takes 5000    . co-enzyme Q-10 30 MG capsule Take 30 mg by mouth 3 (three) times daily.    . cyclobenzaprine (FLEXERIL) 10 MG tablet Take 10 mg by mouth at bedtime.    [name] diltiazem (CARDIZEM) 30 MG tablet Take 1 tablet every 4 hours AS NEEDED for HR>100 and BP>100 30 tablet 1  . escitalopram (LEXAPRO) 20 MG tablet Take 20 mg by mouth at bedtime.    [name] etodolac (LODINE) 400 MG tablet   7  . HYDROcodone-acetaminophen (NORCO) 5-325 MG per tablet Take 1 tablet by mouth every 6 (six) hours as needed for pain. 30 tablet 0  . loperamide (IMODIUM) 2 MG capsule Take 2 mg by mouth 4 (four) times daily as needed for diarrhea or loose stools.    [name] losartan (COZAAR) 100 MG tablet Take 100 mg by mouth daily.    . metFORMIN (GLUCOPHAGE) 1000 MG tablet Take 1,000 mg by mouth 2 (two) times daily with a meal.    . Multiple Vitamins-Minerals (MULTIVITAMIN ADULT PO) Take 1 tablet by mouth daily.    [name] omeprazole (PRILOSEC) 20 MG capsule Take 20 mg by mouth daily.    . potassium chloride SA (K-DUR,KLOR-CON) 20 MEQ tablet Take 20 mEq by mouth 2 (two) times daily.    . pregabalin (LYRICA) 75 MG capsule Take 75 mg by mouth 2 (two) times daily.    [name] pyridOXINE (VITAMIN B-6) 100 MG tablet Take 100 mg by mouth daily.    . rivaroxaban (XARELTO) 20 MG TABS tablet Take 1 tablet (20 mg total) by mouth daily with supper. 30 tablet 6  . TRADJENTA 5 MG TABS tablet Take 5 mg by mouth every morning.  0  . VENTOLIN HFA 108 (90 BASE) MCG/ACT inhaler Take 1-2 puffs by mouth as needed.  3  . vitamin B-12 (CYANOCOBALAMIN) 1000 MCG tablet Take 1,000 mcg by mouth daily. 5000     No current facility-administered medications for this encounter.    Allergies  Allergen Reactions  . Aspirin Hives  . Ibuprofen Hives  . Penicillins Other (See Comments)    Social History   Social History  . Marital Status: Divorced    Spouse Name: N/A  . Number of Children: N/A  . Years of Education: N/A   Occupational History  .  Not on file.   Social History Main Topics  . Smoking status: Former Smoker -- 2.00 packs/day for 15 years    Types: Cigarettes    Quit date: 10/08/1979  . Smokeless tobacco: Never Used  . Alcohol Use: No  . Drug Use: No  . Sexual Activity: Not on file   Other  Topics Concern  . Not on file   Social History Narrative    Family History  Problem Relation Age of Onset  . Stroke Mother   . Hypertension Mother   . Alcoholism Mother   . Alcoholism Father   . Alcoholism Brother   . Hypertension Brother   . Hyperlipidemia Brother   . Anemia Brother   . Other Brother     bleeding hemorroids  . Heart disease Brother     valve surgery  . Heart attack Maternal Grandmother     heart valve surgery  . Hyperlipidemia Maternal Grandmother   . Hypertension Maternal Grandmother     ROS- All systems are reviewed and negative except as per the HPI above Records reviewed from PCP Bmet 01/06/15 creat 0.67, BUN 20, NA 137, Kt 3.6   Physical Exam: Filed Vitals:   03/11/15 0833  BP: 138/76  Pulse: 88  Height: 5' (1.524 m)  Weight: 236 lb (107.049 kg)    GEN- The patient is well appearing, alert and oriented x 3 today.   Head- normocephalic, atraumatic Eyes-  Sclera clear, conjunctiva pink Ears- hearing intact Oropharynx- clear Neck- supple, no JVP Lymph- no cervical lymphadenopathy Lungs- Clear to ausculation bilaterally, normal work of breathing Heart- Regular rate and rhythm, no murmurs, rubs or gallops, PMI not laterally displaced GI- soft, NT, ND, + BS Extremities- no clubbing, cyanosis, or edema MS- no significant deformity or atrophy Skin- no rash or lesion Psych- euthymic mood, full affect Neuro- strength and sensation are intact  EKG- not done oday.  Assessment and Plan:  1. H/O afib s/p abaltion 2012 Maintaining SR but has had a  few mins of rapid heartbeat associated with presyncope similar to afib episodes in the past. Chadsvasc score of at least 3 and is  concerned re stroke risk. She wanted to go on  blood thinner. Did well on coumadin without bleeding issues but got tired of lab draws and frequent dose adjustments. I am concerned taking with daily lodine but she is aware of bleeding risks and wants to proceed, bleeding precautions reviewed. After discussion of available DOAC's she would like to try Xarelto 20 mg daily. Will give Cardizem 30 mg to take one every 4 hours as a pill in pocket if develops sustained rapid heartbeat.  2. HTN Stable  F/u in 3 months.   Geroge Baseman Hammad Finkler, [facility] [address] [place], [place] [address] [phone]

## 2015-03-11 NOTE — Patient Instructions (Signed)
Your physician has recommended you make the following change in your medication:  1)Stop fish oil  Parking code [phone]

## 2015-03-24 ENCOUNTER — Telehealth (HOSPITAL_COMMUNITY): Payer: Self-pay | Admitting: *Deleted

## 2015-03-24 NOTE — Telephone Encounter (Addendum)
Patient called in stating she is having terrible GI side effects from the xarelto.  States she has daily diarrhea and stomach pain from the medication -- she is taking it with food as prescribed and was hoping the side effects would diminish once on the medication for few weeks but unfortunately it has only worsened.  Instructed patient I would discuss with Roderic Palau NP and return her call with instructions.   Butch Penny talked with patient was decided to try a week of sample of Eliquis and see if that helped with the diarrhea; if still no better then will reassess at that time on next steps. Patient agreeable to this plan.

## 2015-03-28 ENCOUNTER — Telehealth (HOSPITAL_COMMUNITY): Payer: Self-pay | Admitting: *Deleted

## 2015-03-28 NOTE — Telephone Encounter (Signed)
Pt called back in this morning saying she was going to stay on xarelto; her GI symptoms have greatly improved over weekend. Patient will call back if further issues.

## 2015-05-01 ENCOUNTER — Ambulatory Visit (HOSPITAL_BASED_OUTPATIENT_CLINIC_OR_DEPARTMENT_OTHER): Payer: 59 | Attending: Family Medicine

## 2015-05-01 VITALS — Ht 60.0 in | Wt 234.0 lb

## 2015-05-01 DIAGNOSIS — G4733 Obstructive sleep apnea (adult) (pediatric): Secondary | ICD-10-CM | POA: Diagnosis present

## 2015-05-01 DIAGNOSIS — R0683 Snoring: Secondary | ICD-10-CM | POA: Insufficient documentation

## 2015-05-01 DIAGNOSIS — R5383 Other fatigue: Secondary | ICD-10-CM | POA: Insufficient documentation

## 2015-05-21 DIAGNOSIS — G4733 Obstructive sleep apnea (adult) (pediatric): Secondary | ICD-10-CM | POA: Diagnosis not present

## 2015-05-21 NOTE — Progress Notes (Signed)
Patient Name: [facility], [facility] Date: 05/01/2015 Gender: Female D.O.B: January 03, 1951 Age (years): 63 Referring Provider: Derinda Late Height (inches): 5 Interpreting Physician: Baird Lyons MD, ABSM Weight (lbs): 234 RPSGT: Madelon Lips BMI: 4034 MRN: [id number] Neck Size: 18.00 CLINICAL INFORMATION Sleep Study Type: Split Night CPAP Indication for sleep study: Fatigue, OSA Epworth Sleepiness Score: 17  SLEEP STUDY TECHNIQUE As per the AASM Manual for the Scoring of Sleep and Associated Events v2.3 (April 2016) with a hypopnea requiring 4% desaturations. The channels recorded and monitored were frontal, central and occipital EEG, electrooculogram (EOG), submentalis EMG (chin), nasal and oral airflow, thoracic and abdominal wall motion, anterior tibialis EMG, snore microphone, electrocardiogram, and pulse oximetry. Continuous positive airway pressure (CPAP) was initiated when the patient met split night criteria and was titrated according to treat sleep-disordered breathing. MEDICATIONS Medications taken by the patient : charted for review Medications administered by patient during sleep study : No sleep medicine administered.  RESPIRATORY PARAMETERS Diagnostic Total AHI (/hr): 30.2 RDI (/hr): 34.1 OA Index (/hr): - CA Index (/hr): 0.0 REM AHI (/hr): N/A NREM AHI (/hr): 30.2 Supine AHI (/hr): N/A Non-supine AHI (/hr): 30.16 Min O2 Sat (%): 86.00 Mean O2 (%): 94.21 Time below 88% (min): 0.4     Titration Optimal Pressure (cm): 10.0 AHI at Optimal Pressure (/hr): 0.0 Min O2 at Optimal Pressure (%): 89.00 Supine % at Optimal (%): N/A Sleep % at Optimal (%): N/A      SLEEP ARCHITECTURE The recording time for the entire night was 360.8 minutes. During a baseline period of 187.3 minutes, the patient slept for 123.3 minutes in REM and nonREM, yielding a sleep efficiency of 65.8%. Sleep onset after lights out was 46.0 minutes with a REM latency of N/A minutes. The patient spent  25.95% of the night in stage N1 sleep, 74.05% in stage N2 sleep, 0.00% in stage N3 and 0.00% in REM. During the titration period of 165.8 minutes, the patient slept for 135.5 minutes in REM and nonREM, yielding a sleep efficiency of 81.7%. Sleep onset after CPAP initiation was 12.0 minutes with a REM latency of 85.5 minutes. The patient spent 14.76% of the night in stage N1 sleep, 78.97% in stage N2 sleep, 0.00% in stage N3 and 6.27% in REM.  CARDIAC DATA The 2 lead EKG demonstrated sinus rhythm. The mean heart rate was 85.60 beats per minute. Other EKG findings include: None.  LEG MOVEMENT DATA The total Periodic Limb Movements of Sleep (PLMS) were 0. The PLMS index was 0.00 .  IMPRESSIONS - Severe obstructive sleep apnea occurred during the diagnostic portion of the study (AHI = 30.2/hour). An optimal PAP pressure was determined to be 10.0. - No significant central sleep apnea occurred during the diagnostic portion of the study (CAI = 0.0/hour). - Mild oxygen desaturation was noted during the diagnostic portion of the study (Min O2 = 86.00%). - The patient snored with Loud snoring volume during the diagnostic portion of the study. - No cardiac abnormalities were noted during this study. - Clinically significant periodic limb movements did not occur during sleep.  DIAGNOSIS - Obstructive Sleep Apnea (327.23 [G47.33 ICD-10])  RECOMMENDATIONS - Recommend trial of CPAP 10 cwp. The patient wore a small ResMed Quattro FX full-face mask with heated humidifier. - Avoid alcohol, sedatives and other CNS depressants that may worsen sleep apnea and disrupt normal sleep architecture. - Sleep hygiene should be reviewed to assess factors that may improve sleep quality. - Weight management and regular exercise should be initiated  or continued.  Deneise Lever Diplomate, American Board of Sleep Medicine  ELECTRONICALLY SIGNED ON:  05/21/2015, 10:47 AM [place] PH: [phone]  [phone]   FX: [phone] [place]

## 2015-06-14 ENCOUNTER — Inpatient Hospital Stay (HOSPITAL_COMMUNITY): Admission: RE | Admit: 2015-06-14 | Payer: 59 | Source: Ambulatory Visit | Admitting: Nurse Practitioner

## 2015-06-16 ENCOUNTER — Inpatient Hospital Stay (HOSPITAL_COMMUNITY): Admission: RE | Admit: 2015-06-16 | Payer: 59 | Source: Ambulatory Visit | Admitting: Nurse Practitioner

## 2015-09-02 ENCOUNTER — Encounter: Payer: Self-pay | Admitting: Pulmonary Disease

## 2015-09-02 ENCOUNTER — Ambulatory Visit (INDEPENDENT_AMBULATORY_CARE_PROVIDER_SITE_OTHER): Payer: Commercial Managed Care - HMO | Admitting: Pulmonary Disease

## 2015-09-02 ENCOUNTER — Encounter (INDEPENDENT_AMBULATORY_CARE_PROVIDER_SITE_OTHER): Payer: Self-pay

## 2015-09-02 VITALS — BP 126/84 | HR 87 | Ht 60.0 in | Wt 242.0 lb

## 2015-09-02 DIAGNOSIS — G4733 Obstructive sleep apnea (adult) (pediatric): Secondary | ICD-10-CM

## 2015-09-02 DIAGNOSIS — Z9989 Dependence on other enabling machines and devices: Principal | ICD-10-CM

## 2015-09-02 MED ORDER — TRIAMCINOLONE ACETONIDE 55 MCG/ACT NA AERO: 2.0000 | INHALATION_SPRAY | Freq: Every day | NASAL | Status: DC

## 2015-09-02 NOTE — Progress Notes (Signed)
Past Medical History She  has a past medical history of Hypertension; Dysrhythmia; Depression; Shortness of breath; Asthma; Sleep apnea; Diabetes mellitus without complication ([place]); Chronic kidney disease; GERD (gastroesophageal reflux disease); Arthritis; Fibromyalgia; Cancer ([place]) (1995); Seasonal allergies; Contact lens/glasses fitting; Hyperlipemia; PONV (postoperative nausea and vomiting); and IBS (irritable bowel syndrome).  Past Surgical History She  has past surgical history that includes Breast lumpectomy with axillary lymph node dissection (1995); Diagnostic laparoscopy (1990); Tonsillectomy; Lumbar fusion; Cardiac electrophysiology mapping and ablation (2012); Colonoscopy; Carpal tunnel release (Right, 10/14/2012); and Cardiac catheterization (01/24/2009).  Current Outpatient Prescriptions on File Prior to Visit  Medication Sig  . ALPRAZolam (XANAX) 0.5 MG tablet   . Ascorbic Acid (VITAMIN C) 1000 MG tablet Take 1,000 mg by mouth daily. Takes 2  . atorvastatin (LIPITOR) 10 MG tablet Take 10 mg by mouth daily.  . beclomethasone (QVAR) 80 MCG/ACT inhaler Inhale 2 puffs into the lungs daily.  [name] BIOTIN 5000 PO Take 1 tablet by mouth daily.  [name] CALCIUM PO Take 1 tablet by mouth daily.  . carvedilol (COREG) 12.5 MG tablet Take 12.5 mg by mouth 2 (two) times daily.   . chlorpheniramine (CHLOR-TRIMETON) 4 MG tablet Take 4 mg by mouth 2 (two) times daily as needed for allergies.  . chlorthalidone (HYGROTON) 50 MG tablet Take 50 mg by mouth every morning.  . cholecalciferol (VITAMIN D) 1000 UNITS tablet Take 1,000 Units by mouth daily. Takes 5000  . co-enzyme Q-10 30 MG capsule Take 30 mg by mouth 3 (three) times daily.  . cyclobenzaprine (FLEXERIL) 10 MG tablet Take 10 mg by mouth at bedtime.  [name] diltiazem (CARDIZEM) 30 MG tablet Take 1 tablet every 4 hours AS NEEDED for HR>100 and BP>100  . escitalopram (LEXAPRO) 20 MG tablet Take 20 mg by mouth at bedtime.  [name] etodolac (LODINE) 400 MG tablet    . HYDROcodone-acetaminophen (NORCO) 5-325 MG per tablet Take 1 tablet by mouth every 6 (six) hours as needed for pain.  [name] loperamide (IMODIUM) 2 MG capsule Take 2 mg by mouth 4 (four) times daily as needed for diarrhea or loose stools.  [name] losartan (COZAAR) 100 MG tablet Take 100 mg by mouth daily.  . metFORMIN (GLUCOPHAGE) 1000 MG tablet Take 1,000 mg by mouth 2 (two) times daily with a meal.  . Multiple Vitamins-Minerals (MULTIVITAMIN ADULT PO) Take 1 tablet by mouth daily.  [name] omeprazole (PRILOSEC) 20 MG capsule Take 20 mg by mouth daily.  . potassium chloride SA (K-DUR,KLOR-CON) 20 MEQ tablet Take 20 mEq by mouth 2 (two) times daily.  . pregabalin (LYRICA) 75 MG capsule Take 75 mg by mouth 2 (two) times daily.  [name] pyridOXINE (VITAMIN B-6) 100 MG tablet Take 100 mg by mouth daily.  . rivaroxaban (XARELTO) 20 MG TABS tablet Take 1 tablet (20 mg total) by mouth daily with supper.  . TRADJENTA 5 MG TABS tablet Take 5 mg by mouth every morning.  . VENTOLIN HFA 108 (90 BASE) MCG/ACT inhaler Take 1-2 puffs by mouth as needed.  . vitamin B-12 (CYANOCOBALAMIN) 1000 MCG tablet Take 1,000 mcg by mouth daily. 5000   No current facility-administered medications on file prior to visit.    Allergies  Allergen Reactions  . Aspirin Hives  . Ibuprofen Hives  . Penicillins Other (See Comments)    Family History Her family history includes Alcoholism in her brother, father, and mother; Anemia in her brother; Heart attack in her maternal grandmother; Heart disease in her brother; Hyperlipidemia in her brother  and maternal grandmother; Hypertension in her brother, maternal grandmother, and mother; Other in her brother; Stroke in her mother.  Social History She  reports that she quit smoking about 35 years ago. Her smoking use included Cigarettes. She has a 30 pack-year smoking history. She has never used smokeless tobacco. She reports that she does not drink alcohol or use illicit drugs.  Review of  systems Review of Systems  Constitutional: Negative for fever and unexpected weight change.  HENT: Positive for congestion. Negative for dental problem, ear pain, nosebleeds, postnasal drip, rhinorrhea, sinus pressure, sneezing, sore throat and trouble swallowing.   Eyes: Negative for redness and itching.  Respiratory: Positive for cough and shortness of breath. Negative for chest tightness and wheezing.   Cardiovascular: Positive for palpitations. Negative for leg swelling.  Gastrointestinal: Positive for abdominal pain. Negative for nausea and vomiting.  Genitourinary: Negative for dysuria.  Musculoskeletal: Negative for joint swelling.  Skin: Negative for rash.  Neurological: Positive for headaches.  Hematological: Does not bruise/bleed easily.  Psychiatric/Behavioral: Positive for dysphoric mood. The patient is nervous/anxious.     Chief Complaint  Patient presents with  . Sleep Consult    referred by Dr. Sandi Mariscal for OSA. Epworth: 17.    Tests: PSG 05/01/15 >> AHI 30.2, SpO2 low 86%  Vital signs BP 126/84 mmHg  Pulse 87  Ht 5' (1.524 m)  Wt 242 lb (109.77 kg)  BMI 47.26 kg/m2  SpO2 94%  History of Present Illness Katie Owens is a 65 y.o. female for evaluation of sleep problems.  She had a sleep study done in October 2016.  This showed severe sleep apnea.  She had a prior history of sleep apnea.  She needs new CPAP set up.  She has been feeling more fatigued and tired.  She is sleeping more also.  She has noticed more trouble with her memory.  She snores, and will wake up feeling unrefreshed.  She previously had trouble using CPAP due to nasal congestion.  She denies morning headache.  She does not use anything to help her fall sleep or stay awake.  She denies sleep walking, sleep talking, bruxism, or nightmares.  There is no history of restless legs.  She denies sleep hallucinations, sleep paralysis, or cataplexy.  The Epworth score is 17 out of 24.   Physical  Exam:  General - No distress ENT - No sinus tenderness, no oral exudate, no LAN, no thyromegaly, TM clear, pupils equal/reactive, triangular uvula Cardiac - s1s2 regular, no murmur, pulses symmetric Chest - No wheeze/rales/dullness, good air entry, normal respiratory excursion Back - No focal tenderness Abd - Soft, non-tender, no organomegaly, + bowel sounds Ext - No edema Neuro - Normal strength, cranial nerves intact Skin - No rashes Psych - Normal mood, and behavior  Discussion: Her sleep study shows severe obstructive sleep apnea.  She has daytime fatigue and sleep disruption.  She has hx of HTN, depression, A fib, DM, and fibromyalgia.  We discussed how sleep apnea can affect various health problems, including risks for hypertension, cardiovascular disease, and diabetes.  We also discussed how sleep disruption can increase risks for accidents, such as while driving.  Weight loss as a means of improving sleep apnea was also reviewed.  Additional treatment options discussed were CPAP therapy, oral appliance, and surgical intervention.   Assessment/plan:  Obstructive sleep apnea. - will arrange for new CPAP set up  Nasal congestion. - she can try using OTC nasacort to assist with using CPAP mask  Patient Instructions  Will arrange for new CPAP set up  Nasacort two sprays each nostril daily  Follow up in 3 months      Chesley Mires, M.D. Pager [phone] 09/02/2015

## 2015-09-02 NOTE — Progress Notes (Signed)
   Subjective:    Patient ID: Katie Owens, female    DOB: 05/24/1951, 65 y.o.   MRN: [id number]  HPI    Review of Systems  Constitutional: Negative for fever and unexpected weight change.  HENT: Positive for congestion. Negative for dental problem, ear pain, nosebleeds, postnasal drip, rhinorrhea, sinus pressure, sneezing, sore throat and trouble swallowing.   Eyes: Negative for redness and itching.  Respiratory: Positive for cough and shortness of breath. Negative for chest tightness and wheezing.   Cardiovascular: Positive for palpitations. Negative for leg swelling.  Gastrointestinal: Positive for abdominal pain. Negative for nausea and vomiting.  Genitourinary: Negative for dysuria.  Musculoskeletal: Negative for joint swelling.  Skin: Negative for rash.  Neurological: Positive for headaches.  Hematological: Does not bruise/bleed easily.  Psychiatric/Behavioral: Positive for dysphoric mood. The patient is nervous/anxious.        Objective:   Physical Exam        Assessment & Plan:

## 2015-09-02 NOTE — Patient Instructions (Signed)
Will arrange for new CPAP set up  Nasacort two sprays each nostril daily  Follow up in 3 months

## 2015-10-11 ENCOUNTER — Encounter (INDEPENDENT_AMBULATORY_CARE_PROVIDER_SITE_OTHER): Payer: 59

## 2015-10-11 DIAGNOSIS — I480 Paroxysmal atrial fibrillation: Secondary | ICD-10-CM | POA: Diagnosis not present

## 2015-11-18 ENCOUNTER — Other Ambulatory Visit: Payer: Self-pay | Admitting: Family Medicine

## 2015-11-18 ENCOUNTER — Ambulatory Visit
Admission: RE | Admit: 2015-11-18 | Discharge: 2015-11-18 | Disposition: A | Discharge status: Home / Self Care | Payer: 59 | Source: Ambulatory Visit | Attending: Family Medicine | Admitting: Family Medicine

## 2015-11-18 DIAGNOSIS — M25562 Pain in left knee: Principal | ICD-10-CM

## 2015-11-18 DIAGNOSIS — M25561 Pain in right knee: Secondary | ICD-10-CM

## 2015-12-13 ENCOUNTER — Ambulatory Visit: Payer: 59 | Admitting: Pulmonary Disease

## 2016-01-05 ENCOUNTER — Telehealth: Payer: Self-pay | Admitting: Internal Medicine

## 2016-01-05 NOTE — Telephone Encounter (Signed)
FYI --- Dr. Deboraha Sprang office called afib clinic but stated specifically that she wanted to see Dr. [name], explained Roderic Palau NP is the provider that sees patient's in afib clinic not Dr. [name]. Offered appt in afib clinic but they preferred pt be seen by Dr. [name].

## 2016-01-05 NOTE — Telephone Encounter (Addendum)
Dr. Deboraha Sprang office called to make appt with [facility] -pt wants to switch from [facility] to [facility] for Afib-pt has also seen Holly Bodily they make an appt with her and she can refer back to [facility] if needed-

## 2016-01-06 NOTE — Telephone Encounter (Signed)
ok 

## 2016-01-07 NOTE — Telephone Encounter (Signed)
i am happy to see in a routine patient spot.  Urgent issues per AF clinic.

## 2016-01-25 ENCOUNTER — Ambulatory Visit: Payer: Self-pay | Admitting: Internal Medicine

## 2016-03-07 ENCOUNTER — Ambulatory Visit (INDEPENDENT_AMBULATORY_CARE_PROVIDER_SITE_OTHER): Payer: BC Managed Care – PPO | Admitting: Internal Medicine

## 2016-03-07 ENCOUNTER — Encounter: Payer: Self-pay | Admitting: Internal Medicine

## 2016-03-07 ENCOUNTER — Encounter (INDEPENDENT_AMBULATORY_CARE_PROVIDER_SITE_OTHER): Payer: Self-pay

## 2016-03-07 VITALS — BP 135/69 | HR 85 | Ht 59.0 in | Wt 224.8 lb

## 2016-03-07 DIAGNOSIS — I4891 Unspecified atrial fibrillation: Secondary | ICD-10-CM

## 2016-03-07 DIAGNOSIS — G4733 Obstructive sleep apnea (adult) (pediatric): Secondary | ICD-10-CM | POA: Diagnosis not present

## 2016-03-07 NOTE — Patient Instructions (Addendum)
Medication Instructions:  Your physician recommends that you continue on your current medications as directed. Please refer to the Current Medication list given to you today.   Labwork: None ordered   Testing/Procedures:  LINQ implant on 03/08/16  Please check in at [facility] at 6:00am  Follow-Up:  Your physician recommends that you schedule a follow-up appointment in: 10 days in the device clinic for wound check and then 3 months with Dr [name]   Any Other Special Instructions Will Be Listed Below (If Applicable).     If you need a refill on your cardiac medications before your next appointment, please call your pharmacy.

## 2016-03-07 NOTE — Progress Notes (Signed)
Electrophysiology Office Note   Date:  03/07/2016   ID:  Katie Owens, DOB 01-Nov-1950, MRN [id number]  PCP:  Marylene Land, MD  Cardiologist:  Dr Claiborne Billings Primary Electrophysiologist: Thompson Grayer, MD    Chief Complaint  Patient presents with  . Atrial Fibrillation     History of Present Illness: Katie Owens is a 65 y.o. female who presents today for electrophysiology evaluation.   She has recently been seen in AF clinic with very short palpitations.  She is s/p afib ablation at [facility] by Dr Clyda Hurdle in 2012.  She has not had symptoms of sustained AF since that time. She has worn a subsequent monitor by Dr Sandi Mariscal which documented only a short run of SVT.  She is now back on anticoagulation.  I am asked by Dr Sandi Mariscal to weigh in on the patients afib management.  Today, she denies symptoms of chest pain, shortness of breath, orthopnea, PND, lower extremity edema, claudication, dizziness, presyncope, syncope, bleeding, or neurologic sequela. The patient is tolerating medications without difficulties and is otherwise without complaint today.    Past Medical History:  Diagnosis Date  . Arthritis   . Asthma   . Cancer [facility]) 1995   breast cancer lt with 12 nodes out  . Chronic kidney disease    hx stones  . Contact lens/glasses fitting    wears contacts or glasses  . Depression   . Diabetes mellitus without complication ([place])   . Dysrhythmia    hx AF  . Fibromyalgia   . GERD (gastroesophageal reflux disease)   . Hyperlipemia   . Hypertension   . IBS (irritable bowel syndrome)   . PONV (postoperative nausea and vomiting)   . Seasonal allergies   . Shortness of breath   . Sleep apnea    uses a cpap   Past Surgical History:  Procedure Laterality Date  . BREAST LUMPECTOMY WITH AXILLARY LYMPH NODE DISSECTION  1995   12 nodes removed  . CARDIAC CATHETERIZATION  01/24/2009   left heart  . CARDIAC ELECTROPHYSIOLOGY [place] AND ABLATION  2012  . CARPAL TUNNEL RELEASE Right  10/14/2012   Procedure: CARPAL TUNNEL RELEASE;  Surgeon: Wynonia Sours, MD;  Location: [facility];  Service: Orthopedics;  Laterality: Right;  . COLONOSCOPY    . DIAGNOSTIC LAPAROSCOPY  1990  . LUMBAR FUSION    . TONSILLECTOMY       Current Outpatient Prescriptions  Medication Sig Dispense Refill  . ALPRAZolam (XANAX) 0.5 MG tablet Take 0.5 mg by mouth at bedtime as needed for sleep.   1  . Ascorbic Acid (VITAMIN C) 1000 MG tablet Take 2,000 mg by mouth daily.     [name] atorvastatin (LIPITOR) 10 MG tablet Take 10 mg by mouth daily.    . beclomethasone (QVAR) 80 MCG/ACT inhaler Inhale 2 puffs into the lungs daily.    [name] BIOTIN 5000 PO Take 1 tablet by mouth daily.    [name] CALCIUM PO Take 1 tablet by mouth daily.    . carvedilol (COREG) 12.5 MG tablet Take 12.5 mg by mouth 2 (two) times daily.     . chlorthalidone (HYGROTON) 50 MG tablet Take 50 mg by mouth every morning.  1  . cholecalciferol (VITAMIN D) 1000 UNITS tablet Take 5,000 Units by mouth daily.     [name] co-enzyme Q-10 30 MG capsule Take 30 mg by mouth 3 (three) times daily.    . cyclobenzaprine (FLEXERIL) 10 MG tablet Take 10 mg by  mouth at bedtime.    [name] diltiazem (CARDIZEM) 30 MG tablet Take 1 tablet by mouth every 4 hours AS NEEDED for HR>100 and BP>100    . diphenhydrAMINE (BENADRYL) 25 MG tablet Take 25 mg by mouth every 6 (six) hours as needed for allergies.    . DULoxetine (CYMBALTA) 60 MG capsule Take 1 capsule by mouth daily.    [name] escitalopram (LEXAPRO) 20 MG tablet Take 20 mg by mouth at bedtime.    [name] etodolac (LODINE) 400 MG tablet   7  . HYDROcodone-acetaminophen (NORCO) 5-325 MG per tablet Take 1 tablet by mouth every 6 (six) hours as needed for pain. 30 tablet 0  . loperamide (IMODIUM) 2 MG capsule Take 2 mg by mouth 4 (four) times daily as needed for diarrhea or loose stools.    [name] losartan (COZAAR) 100 MG tablet Take 100 mg by mouth daily.    . metFORMIN (GLUCOPHAGE-XR) 500 MG 24 hr tablet Take 1 tablet by mouth  2 (two) times daily.    . Multiple Vitamins-Minerals (MULTIVITAMIN ADULT PO) Take 1 tablet by mouth daily.    [name] omeprazole (PRILOSEC) 20 MG capsule Take 20 mg by mouth daily.    . potassium chloride SA (K-DUR,KLOR-CON) 20 MEQ tablet Take 20 mEq by mouth 2 (two) times daily.    . pregabalin (LYRICA) 75 MG capsule Take 75 mg by mouth 2 (two) times daily.    [name] pyridOXINE (VITAMIN B-6) 100 MG tablet Take 100 mg by mouth daily.    . rivaroxaban (XARELTO) 20 MG TABS tablet Take 1 tablet (20 mg total) by mouth daily with supper. 30 tablet 6  . TRADJENTA 5 MG TABS tablet Take 5 mg by mouth every morning.  0  . triamcinolone (NASACORT AQ) 55 MCG/ACT AERO nasal inhaler Place 2 sprays into the nose daily. 1 Inhaler 12  . VENTOLIN HFA 108 (90 BASE) MCG/ACT inhaler Inhale 1-2 puffs into the lungs as directed. As needed for shortness of breath or wheezing  3  . vitamin B-12 (CYANOCOBALAMIN) 1000 MCG tablet Take 5,000 mcg by mouth daily.      No current facility-administered medications for this visit.     Allergies:   Penicillins; Aspirin; and Ibuprofen   Social History:  The patient  reports that she quit smoking about 36 years ago. Her smoking use included Cigarettes. She has a 30.00 pack-year smoking history. She has never used smokeless tobacco. She reports that she does not drink alcohol or use drugs.   Family History:  The patient's  family history includes Alcoholism in her brother, father, and mother; Anemia in her brother; Heart attack in her maternal grandmother; Heart disease in her brother; Hyperlipidemia in her brother and maternal grandmother; Hypertension in her brother, maternal grandmother, and mother; Other in her brother; Stroke in her mother.    ROS:  Please see the history of present illness.   All other systems are reviewed and negative.    PHYSICAL EXAM: VS:  BP 135/69   Pulse 85   Ht 4\' 11"  (1.499 m)   Wt 224 lb 12.8 oz (102 kg)   BMI 45.40 kg/m  , BMI Body mass index is 45.4  kg/m. GEN: Well nourished, well developed, in no acute distress  HEENT: normal  Neck: no JVD, carotid bruits, or masses Cardiac: RRR; no murmurs, rubs, or gallops,no edema  Respiratory:  clear to auscultation bilaterally, normal work of breathing GI: soft, nontender, nondistended, + BS MS: no deformity or atrophy  Skin: warm and dry  Neuro:  Strength and sensation are intact Psych: euthymic mood, full affect  EKG:  EKG is ordered today. The ekg ordered today shows sinus rhythm 85 bpm, poor R wave progression   Recent Labs: No results found for requested labs within last 8760 hours.    Lipid Panel     Component Value Date/Time   CHOL  01/24/2009 0420    140        ATP III CLASSIFICATION:  <200     mg/dL   Desirable  200-239  mg/dL   Borderline High  >=240    mg/dL   High          TRIG 106 01/24/2009 0420   HDL 61 01/24/2009 0420   CHOLHDL 2.3 01/24/2009 0420   VLDL 21 01/24/2009 0420   LDLCALC  01/24/2009 0420    58        Total Cholesterol/HDL:CHD Risk Coronary Heart Disease Risk Table                     Men   Women  1/2 Average Risk   3.4   3.3  Average Risk       5.0   4.4  2 X Average Risk   9.6   7.1  3 X Average Risk  23.4   11.0        Use the calculated Patient Ratio above and the CHD Risk Table to determine the patient's CHD Risk.        ATP III CLASSIFICATION (LDL):  <100     mg/dL   Optimal  100-129  mg/dL   Near or Above                    Optimal  130-159  mg/dL   Borderline  160-189  mg/dL   High  >190     mg/dL   Very High     Wt Readings from Last 3 Encounters:  03/07/16 224 lb 12.8 oz (102 kg)  09/02/15 242 lb (109.8 kg)  05/01/15 234 lb (106.1 kg)      Other studies Reviewed: Additional studies/ records that were reviewed today include: AF clinic notes, prior echo, Dr Estill Bamberg records  Review of the above records today demonstrates: as above   ASSESSMENT AND PLAN:  1.  Palpitations Unclear if this is afib or not I think  that to better manage her afib and to characterize her palpitations post ablation that an implantable loop recorder would be beneficial.  She has previously worn telemetry monitoring. Risks and benefits of ILR placement were discussed at length with the patient who wishes to proceed.  We will schedule for the next available time.  2. Paroxysmal afib Well controlled s/p ablation at [facility] chads2vasc score is at least 4.  Guidelines would support long term anticoagulation.  She and Dr Sandi Mariscal are both [place] of this.  I have therefore advised ILR placement as above to better evaluate her AF post ablation.  If she is demonstrated to have no afib, then I would favor stopping anticoagulation and resuming when AF is detected by remote monitoring the patient closely.  Lifestyle modification is advised.  Without appropriate treatment of her diabetes, sleep apnea, and morbid obesity, she will almost certainly have further af in the future.  3. Obesity Body mass index is 45.4 kg/m. Weight loss and regular exercise advised  4. OSA Compliance with therapy is essential to prevent af recurrence  Follow-up:  Return to see me 3 months after ILR placement.  We will review 3 months of data and then decide about anticoagulation going forward  Current medicines are reviewed at length with the patient today.   The patient does not have concerns regarding her medicines.  The following changes were made today:  none  Signed, Thompson Grayer, MD  03/07/2016 5:55 PM     [address] [place] [id number] [phone] (office) [phone] (fax)

## 2016-03-08 ENCOUNTER — Ambulatory Visit (HOSPITAL_COMMUNITY)
Admission: RE | Admit: 2016-03-08 | Discharge: 2016-03-08 | Disposition: A | Discharge status: Home / Self Care | Payer: Medicare Other | Source: Ambulatory Visit | Attending: Internal Medicine | Admitting: Internal Medicine

## 2016-03-08 ENCOUNTER — Encounter (HOSPITAL_COMMUNITY): Payer: Self-pay | Admitting: Internal Medicine

## 2016-03-08 ENCOUNTER — Encounter (HOSPITAL_COMMUNITY)
Admission: RE | Disposition: A | Discharge status: Home / Self Care | Payer: Self-pay | Source: Ambulatory Visit | Attending: Internal Medicine

## 2016-03-08 DIAGNOSIS — M1990 Unspecified osteoarthritis, unspecified site: Secondary | ICD-10-CM | POA: Diagnosis not present

## 2016-03-08 DIAGNOSIS — M797 Fibromyalgia: Secondary | ICD-10-CM | POA: Insufficient documentation

## 2016-03-08 DIAGNOSIS — F329 Major depressive disorder, single episode, unspecified: Secondary | ICD-10-CM | POA: Insufficient documentation

## 2016-03-08 DIAGNOSIS — Z87891 Personal history of nicotine dependence: Secondary | ICD-10-CM | POA: Insufficient documentation

## 2016-03-08 DIAGNOSIS — K219 Gastro-esophageal reflux disease without esophagitis: Secondary | ICD-10-CM | POA: Diagnosis not present

## 2016-03-08 DIAGNOSIS — Z853 Personal history of malignant neoplasm of breast: Secondary | ICD-10-CM | POA: Diagnosis not present

## 2016-03-08 DIAGNOSIS — Z7901 Long term (current) use of anticoagulants: Secondary | ICD-10-CM | POA: Insufficient documentation

## 2016-03-08 DIAGNOSIS — R002 Palpitations: Secondary | ICD-10-CM | POA: Diagnosis not present

## 2016-03-08 DIAGNOSIS — E669 Obesity, unspecified: Secondary | ICD-10-CM | POA: Insufficient documentation

## 2016-03-08 DIAGNOSIS — G4733 Obstructive sleep apnea (adult) (pediatric): Secondary | ICD-10-CM | POA: Diagnosis not present

## 2016-03-08 DIAGNOSIS — Z6841 Body Mass Index (BMI) 40.0 and over, adult: Secondary | ICD-10-CM | POA: Diagnosis not present

## 2016-03-08 DIAGNOSIS — I480 Paroxysmal atrial fibrillation: Secondary | ICD-10-CM | POA: Diagnosis present

## 2016-03-08 DIAGNOSIS — E785 Hyperlipidemia, unspecified: Secondary | ICD-10-CM | POA: Insufficient documentation

## 2016-03-08 DIAGNOSIS — I4891 Unspecified atrial fibrillation: Secondary | ICD-10-CM | POA: Diagnosis not present

## 2016-03-08 DIAGNOSIS — Z8249 Family history of ischemic heart disease and other diseases of the circulatory system: Secondary | ICD-10-CM | POA: Insufficient documentation

## 2016-03-08 DIAGNOSIS — Z7984 Long term (current) use of oral hypoglycemic drugs: Secondary | ICD-10-CM | POA: Diagnosis not present

## 2016-03-08 DIAGNOSIS — Z880 Allergy status to penicillin: Secondary | ICD-10-CM | POA: Diagnosis not present

## 2016-03-08 DIAGNOSIS — Z7951 Long term (current) use of inhaled steroids: Secondary | ICD-10-CM | POA: Diagnosis not present

## 2016-03-08 DIAGNOSIS — K589 Irritable bowel syndrome without diarrhea: Secondary | ICD-10-CM | POA: Diagnosis not present

## 2016-03-08 DIAGNOSIS — J45909 Unspecified asthma, uncomplicated: Secondary | ICD-10-CM | POA: Insufficient documentation

## 2016-03-08 DIAGNOSIS — N189 Chronic kidney disease, unspecified: Secondary | ICD-10-CM | POA: Diagnosis not present

## 2016-03-08 DIAGNOSIS — I129 Hypertensive chronic kidney disease with stage 1 through stage 4 chronic kidney disease, or unspecified chronic kidney disease: Secondary | ICD-10-CM | POA: Insufficient documentation

## 2016-03-08 DIAGNOSIS — E1122 Type 2 diabetes mellitus with diabetic chronic kidney disease: Secondary | ICD-10-CM | POA: Diagnosis not present

## 2016-03-08 DIAGNOSIS — Z823 Family history of stroke: Secondary | ICD-10-CM | POA: Diagnosis not present

## 2016-03-08 HISTORY — PX: EP IMPLANTABLE DEVICE: SHX172B

## 2016-03-08 LAB — GLUCOSE, CAPILLARY: GLUCOSE-CAPILLARY: 113 mg/dL — AB (ref 65–99)

## 2016-03-08 SURGERY — LOOP RECORDER INSERTION
Anesthesia: LOCAL

## 2016-03-08 MED ORDER — LIDOCAINE-EPINEPHRINE 1 %-1:100000 IJ SOLN
INTRAMUSCULAR | Status: AC
  Filled 2016-03-08: qty 1

## 2016-03-08 MED ORDER — LIDOCAINE-EPINEPHRINE 1 %-1:100000 IJ SOLN
INTRAMUSCULAR | Status: DC | PRN
  Administered 2016-03-08: 10 mL

## 2016-03-08 SURGICAL SUPPLY — 2 items
LOOP REVEAL LINQSYS (Prosthesis & Implant Heart) ×2 IMPLANT
PACK LOOP INSERTION (CUSTOM PROCEDURE TRAY) ×2 IMPLANT

## 2016-03-08 NOTE — Interval H&P Note (Signed)
History and Physical Interval Note:  03/08/2016 7:30 AM  Katie Owens  has presented today for surgery, with the diagnosis of afib  The various methods of treatment have been discussed with the patient and family. After consideration of risks, benefits and other options for treatment, the patient has consented to  Procedure(s): Loop Recorder Insertion (N/A) as a surgical intervention .  The patient's history has been reviewed, patient examined, no change in status, stable for surgery.  I have reviewed the patient's chart and labs.  Questions were answered to the patient's satisfaction.     Thompson Grayer

## 2016-03-08 NOTE — Discharge Instructions (Signed)
OK to remove outer dressing and shower in 24 hours Do not remove steri-strips, though they may fall off on their own.

## 2016-03-08 NOTE — H&P (View-Only) (Signed)
Electrophysiology Office Note   Date:  03/07/2016   ID:  Katie Owens, DOB Nov 28, 1950, MRN [id number]  PCP:  Marylene Land, MD  Cardiologist:  Dr Claiborne Billings Primary Electrophysiologist: Thompson Grayer, MD    Chief Complaint  Patient presents with  . Atrial Fibrillation     History of Present Illness: Katie Owens is a 65 y.o. female who presents today for electrophysiology evaluation.   She has recently been seen in AF clinic with very short palpitations.  She is s/p afib ablation at [facility] by Dr Clyda Hurdle in 2012.  She has not had symptoms of sustained AF since that time. She has worn a subsequent monitor by Dr Sandi Mariscal which documented only a short run of SVT.  She is now back on anticoagulation.  I am asked by Dr Sandi Mariscal to weigh in on the patients afib management.  Today, she denies symptoms of chest pain, shortness of breath, orthopnea, PND, lower extremity edema, claudication, dizziness, presyncope, syncope, bleeding, or neurologic sequela. The patient is tolerating medications without difficulties and is otherwise without complaint today.    Past Medical History:  Diagnosis Date  . Arthritis   . Asthma   . Cancer [facility]) 1995   breast cancer lt with 12 nodes out  . Chronic kidney disease    hx stones  . Contact lens/glasses fitting    wears contacts or glasses  . Depression   . Diabetes mellitus without complication ([place])   . Dysrhythmia    hx AF  . Fibromyalgia   . GERD (gastroesophageal reflux disease)   . Hyperlipemia   . Hypertension   . IBS (irritable bowel syndrome)   . PONV (postoperative nausea and vomiting)   . Seasonal allergies   . Shortness of breath   . Sleep apnea    uses a cpap   Past Surgical History:  Procedure Laterality Date  . BREAST LUMPECTOMY WITH AXILLARY LYMPH NODE DISSECTION  1995   12 nodes removed  . CARDIAC CATHETERIZATION  01/24/2009   left heart  . CARDIAC ELECTROPHYSIOLOGY [place] AND ABLATION  2012  . CARPAL TUNNEL RELEASE Right  10/14/2012   Procedure: CARPAL TUNNEL RELEASE;  Surgeon: Wynonia Sours, MD;  Location: [place];  Service: Orthopedics;  Laterality: Right;  . COLONOSCOPY    . DIAGNOSTIC LAPAROSCOPY  1990  . LUMBAR FUSION    . TONSILLECTOMY       Current Outpatient Prescriptions  Medication Sig Dispense Refill  . ALPRAZolam (XANAX) 0.5 MG tablet Take 0.5 mg by mouth at bedtime as needed for sleep.   1  . Ascorbic Acid (VITAMIN C) 1000 MG tablet Take 2,000 mg by mouth daily.     [name] atorvastatin (LIPITOR) 10 MG tablet Take 10 mg by mouth daily.    . beclomethasone (QVAR) 80 MCG/ACT inhaler Inhale 2 puffs into the lungs daily.    [name] BIOTIN 5000 PO Take 1 tablet by mouth daily.    [name] CALCIUM PO Take 1 tablet by mouth daily.    . carvedilol (COREG) 12.5 MG tablet Take 12.5 mg by mouth 2 (two) times daily.     . chlorthalidone (HYGROTON) 50 MG tablet Take 50 mg by mouth every morning.  1  . cholecalciferol (VITAMIN D) 1000 UNITS tablet Take 5,000 Units by mouth daily.     [name] co-enzyme Q-10 30 MG capsule Take 30 mg by mouth 3 (three) times daily.    . cyclobenzaprine (FLEXERIL) 10 MG tablet Take 10 mg by  mouth at bedtime.    [name] diltiazem (CARDIZEM) 30 MG tablet Take 1 tablet by mouth every 4 hours AS NEEDED for HR>100 and BP>100    . diphenhydrAMINE (BENADRYL) 25 MG tablet Take 25 mg by mouth every 6 (six) hours as needed for allergies.    . DULoxetine (CYMBALTA) 60 MG capsule Take 1 capsule by mouth daily.    [name] escitalopram (LEXAPRO) 20 MG tablet Take 20 mg by mouth at bedtime.    [name] etodolac (LODINE) 400 MG tablet   7  . HYDROcodone-acetaminophen (NORCO) 5-325 MG per tablet Take 1 tablet by mouth every 6 (six) hours as needed for pain. 30 tablet 0  . loperamide (IMODIUM) 2 MG capsule Take 2 mg by mouth 4 (four) times daily as needed for diarrhea or loose stools.    [name] losartan (COZAAR) 100 MG tablet Take 100 mg by mouth daily.    . metFORMIN (GLUCOPHAGE-XR) 500 MG 24 hr tablet Take 1 tablet by mouth  2 (two) times daily.    . Multiple Vitamins-Minerals (MULTIVITAMIN ADULT PO) Take 1 tablet by mouth daily.    [name] omeprazole (PRILOSEC) 20 MG capsule Take 20 mg by mouth daily.    . potassium chloride SA (K-DUR,KLOR-CON) 20 MEQ tablet Take 20 mEq by mouth 2 (two) times daily.    . pregabalin (LYRICA) 75 MG capsule Take 75 mg by mouth 2 (two) times daily.    [name] pyridOXINE (VITAMIN B-6) 100 MG tablet Take 100 mg by mouth daily.    . rivaroxaban (XARELTO) 20 MG TABS tablet Take 1 tablet (20 mg total) by mouth daily with supper. 30 tablet 6  . TRADJENTA 5 MG TABS tablet Take 5 mg by mouth every morning.  0  . triamcinolone (NASACORT AQ) 55 MCG/ACT AERO nasal inhaler Place 2 sprays into the nose daily. 1 Inhaler 12  . VENTOLIN HFA 108 (90 BASE) MCG/ACT inhaler Inhale 1-2 puffs into the lungs as directed. As needed for shortness of breath or wheezing  3  . vitamin B-12 (CYANOCOBALAMIN) 1000 MCG tablet Take 5,000 mcg by mouth daily.      No current facility-administered medications for this visit.     Allergies:   Penicillins; Aspirin; and Ibuprofen   Social History:  The patient  reports that she quit smoking about 36 years ago. Her smoking use included Cigarettes. She has a 30.00 pack-year smoking history. She has never used smokeless tobacco. She reports that she does not drink alcohol or use drugs.   Family History:  The patient's  family history includes Alcoholism in her brother, father, and mother; Anemia in her brother; Heart attack in her maternal grandmother; Heart disease in her brother; Hyperlipidemia in her brother and maternal grandmother; Hypertension in her brother, maternal grandmother, and mother; Other in her brother; Stroke in her mother.    ROS:  Please see the history of present illness.   All other systems are reviewed and negative.    PHYSICAL EXAM: VS:  BP 135/69   Pulse 85   Ht 4\' 11"  (1.499 m)   Wt 224 lb 12.8 oz (102 kg)   BMI 45.40 kg/m  , BMI Body mass index is 45.4  kg/m. GEN: Well nourished, well developed, in no acute distress  HEENT: normal  Neck: no JVD, carotid bruits, or masses Cardiac: RRR; no murmurs, rubs, or gallops,no edema  Respiratory:  clear to auscultation bilaterally, normal work of breathing GI: soft, nontender, nondistended, + BS MS: no deformity or atrophy  Skin: warm and dry  Neuro:  Strength and sensation are intact Psych: euthymic mood, full affect  EKG:  EKG is ordered today. The ekg ordered today shows sinus rhythm 85 bpm, poor R wave progression   Recent Labs: No results found for requested labs within last 8760 hours.    Lipid Panel     Component Value Date/Time   CHOL  01/24/2009 0420    140        ATP III CLASSIFICATION:  <200     mg/dL   Desirable  200-239  mg/dL   Borderline High  >=240    mg/dL   High          TRIG 106 01/24/2009 0420   HDL 61 01/24/2009 0420   CHOLHDL 2.3 01/24/2009 0420   VLDL 21 01/24/2009 0420   LDLCALC  01/24/2009 0420    58        Total Cholesterol/HDL:CHD Risk Coronary Heart Disease Risk Table                     Men   Women  1/2 Average Risk   3.4   3.3  Average Risk       5.0   4.4  2 X Average Risk   9.6   7.1  3 X Average Risk  23.4   11.0        Use the calculated Patient Ratio above and the CHD Risk Table to determine the patient's CHD Risk.        ATP III CLASSIFICATION (LDL):  <100     mg/dL   Optimal  100-129  mg/dL   Near or Above                    Optimal  130-159  mg/dL   Borderline  160-189  mg/dL   High  >190     mg/dL   Very High     Wt Readings from Last 3 Encounters:  03/07/16 224 lb 12.8 oz (102 kg)  09/02/15 242 lb (109.8 kg)  05/01/15 234 lb (106.1 kg)      Other studies Reviewed: Additional studies/ records that were reviewed today include: AF clinic notes, prior echo, Dr Estill Bamberg records  Review of the above records today demonstrates: as above   ASSESSMENT AND PLAN:  1.  Palpitations Unclear if this is afib or not I think  that to better manage her afib and to characterize her palpitations post ablation that an implantable loop recorder would be beneficial.  She has previously worn telemetry monitoring. Risks and benefits of ILR placement were discussed at length with the patient who wishes to proceed.  We will schedule for the next available time.  2. Paroxysmal afib Well controlled s/p ablation at [facility] chads2vasc score is at least 4.  Guidelines would support long term anticoagulation.  She and Dr Sandi Mariscal are both Mik[place] of this.  I have therefore advised ILR placement as above to better evaluate her AF post ablation.  If she is demonstrated to have no afib, then I would favor stopping anticoagulation and resuming when AF is detected by remote monitoring the patient closely.  Lifestyle modification is advised.  Without appropriate treatment of her diabetes, sleep apnea, and morbid obesity, she will almost certainly have further af in the future.  3. Obesity Body mass index is 45.4 kg/m. Weight loss and regular exercise advised  4. OSA Compliance with therapy is essential to prevent af recurrence  Follow-up:  Return to see me 3 months after ILR placement.  We will review 3 months of data and then decide about anticoagulation going forward  Current medicines are reviewed at length with the patient today.   The patient does not have concerns regarding her medicines.  The following changes were made today:  none  Signed, Thompson Grayer, MD  03/07/2016 5:55 PM     [place] [id number] [phone] (office) [phone] (fax)

## 2016-03-08 NOTE — Progress Notes (Signed)
Pt. D/C home per self  s/p Loop Insertion; site unremarkable, no c/o

## 2016-03-19 ENCOUNTER — Ambulatory Visit: Payer: Medicare Other

## 2016-04-09 ENCOUNTER — Ambulatory Visit (INDEPENDENT_AMBULATORY_CARE_PROVIDER_SITE_OTHER): Payer: Medicare Other | Admitting: *Deleted

## 2016-04-09 DIAGNOSIS — I4891 Unspecified atrial fibrillation: Secondary | ICD-10-CM

## 2016-04-09 NOTE — Progress Notes (Signed)
Carelink Summary Report / Loop Recorder 

## 2016-05-02 ENCOUNTER — Other Ambulatory Visit: Payer: Self-pay | Admitting: *Deleted

## 2016-05-02 MED ORDER — RIVAROXABAN 20 MG PO TABS: 20.0000 mg | ORAL_TABLET | Freq: Every day | ORAL | 1 refills | Status: DC

## 2016-05-02 NOTE — Telephone Encounter (Signed)
Patient requested just enough refills to last until she comes in for her office visit in December. She is not sure if she will continue this medication or not until she discusses with Dr [name].

## 2016-05-07 ENCOUNTER — Ambulatory Visit (INDEPENDENT_AMBULATORY_CARE_PROVIDER_SITE_OTHER): Payer: Medicare Other | Admitting: *Deleted

## 2016-05-07 DIAGNOSIS — I4891 Unspecified atrial fibrillation: Secondary | ICD-10-CM

## 2016-05-07 NOTE — Progress Notes (Signed)
Carelink Summary Report / Loop Recorder 

## 2016-05-13 LAB — CUP PACEART REMOTE DEVICE CHECK
MDC IDC PG IMPLANT DT: 20170907
MDC IDC SESS DTM: 20171007113547

## 2016-05-13 NOTE — Progress Notes (Signed)
Carelink summary report received. Battery status OK. Normal device function. No new symptom episodes, tachy episodes, brady, or pause episodes. No new AF episodes. Monthly summary reports and ROV/PRN 

## 2016-06-06 ENCOUNTER — Ambulatory Visit (INDEPENDENT_AMBULATORY_CARE_PROVIDER_SITE_OTHER): Payer: Medicare Other | Admitting: *Deleted

## 2016-06-06 DIAGNOSIS — I4891 Unspecified atrial fibrillation: Secondary | ICD-10-CM | POA: Diagnosis not present

## 2016-06-06 NOTE — Progress Notes (Signed)
Carelink Summary Report / Loop Recorder 

## 2016-06-11 ENCOUNTER — Encounter: Payer: Medicare Other | Admitting: Internal Medicine

## 2016-06-13 ENCOUNTER — Other Ambulatory Visit: Payer: Self-pay | Admitting: Nurse Practitioner

## 2016-06-21 LAB — CUP PACEART REMOTE DEVICE CHECK
MDC IDC PG IMPLANT DT: 20170907
MDC IDC SESS DTM: 20171106123603

## 2016-06-21 NOTE — Progress Notes (Signed)
Carelink summary report received. Battery status OK. Normal device function. No new symptom episodes, tachy episodes, brady, or pause episodes. No new AF episodes. Monthly summary reports and ROV/PRN 

## 2016-06-22 ENCOUNTER — Encounter: Payer: Self-pay | Admitting: Internal Medicine

## 2016-07-06 ENCOUNTER — Ambulatory Visit (INDEPENDENT_AMBULATORY_CARE_PROVIDER_SITE_OTHER): Payer: Medicare Other | Admitting: *Deleted

## 2016-07-06 ENCOUNTER — Encounter: Payer: Self-pay | Admitting: Internal Medicine

## 2016-07-06 DIAGNOSIS — I4891 Unspecified atrial fibrillation: Secondary | ICD-10-CM

## 2016-07-06 NOTE — Progress Notes (Signed)
Carelink Summary Report / Loop Recorder 

## 2016-07-13 ENCOUNTER — Encounter: Payer: Medicare Other | Admitting: Internal Medicine

## 2016-07-20 ENCOUNTER — Other Ambulatory Visit: Payer: Self-pay | Admitting: Internal Medicine

## 2016-07-26 LAB — CUP PACEART REMOTE DEVICE CHECK
Implantable Pulse Generator Implant Date: 20170907
MDC IDC SESS DTM: 20171206130730

## 2016-07-26 NOTE — Progress Notes (Signed)
Carelink summary report received. Battery status OK. Normal device function. No new tachy episodes, brady, or pause episodes. No new AF episodes. 1 sx. episode appears narrow comp., slightly irregular. Monthly summary reports and ROV/PRN

## 2016-08-06 ENCOUNTER — Ambulatory Visit (INDEPENDENT_AMBULATORY_CARE_PROVIDER_SITE_OTHER): Payer: Medicare Other | Admitting: *Deleted

## 2016-08-06 DIAGNOSIS — I4891 Unspecified atrial fibrillation: Secondary | ICD-10-CM | POA: Diagnosis not present

## 2016-08-07 NOTE — Progress Notes (Signed)
Carelink Summary Report / Loop Recorder 

## 2016-08-20 LAB — CUP PACEART REMOTE DEVICE CHECK
MDC IDC PG IMPLANT DT: 20170907
MDC IDC SESS DTM: 20180105134144

## 2016-08-20 NOTE — Progress Notes (Signed)
Carelink summary report received. Battery status OK. Normal device function. No new  tachy episodes, brady, or pause episodes. 1 sx. episode appears SR w/ 7 beats of wide complex tachy. 1 AF- appears SR w/ PVC's. +Xarelto  Monthly summary reports and ROV/PRN

## 2016-08-22 ENCOUNTER — Encounter: Payer: Medicare Other | Admitting: Internal Medicine

## 2016-08-23 ENCOUNTER — Encounter: Payer: Self-pay | Admitting: Internal Medicine

## 2016-08-23 ENCOUNTER — Telehealth: Payer: Self-pay | Admitting: Internal Medicine

## 2016-08-23 NOTE — Telephone Encounter (Signed)
Emailed patient ROI for her to complete Once I get this back I will forward records

## 2016-08-23 NOTE — Telephone Encounter (Signed)
New message  Pt call requesting to speak with RN. Pt states she would like her medical records on her heart monitor to be sent to Dr. Deboraha Sprang office. Please call back to discuss

## 2016-08-26 ENCOUNTER — Other Ambulatory Visit: Payer: Self-pay | Admitting: Internal Medicine

## 2016-08-27 NOTE — Telephone Encounter (Signed)
Scr 0.88

## 2016-08-27 NOTE — Telephone Encounter (Signed)
PCP sending labs.

## 2016-09-01 LAB — CUP PACEART REMOTE DEVICE CHECK
Date Time Interrogation Session: 20180204134408
MDC IDC PG IMPLANT DT: 20170907

## 2016-09-01 NOTE — Progress Notes (Signed)
Carelink summary report received. Battery status OK. Normal device function. No new symptom episodes, tachy episodes, brady, or pause episodes. No new AF episodes. Monthly summary reports and ROV/PRN 

## 2016-09-04 ENCOUNTER — Ambulatory Visit (INDEPENDENT_AMBULATORY_CARE_PROVIDER_SITE_OTHER): Payer: Medicare Other | Admitting: *Deleted

## 2016-09-04 DIAGNOSIS — I4891 Unspecified atrial fibrillation: Secondary | ICD-10-CM

## 2016-09-04 NOTE — Progress Notes (Signed)
Carelink Summary Report / Loop Recorder 

## 2016-09-13 ENCOUNTER — Telehealth: Payer: Self-pay | Admitting: Cardiology

## 2016-09-13 NOTE — Telephone Encounter (Signed)
Spoke w/ pt and requested that she send a manual transmission b/c her home monitor has not updated in at least 14 days.   

## 2016-10-04 ENCOUNTER — Ambulatory Visit (INDEPENDENT_AMBULATORY_CARE_PROVIDER_SITE_OTHER): Payer: Medicare Other | Admitting: *Deleted

## 2016-10-04 DIAGNOSIS — I4891 Unspecified atrial fibrillation: Secondary | ICD-10-CM

## 2016-10-05 NOTE — Progress Notes (Signed)
Carelink Summary Report / Loop Recorder 

## 2016-10-06 LAB — CUP PACEART REMOTE DEVICE CHECK
Date Time Interrogation Session: 20180306143740
MDC IDC PG IMPLANT DT: 20170907

## 2016-10-13 LAB — CUP PACEART REMOTE DEVICE CHECK
Date Time Interrogation Session: 20180405143657
Implantable Pulse Generator Implant Date: 20170907

## 2016-10-13 NOTE — Progress Notes (Signed)
Carelink summary report received. Battery status OK. Normal device function. No new symptom episodes, tachy episodes, brady, or pause episodes. No new AF episodes. Monthly summary reports and ROV/PRN 

## 2016-10-15 ENCOUNTER — Ambulatory Visit (INDEPENDENT_AMBULATORY_CARE_PROVIDER_SITE_OTHER): Payer: Medicare Other | Admitting: Internal Medicine

## 2016-10-15 VITALS — BP 138/78 | HR 91 | Ht 59.0 in | Wt 227.0 lb

## 2016-10-15 DIAGNOSIS — I4891 Unspecified atrial fibrillation: Secondary | ICD-10-CM

## 2016-10-15 DIAGNOSIS — G4733 Obstructive sleep apnea (adult) (pediatric): Secondary | ICD-10-CM

## 2016-10-15 LAB — CUP PACEART INCLINIC DEVICE CHECK
Date Time Interrogation Session: 20180416110343
MDC IDC PG IMPLANT DT: 20170907

## 2016-10-15 NOTE — Progress Notes (Signed)
Electrophysiology Office Note   Date:  10/15/2016   ID:  Katie Owens, DOB 02/12/1951, MRN [id number]  PCP:  Marylene Land, MD  Cardiologist:  Dr Claiborne Billings Primary Electrophysiologist: Thompson Grayer, MD    Chief Complaint  Patient presents with  . Atrial Fibrillation     History of Present Illness: Katie Owens is a 66 y.o. female who presents today for electrophysiology evaluation.   She is s/p afib ablation at [facility] by Dr Clyda Hurdle in 2012.  She has not had symptoms of sustained AF since that time.  She underwent ILR implant by me to further evaluation palpitations and to better guide anticoagulation decisions.  Her palpitations are primarily PVCs. She did have nonsustained atach.  I do not see any afib.  Today, she denies symptoms of chest pain, shortness of breath, orthopnea, PND, lower extremity edema, claudication, dizziness, presyncope, syncope, bleeding, or neurologic sequela. The patient is tolerating medications without difficulties and is otherwise without complaint today.    Past Medical History:  Diagnosis Date  . Arthritis   . Asthma   . Cancer [facility]) 1995   breast cancer lt with 12 nodes out  . Chronic kidney disease    hx stones  . Contact lens/glasses fitting    wears contacts or glasses  . Depression   . Diabetes mellitus without complication ([place])   . Dysrhythmia    hx AF  . Fibromyalgia   . GERD (gastroesophageal reflux disease)   . Hyperlipemia   . Hypertension   . IBS (irritable bowel syndrome)   . PONV (postoperative nausea and vomiting)   . Seasonal allergies   . Shortness of breath   . Sleep apnea    uses a cpap   Past Surgical History:  Procedure Laterality Date  . BREAST LUMPECTOMY WITH AXILLARY LYMPH NODE DISSECTION  1995   12 nodes removed  . CARDIAC CATHETERIZATION  01/24/2009   left heart  . CARDIAC ELECTROPHYSIOLOGY [place] AND ABLATION  2012  . CARPAL TUNNEL RELEASE Right 10/14/2012   Procedure: CARPAL TUNNEL RELEASE;  Surgeon:  Wynonia Sours, MD;  Location: [place];  Service: Orthopedics;  Laterality: Right;  . COLONOSCOPY    . DIAGNOSTIC LAPAROSCOPY  1990  . EP IMPLANTABLE DEVICE N/A 03/08/2016   Procedure: Loop Recorder Insertion;  Surgeon: Thompson Grayer, MD;  Location: [place] [facility];  Service: Cardiovascular;  Laterality: N/A;  . LUMBAR FUSION    . TONSILLECTOMY       Current Outpatient Prescriptions  Medication Sig Dispense Refill  . ALPRAZolam (XANAX) 0.5 MG tablet Take 0.5 mg by mouth at bedtime as needed for sleep.   1  . Ascorbic Acid (VITAMIN C) 1000 MG tablet Take 2,000 mg by mouth daily.     . beclomethasone (QVAR) 80 MCG/ACT inhaler Inhale 1 puff into the lungs 2 (two) times daily.     [name] BIOTIN 5000 PO Take 1 tablet by mouth daily.    . carvedilol (COREG) 12.5 MG tablet Take 12.5 mg by mouth 2 (two) times daily.     . chlorthalidone (HYGROTON) 50 MG tablet Take 50 mg by mouth every morning.  1  . cholecalciferol (VITAMIN D) 1000 UNITS tablet Take 2,000 Units by mouth daily.     [name] co-enzyme Q-10 30 MG capsule Take 30 mg by mouth daily.     . cyclobenzaprine (FLEXERIL) 10 MG tablet Take 10 mg by mouth at bedtime.    [name] diltiazem (CARDIZEM) 30 MG tablet  Take 1 tablet by mouth every 4 hours AS NEEDED for HR>100 and BP>100    . diphenhydrAMINE (BENADRYL) 25 MG tablet Take 25 mg by mouth every 6 (six) hours as needed for allergies.    . DULoxetine (CYMBALTA) 60 MG capsule Take 1 capsule by mouth daily.    [name] etodolac (LODINE) 400 MG tablet 400 mg 2 (two) times daily.   7  . HYDROcodone-acetaminophen (NORCO) 5-325 MG per tablet Take 1 tablet by mouth every 6 (six) hours as needed for pain. 30 tablet 0  . loperamide (IMODIUM) 2 MG capsule Take 2 mg by mouth every morning.     [name] losartan (COZAAR) 100 MG tablet Take 100 mg by mouth daily.    . metFORMIN (GLUCOPHAGE-XR) 500 MG 24 hr tablet Take 2 tablets by mouth 2 (two) times daily.     . Multiple Vitamins-Minerals (MULTIVITAMIN ADULT PO)  Take 1 tablet by mouth daily.    [name] omeprazole (PRILOSEC) 20 MG capsule Take 20 mg by mouth daily.    . potassium chloride SA (K-DUR,KLOR-CON) 20 MEQ tablet Take 20 mEq by mouth daily.     . pregabalin (LYRICA) 75 MG capsule Take 75 mg by mouth 2 (two) times daily.    [name] pyridOXINE (VITAMIN B-6) 100 MG tablet Take 100 mg by mouth daily.    . rosuvastatin (CRESTOR) 10 MG tablet Take 10 mg by mouth daily.    . TRADJENTA 5 MG TABS tablet Take 5 mg by mouth every morning.  0  . VENTOLIN HFA 108 (90 BASE) MCG/ACT inhaler Inhale 1-2 puffs into the lungs as directed. As needed for shortness of breath or wheezing  3  . vitamin B-12 (CYANOCOBALAMIN) 1000 MCG tablet Take 5,000 mcg by mouth daily.     Alveda Reasons 20 MG TABS tablet TAKE 1 TABLET BY MOUTH EVERY DAY WITH SUPPER 30 tablet 5   No current facility-administered medications for this visit.     Allergies:   Penicillins; Aspirin; and Ibuprofen   Social History:  The patient  reports that she quit smoking about 37 years ago. Her smoking use included Cigarettes. She has a 30.00 pack-year smoking history. She has never used smokeless tobacco. She reports that she does not drink alcohol or use drugs.   Family History:  The patient's  family history includes Alcoholism in her brother, father, and mother; Anemia in her brother; Heart attack in her maternal grandmother; Heart disease in her brother; Hyperlipidemia in her brother and maternal grandmother; Hypertension in her brother, maternal grandmother, and mother; Other in her brother; Stroke in her mother.    ROS:  Please see the history of present illness.   All other systems are reviewed and negative.    PHYSICAL EXAM: VS:  BP 138/78   Pulse 91   Ht 4\' 11"  (1.499 m)   Wt 227 lb (103 kg)   SpO2 96%   BMI 45.85 kg/m  , BMI Body mass index is 45.85 kg/m. GEN: overweight in no acute distress  HEENT: normal  Neck: no JVD, carotid bruits, or masses Cardiac: RRR; no murmurs, rubs, or gallops,no  edema  Respiratory:  clear to auscultation bilaterally, normal work of breathing GI: soft, nontender, nondistended, + BS MS: no deformity or atrophy  Skin: warm and dry  Neuro:  Strength and sensation are intact Psych: euthymic mood, full affect  ILR is reviewed today personally    Lipid Panel     Component Value Date/Time   CHOL  01/24/2009  0420    140        ATP III CLASSIFICATION:  <200     mg/dL   Desirable  200-239  mg/dL   Borderline High  >=240    mg/dL   High          TRIG 106 01/24/2009 0420   HDL 61 01/24/2009 0420   CHOLHDL 2.3 01/24/2009 0420   VLDL 21 01/24/2009 0420   LDLCALC  01/24/2009 0420    58        Total Cholesterol/HDL:CHD Risk Coronary Heart Disease Risk Table                     Men   Women  1/2 Average Risk   3.4   3.3  Average Risk       5.0   4.4  2 X Average Risk   9.6   7.1  3 X Average Risk  23.4   11.0        Use the calculated Patient Ratio above and the CHD Risk Table to determine the patient's CHD Risk.        ATP III CLASSIFICATION (LDL):  <100     mg/dL   Optimal  100-129  mg/dL   Near or Above                    Optimal  130-159  mg/dL   Borderline  160-189  mg/dL   High  >190     mg/dL   Very High     Wt Readings from Last 3 Encounters:  10/15/16 227 lb (103 kg)  03/08/16 224 lb (101.6 kg)  03/07/16 224 lb 12.8 oz (102 kg)     ASSESSMENT AND PLAN:  1.  Palpitations Due to PVCs and nonsustained atach No changes at this time.  2. Paroxysmal afib Well controlled s/p ablation at [facility] chads2vasc score is at least 4.   As she has had no further AF post ablation and confirmed by ILR monitoring over the past 3 months, she is clear in her decision to stop anticoagulation at this time. She is aware that there is perhaps a slight increased risk of stroke with this approach.  IF we find afib, we will need to restart anticoagulation.  We also discussed Watchman LAAO as an option today which she declines. Lifestyle modification is  advised.  Without appropriate treatment of her diabetes, sleep apnea, and morbid obesity, she will almost certainly have further af in the future.  3. Obesity Body mass index is 45.85 kg/m. Weight loss and regular exercise advised.  She is making no progress with this.  4. OSA Compliance with therapy is essential to prevent af recurrence  Follow-up:  Carelink Return to see EP PA-C in 6 months I will see in a year  Current medicines are reviewed at length with the patient today.   The patient does not have concerns regarding her medicines.  The following changes were made today:  none  Signed, Thompson Grayer, MD  10/15/2016 10:03 AM     [facility] [address] [place] [address] [phone] (office) [phone] (fax)

## 2016-10-15 NOTE — Patient Instructions (Addendum)
Medication Instructions:  Your physician has recommended you make the following change in your medication:  1) Stop Xarelto   Labwork: None ordered   Testing/Procedures: None ordered   Follow-Up:   Remote monitoring is used to monitor your Pacemaker  from home. This monitoring reduces the number of office visits required to check your device to one time per year. It allows [place] to keep an eye on the functioning of your device to ensure it is working properly. You are scheduled for a device check from home on 01/14/17. You may send your transmission at any time that day. If you have a wireless device, the transmission will be sent automatically. After your physician reviews your transmission, you will receive a postcard with your next transmission date.   Your physician wants you to follow-up in: 6 months with Tommye Standard, PA You will receive a reminder letter in the mail two months in advance. If you don't receive a letter, please call our office to schedule the follow-up appointment.   Any Other Special Instructions Will Be Listed Below (If Applicable).     If you need a refill on your cardiac medications before your next appointment, please call your pharmacy.

## 2016-11-05 ENCOUNTER — Ambulatory Visit (INDEPENDENT_AMBULATORY_CARE_PROVIDER_SITE_OTHER): Payer: Medicare Other | Admitting: *Deleted

## 2016-11-05 ENCOUNTER — Encounter: Payer: Medicare Other | Admitting: *Deleted

## 2016-11-05 DIAGNOSIS — I4891 Unspecified atrial fibrillation: Secondary | ICD-10-CM

## 2016-11-06 NOTE — Progress Notes (Signed)
Carelink Summary Report / Loop Recorder 

## 2016-11-16 ENCOUNTER — Telehealth: Payer: Self-pay | Admitting: Cardiology

## 2016-11-16 NOTE — Telephone Encounter (Signed)
Pt called and stated that she believes she had an episode of Afib around 7:05-7:10 on Thursday 11-15-16. She stated that she became dizzy and she felt like she was going to pass out. She stated that she tried to use her symptom activator but she isn't sure if she used it correctly. Instructed pt to send a manual transmission. Pt verbalized understanding.

## 2016-11-16 NOTE — Telephone Encounter (Signed)
Transmission reviewed.  Reprogrammed symptom alerts on in Carelink.  Advised patient that transmission does show brief (~20sec duration) tachycardia episode from 11/15/16 at 1902.  Patient reiterated that she felt presyncopal during this episode.  Her BP after was 152/61.  The episode was so short that she did not think that she should take diltiazem.  She has felt fine since this episode.    Patient is concerned about her anticoagulation status and wants Dr. Jackalyn Lombard recommendations regarding whether she should resume taking Xarelto.  Advised that he will be back in the office on Monday and that we will call her back with Dr. Jackalyn Lombard recommendations after he reviews the episode ECG.  Patient verbalizes understanding and denies additional questions or concerns at this time.

## 2016-11-19 LAB — CUP PACEART REMOTE DEVICE CHECK
Date Time Interrogation Session: 20180505154037
Implantable Pulse Generator Implant Date: 20170907

## 2016-11-19 NOTE — Telephone Encounter (Signed)
LMOVM regarding tachy episode on 11/15/16. Reviewed episode with Dr. [name], advised episode appears nonsustained Atach. Per Dr. [name] to continue to monitor and recommends to continue current plan of stopping Xarelto as discussed at [place] 10/15/16. [facility] Phone number to call back.

## 2016-11-20 NOTE — Telephone Encounter (Signed)
Spoke with patient regarding tachy episode on 11/15/16. Advised patient Dr. [name] reviewed episode, ECG appears nonsustained Atrial tachycardia, recommends continue current plan and she should not resume the Xarelto as discussed at [place] 10/15/16. Advised patient to use symptom activator and call the [facility] Clinic if she has any further symptoms. Patient verbalizes understanding and was appreciative.

## 2016-12-03 ENCOUNTER — Ambulatory Visit (INDEPENDENT_AMBULATORY_CARE_PROVIDER_SITE_OTHER): Payer: Medicare Other | Admitting: *Deleted

## 2016-12-03 DIAGNOSIS — I4891 Unspecified atrial fibrillation: Secondary | ICD-10-CM | POA: Diagnosis not present

## 2016-12-04 NOTE — Progress Notes (Signed)
Carelink Summary Report / Loop Recorder 

## 2016-12-05 LAB — CUP PACEART REMOTE DEVICE CHECK
Date Time Interrogation Session: 20180604164928
Implantable Pulse Generator Implant Date: 20170907

## 2017-01-03 ENCOUNTER — Ambulatory Visit (INDEPENDENT_AMBULATORY_CARE_PROVIDER_SITE_OTHER): Payer: Medicare Other | Admitting: *Deleted

## 2017-01-03 DIAGNOSIS — I4891 Unspecified atrial fibrillation: Secondary | ICD-10-CM | POA: Diagnosis not present

## 2017-01-04 NOTE — Progress Notes (Signed)
Carelink Summary Report / Loop Recorder 

## 2017-01-07 LAB — CUP PACEART REMOTE DEVICE CHECK
Implantable Pulse Generator Implant Date: 20170907
MDC IDC SESS DTM: 20180704161036

## 2017-02-01 ENCOUNTER — Ambulatory Visit (INDEPENDENT_AMBULATORY_CARE_PROVIDER_SITE_OTHER): Payer: Medicare Other | Admitting: *Deleted

## 2017-02-01 DIAGNOSIS — I4891 Unspecified atrial fibrillation: Secondary | ICD-10-CM | POA: Diagnosis not present

## 2017-02-05 NOTE — Progress Notes (Signed)
Carelink Summary Report / Loop Recorder 

## 2017-02-12 LAB — CUP PACEART REMOTE DEVICE CHECK
Implantable Pulse Generator Implant Date: 20170907
MDC IDC SESS DTM: 20180803164017

## 2017-02-13 ENCOUNTER — Other Ambulatory Visit: Payer: Self-pay | Admitting: Internal Medicine

## 2017-03-05 ENCOUNTER — Ambulatory Visit (INDEPENDENT_AMBULATORY_CARE_PROVIDER_SITE_OTHER): Payer: Medicare Other | Admitting: *Deleted

## 2017-03-05 DIAGNOSIS — I4891 Unspecified atrial fibrillation: Secondary | ICD-10-CM | POA: Diagnosis not present

## 2017-03-06 NOTE — Progress Notes (Signed)
Carelink Summary Report / Loop Recorder 

## 2017-03-07 LAB — CUP PACEART REMOTE DEVICE CHECK
Date Time Interrogation Session: 20180902174022
MDC IDC PG IMPLANT DT: 20170907

## 2017-03-11 ENCOUNTER — Telehealth: Payer: Self-pay | Admitting: *Deleted

## 2017-03-11 NOTE — Telephone Encounter (Signed)
Spoke with patient, requested manual Carelink transmission for review of additional "AF" episode that did not transmit automatically.  Available ECG suggests ST w/PVCs.  Will review manual transmission with Dr. [name] when it is received.  Patient is agreeable to plan and denies additional questions or concerns at this time.

## 2017-03-12 NOTE — Telephone Encounter (Signed)
LMOVM (DPR) advising that manual transmission was received and will be reviewed by Dr. [name].  Advised we will call if any changes/recommendations.  [facility] phone number for questions/concerns.

## 2017-04-02 ENCOUNTER — Ambulatory Visit (INDEPENDENT_AMBULATORY_CARE_PROVIDER_SITE_OTHER): Payer: Medicare Other | Admitting: *Deleted

## 2017-04-02 DIAGNOSIS — I4891 Unspecified atrial fibrillation: Secondary | ICD-10-CM

## 2017-04-03 NOTE — Progress Notes (Signed)
Carelink Summary Report / Loop Recorder 

## 2017-04-04 LAB — CUP PACEART REMOTE DEVICE CHECK
MDC IDC PG IMPLANT DT: 20170907
MDC IDC SESS DTM: 20181002181105

## 2017-04-14 NOTE — Progress Notes (Addendum)
Cardiology Office Note Date:  04/16/2017  Patient ID:  Katie, Owens 14-Oct-1950, MRN [id number] PCP:  Derinda Late, MD  Electrophysiologist:  Dr. [name]   Chief Complaint: planned visit  History of Present Illness: Katie Owens is a 67 y.o. female with history of AFib s/p afib ablation at [facility] by Dr Clyda Hurdle in 2012, DM, fibromyalgia, HTN, HLD, OSA w/CPAP.   She comes today to be seen for Dr. [name], last seen by him in April.  At that time discussed her symptoms of palpitations prompted ILR implant, to date of that visit, palpitations were attributed to PVCs, a brief ATach had been observed, no AFib.  The patient opted to stop Xarelto at that time given no AFib.  Understanding that if AF were to be noted by her ILR would need to resume.  Telephone note a pre-syncopal spell with brief ATach  She is doing fairly well, her chronic back pain and fibromyalgia limit her, no CP, no syncope or SOB.  She feels like she has had a couple events (3) in the last month where she feels suddenly weak/fleetingly her vision starts to get tunneled and then stops she says with a very strong heart beat, these last no more then 4-6 seconds.  We discussed life style strategies such as weight loss and exercise.  She is limited with significant CBP/neck pain, but discussed chair exercises or other safe options for exercise.  Improtance of adequate hydration and avoidance of stimulants.  She reports compliance with her CPAP, no ETOH or smoking.  She reports "full labs" done 2 weeks ago with her PMD, all were reported as OK.   Device information: MDT ILR implanted 03/08/16, Dr. [name]  Past Medical History:  Diagnosis Date  . Arthritis   . Asthma   . Cancer [facility]) 1995   breast cancer lt with 12 nodes out  . Chronic kidney disease    hx stones  . Contact lens/glasses fitting    wears contacts or glasses  . Depression   . Diabetes mellitus without complication ([place])   . Dysrhythmia    hx AF  .  Fibromyalgia   . GERD (gastroesophageal reflux disease)   . Hyperlipemia   . Hypertension   . IBS (irritable bowel syndrome)   . PONV (postoperative nausea and vomiting)   . Seasonal allergies   . Shortness of breath   . Sleep apnea    uses a cpap    Past Surgical History:  Procedure Laterality Date  . BREAST LUMPECTOMY WITH AXILLARY LYMPH NODE DISSECTION  1995   12 nodes removed  . CARDIAC CATHETERIZATION  01/24/2009   left heart  . CARDIAC ELECTROPHYSIOLOGY [place] AND ABLATION  2012  . CARPAL TUNNEL RELEASE Right 10/14/2012   Procedure: CARPAL TUNNEL RELEASE;  Surgeon: Wynonia Sours, MD;  Location: [facility];  Service: Orthopedics;  Laterality: Right;  . COLONOSCOPY    . DIAGNOSTIC LAPAROSCOPY  1990  . EP IMPLANTABLE DEVICE N/A 03/08/2016   Procedure: Loop Recorder Insertion;  Surgeon: Thompson Grayer, MD;  Location: [place] [facility];  Service: Cardiovascular;  Laterality: N/A;  . LUMBAR FUSION    . TONSILLECTOMY      Current Outpatient Prescriptions  Medication Sig Dispense Refill  . ALPRAZolam (XANAX) 0.5 MG tablet Take 0.5 mg by mouth at bedtime as needed for sleep.   1  . Ascorbic Acid (VITAMIN C) 1000 MG tablet Take 2,000 mg by mouth daily.     [name]  beclomethasone (QVAR) 80 MCG/ACT inhaler Inhale 1 puff into the lungs 2 (two) times daily.     [name] BIOTIN 5000 PO Take 1 tablet by mouth daily.    . carvedilol (COREG) 12.5 MG tablet Take 12.5 mg by mouth 2 (two) times daily.     . chlorthalidone (HYGROTON) 50 MG tablet Take 50 mg by mouth every morning.  1  . cholecalciferol (VITAMIN D) 1000 UNITS tablet Take 2,000 Units by mouth daily.     [name] co-enzyme Q-10 30 MG capsule Take 30 mg by mouth daily.     . cyclobenzaprine (FLEXERIL) 10 MG tablet Take 10 mg by mouth at bedtime.    [name] diltiazem (CARDIZEM) 30 MG tablet Take 1 tablet by mouth every 4 hours AS NEEDED for HR>100 and BP>100    . diphenhydrAMINE (BENADRYL) 25 MG tablet Take 25 mg by mouth every 6 (six) hours as  needed for allergies.    . DULoxetine (CYMBALTA) 60 MG capsule Take 1 capsule by mouth daily.    [name] etodolac (LODINE) 400 MG tablet 400 mg 2 (two) times daily.   7  . HYDROcodone-acetaminophen (NORCO) 5-325 MG per tablet Take 1 tablet by mouth every 6 (six) hours as needed for pain. 30 tablet 0  . loperamide (IMODIUM) 2 MG capsule Take 2 mg by mouth every morning.     [name] losartan (COZAAR) 100 MG tablet Take 100 mg by mouth daily.    . metFORMIN (GLUCOPHAGE-XR) 500 MG 24 hr tablet Take 2 tablets by mouth 2 (two) times daily.     . Multiple Vitamins-Minerals (MULTIVITAMIN ADULT PO) Take 1 tablet by mouth daily.    [name] omeprazole (PRILOSEC) 20 MG capsule Take 20 mg by mouth daily.    . potassium chloride SA (K-DUR,KLOR-CON) 20 MEQ tablet Take 20 mEq by mouth daily.     . pregabalin (LYRICA) 75 MG capsule Take 75 mg by mouth 2 (two) times daily.    [name] pyridOXINE (VITAMIN B-6) 100 MG tablet Take 100 mg by mouth daily.    . rosuvastatin (CRESTOR) 10 MG tablet Take 10 mg by mouth daily.    . TRADJENTA 5 MG TABS tablet Take 5 mg by mouth every morning.  0  . VENTOLIN HFA 108 (90 BASE) MCG/ACT inhaler Inhale 1-2 puffs into the lungs as directed. As needed for shortness of breath or wheezing  3  . vitamin B-12 (CYANOCOBALAMIN) 1000 MCG tablet Take 5,000 mcg by mouth daily.      No current facility-administered medications for this visit.     Allergies:   Penicillins; Aspirin; and Ibuprofen   Social History:  The patient  reports that she quit smoking about 37 years ago. Her smoking use included Cigarettes. She has a 30.00 pack-year smoking history. She has never used smokeless tobacco. She reports that she does not drink alcohol or use drugs.   Family History:  The patient's family history includes Alcoholism in her brother, father, and mother; Anemia in her brother; Heart attack in her maternal grandmother; Heart disease in her brother; Hyperlipidemia in her brother and maternal grandmother; Hypertension  in her brother, maternal grandmother, and mother; Other in her brother; Stroke in her mother.  ROS:  Please see the history of present illness.  All other systems are reviewed and otherwise negative.   PHYSICAL EXAM:  VS:  BP 120/62   Pulse 97   Ht 4\' 11"  (1.499 m)   Wt 230 lb (104.3 kg)   BMI 46.45 kg/m  BMI: Body mass index is 46.45 kg/m. Well nourished, well developed, in no acute distress  HEENT: normocephalic, atraumatic  Neck: no JVD, carotid bruits or masses Cardiac:  RRR; no significant murmurs, no rubs, or gallops Lungs:  CTA b/l, no wheezing, rhonchi or rales  Abd: soft, non-tender, obese MS: no deformity or atrophy Ext:  no edema  Skin: warm and dry, no rash Neuro:  No gross deficits appreciated Psych: euthymic mood, full affect  ILR site is stable, no tethering or discomfort   EKG:  Done today and reviewed by myself shows SR, 97bpm, PR 187ms, QRS 24ms, Qtc 425ms ILR interrogation done today and reviewed by myself: No AF episodes, 3 symptom episodes, one is 4 beat NSVT, the others look like brief AT, similar to prior events.  False AF episodes with undersensing and PACs.   09/11/10: Cardiac CT  Non-Cardiac Findings: Vascular calcification in the left breast. Apparent asymmetrically increasedglandular tissue in the left breast may be artifactual since a large lateral portion of the right breast is not included in the field of view. Mammography is suggested if the patient has not had a recent mammogram. Impressions: - Pulmonary vein anatomy as noted above - Mildly dilated left atrium - Upper normal to mIldly dilated pulmonary arteries - Breast findings as noted above   01/24/09: LHC Normal coronaries Normal LVEF No significant AS or MR  Recent Labs: No results found for requested labs within last 8760 hours.  No results found for requested labs within last 8760 hours.   CrCl cannot be calculated (Patient's most recent lab result is older than the  maximum 21 days allowed.).   Wt Readings from Last 3 Encounters:  04/16/17 230 lb (104.3 kg)  10/15/16 227 lb (103 kg)  03/08/16 224 lb (101.6 kg)     Other studies reviewed: Additional studies/records reviewed today include: summarized above  ASSESSMENT AND PLAN:  1. AFib s/p AF ablation 2012     CHA2DS2Vasc is at least 4, off a/c post ablation w/o recurrent AF     Continue to monitor via ILR     No AF to date  2. HTN     Looks OK  3. ATach     brief AT episodes associated with her symptom episodes, one 4 beat NSVT     Her symptoms are less frequent and shorter then her AF symptoms ever were     Post ablation palpitations and brief weak spells attributed to her AT and PVCs also are improved, but not resolved Since she is symptomatic with these, will increase her Coreg to 25mg  BID and reduce her cozaar to 50mg  daily She is asked to let us know if she feels like her asthma is bothered by the change Symptoms are too brief for her PRN dilt   Disposition: F/u 3-4 months, sooner if needed  Current medicines are reviewed at length with the patient today.  The patient did not have any concerns regarding medicines.  Venetia Night, PA-C 04/16/2017 2:30 PM     [address] [place] [address] [phone] (office)  [phone] (fax)

## 2017-04-16 ENCOUNTER — Ambulatory Visit (INDEPENDENT_AMBULATORY_CARE_PROVIDER_SITE_OTHER): Payer: Medicare Other | Admitting: Physician Assistant

## 2017-04-16 ENCOUNTER — Encounter (INDEPENDENT_AMBULATORY_CARE_PROVIDER_SITE_OTHER): Payer: Self-pay

## 2017-04-16 VITALS — BP 120/62 | HR 97 | Ht 59.0 in | Wt 230.0 lb

## 2017-04-16 DIAGNOSIS — I4719 Other supraventricular tachycardia: Secondary | ICD-10-CM

## 2017-04-16 DIAGNOSIS — I471 Supraventricular tachycardia: Secondary | ICD-10-CM | POA: Diagnosis not present

## 2017-04-16 DIAGNOSIS — I10 Essential (primary) hypertension: Secondary | ICD-10-CM | POA: Diagnosis not present

## 2017-04-16 DIAGNOSIS — Z4509 Encounter for adjustment and management of other cardiac device: Secondary | ICD-10-CM

## 2017-04-16 DIAGNOSIS — I480 Paroxysmal atrial fibrillation: Secondary | ICD-10-CM | POA: Diagnosis not present

## 2017-04-16 MED ORDER — LOSARTAN POTASSIUM 50 MG PO TABS: 50.0000 mg | ORAL_TABLET | Freq: Every day | ORAL | 4 refills | Status: DC

## 2017-04-16 MED ORDER — CARVEDILOL 25 MG PO TABS: 25.0000 mg | ORAL_TABLET | Freq: Two times a day (BID) | ORAL | 4 refills | Status: DC

## 2017-04-16 NOTE — Patient Instructions (Addendum)
Medication Instructions:   START TAKING COREG 25 MG TWICE A DAY   START TAKING COZAAR 50 MG ONCE A DAY    If you need a refill on your cardiac medications before your next appointment, please call your pharmacy.  Labwork: NONE ORDERED  TODAY   Testing/Procedures: NONE ORDERED  TODAY    Follow-Up: IN 3 TO 4 MONTHS WITH RENEE URSUY   Any Other Special Instructions Will Be Listed Below (If Applicable).

## 2017-04-22 ENCOUNTER — Other Ambulatory Visit: Payer: Self-pay | Admitting: Physician Assistant

## 2017-04-22 MED ORDER — LOSARTAN POTASSIUM 50 MG PO TABS: 50.0000 mg | ORAL_TABLET | Freq: Every day | ORAL | 4 refills | Status: DC

## 2017-05-02 ENCOUNTER — Ambulatory Visit (INDEPENDENT_AMBULATORY_CARE_PROVIDER_SITE_OTHER): Payer: Medicare Other | Admitting: *Deleted

## 2017-05-02 DIAGNOSIS — I480 Paroxysmal atrial fibrillation: Secondary | ICD-10-CM | POA: Diagnosis not present

## 2017-05-03 NOTE — Progress Notes (Signed)
Carelink Summary Report / Loop Recorder 

## 2017-05-06 LAB — CUP PACEART REMOTE DEVICE CHECK
Implantable Pulse Generator Implant Date: 20170907
MDC IDC SESS DTM: 20181101183944

## 2017-05-07 ENCOUNTER — Other Ambulatory Visit: Payer: Self-pay | Admitting: Internal Medicine

## 2017-05-10 ENCOUNTER — Other Ambulatory Visit: Payer: Self-pay | Admitting: Internal Medicine

## 2017-06-03 ENCOUNTER — Ambulatory Visit (INDEPENDENT_AMBULATORY_CARE_PROVIDER_SITE_OTHER): Payer: Medicare Other | Admitting: *Deleted

## 2017-06-03 DIAGNOSIS — I480 Paroxysmal atrial fibrillation: Secondary | ICD-10-CM

## 2017-06-03 NOTE — Progress Notes (Signed)
Carelink Summary Report / Loop Recorder 

## 2017-06-11 LAB — CUP PACEART REMOTE DEVICE CHECK
Date Time Interrogation Session: 20181201193806
Implantable Pulse Generator Implant Date: 20170907

## 2017-07-01 ENCOUNTER — Ambulatory Visit (INDEPENDENT_AMBULATORY_CARE_PROVIDER_SITE_OTHER): Payer: Medicare Other | Admitting: *Deleted

## 2017-07-01 DIAGNOSIS — I471 Supraventricular tachycardia: Secondary | ICD-10-CM

## 2017-07-01 DIAGNOSIS — I4719 Other supraventricular tachycardia: Secondary | ICD-10-CM

## 2017-07-03 NOTE — Progress Notes (Signed)
Carelink Summary Report / Loop Recorder 

## 2017-07-16 ENCOUNTER — Other Ambulatory Visit: Payer: Self-pay | Admitting: Family Medicine

## 2017-07-16 DIAGNOSIS — R1011 Right upper quadrant pain: Secondary | ICD-10-CM

## 2017-07-16 LAB — CUP PACEART REMOTE DEVICE CHECK
Implantable Pulse Generator Implant Date: 20170907
MDC IDC SESS DTM: 20181231193758

## 2017-07-16 NOTE — Progress Notes (Deleted)
Cardiology Office Note Date:  07/16/2017  Patient ID:  Katie, Owens [birth year], MRN [id number] PCP:  Derinda Late, MD  Electrophysiologist:  Dr. [name]   Chief Complaint: planned visit  History of Present Illness: Katie Owens is a 67 y.o. female with history of AFib s/p afib ablation at [facility] by Dr Clyda Hurdle in 2012, DM, fibromyalgia, HTN, HLD, OSA w/CPAP.   She comes today to be seen for Dr. [name], last seen by him in April.  At that time discussed her symptoms of palpitations prompted ILR implant, to date of that visit, palpitations were attributed to PVCs, a brief ATach had been observed, no AFib.  The patient opted to stop Xarelto at that time given no AFib.  Understanding that if AF were to be noted by her ILR would need to resume.  Telephone note a pre-syncopal spell with brief ATach  She was seen by myself in October 2018, was doing fairly well, her chronic back pain and fibromyalgia limit her, no c/o CP, no syncope or SOB.  She felt like she has had a couple events (3) in the last month where she felt suddenly weak/fleetingly her vision starts to get tunneled and then stops she says with a very strong heart beat, these last no more then 4-6 seconds.  We discussed life style strategies such as weight loss and exercise.  She was limited with significant CBP/neck pain, but discussed chair exercises or other safe options for exercise.  Improtance of adequate hydration and avoidance of stimulants.  She reported compliance with her CPAP, no ETOH or smoking, reported "full labs" done 2 weeks prior with her PMD, all were reported as OK.  Her coreg was increased her losartan reduced with symptomatic ectopy noted on her ILR, no AF to date of that visit had yet been noted.  *** symptoms, palps *** meds *** labs, PMD *** ILR/AF?   Device information: MDT ILR implanted 03/08/16, Dr. [name]  Past Medical History:  Diagnosis Date  . Arthritis   . Asthma   . Cancer [facility]) 1995   breast cancer lt with 12 nodes out  . Chronic kidney disease    hx stones  . Contact lens/glasses fitting    wears contacts or glasses  . Depression   . Diabetes mellitus without complication ([place])   . Dysrhythmia    hx AF  . Fibromyalgia   . GERD (gastroesophageal reflux disease)   . Hyperlipemia   . Hypertension   . IBS (irritable bowel syndrome)   . PONV (postoperative nausea and vomiting)   . Seasonal allergies   . Shortness of breath   . Sleep apnea    uses a cpap    Past Surgical History:  Procedure Laterality Date  . BREAST LUMPECTOMY WITH AXILLARY LYMPH NODE DISSECTION  1995   12 nodes removed  . CARDIAC CATHETERIZATION  01/24/2009   left heart  . CARDIAC ELECTROPHYSIOLOGY [place] AND ABLATION  2012  . CARPAL TUNNEL RELEASE Right 10/14/2012   Procedure: CARPAL TUNNEL RELEASE;  Surgeon: Wynonia Sours, MD;  Location: [facility];  Service: Orthopedics;  Laterality: Right;  . COLONOSCOPY    . DIAGNOSTIC LAPAROSCOPY  1990  . EP IMPLANTABLE DEVICE N/A 03/08/2016   Procedure: Loop Recorder Insertion;  Surgeon: Thompson Grayer, MD;  Location: [place] [facility];  Service: Cardiovascular;  Laterality: N/A;  . LUMBAR FUSION    . TONSILLECTOMY      Current Outpatient Medications  Medication Sig Dispense  Refill  . ALPRAZolam (XANAX) 0.5 MG tablet Take 0.5 mg by mouth at bedtime as needed for sleep.   1  . Ascorbic Acid (VITAMIN C) 1000 MG tablet Take 2,000 mg by mouth daily.     . beclomethasone (QVAR) 80 MCG/ACT inhaler Inhale 1 puff into the lungs 2 (two) times daily.     [name] BIOTIN 5000 PO Take 1 tablet by mouth daily.    . carvedilol (COREG) 25 MG tablet Take 1 tablet (25 mg total) by mouth 2 (two) times daily. 60 tablet 4  . chlorthalidone (HYGROTON) 50 MG tablet Take 50 mg by mouth every morning.  1  . cholecalciferol (VITAMIN D) 1000 UNITS tablet Take 2,000 Units by mouth daily.     [name] co-enzyme Q-10 30 MG capsule Take 30 mg by mouth daily.     .  cyclobenzaprine (FLEXERIL) 10 MG tablet Take 10 mg by mouth at bedtime.    [name] diltiazem (CARDIZEM) 30 MG tablet Take 1 tablet by mouth every 4 hours AS NEEDED for HR>100 and BP>100    . diphenhydrAMINE (BENADRYL) 25 MG tablet Take 25 mg by mouth every 6 (six) hours as needed for allergies.    . DULoxetine (CYMBALTA) 60 MG capsule Take 1 capsule by mouth daily.    [name] etodolac (LODINE) 400 MG tablet 400 mg 2 (two) times daily.   7  . HYDROcodone-acetaminophen (NORCO) 5-325 MG per tablet Take 1 tablet by mouth every 6 (six) hours as needed for pain. 30 tablet 0  . loperamide (IMODIUM) 2 MG capsule Take 2 mg by mouth every morning.     [name] losartan (COZAAR) 50 MG tablet Take 1 tablet (50 mg total) by mouth daily. 30 tablet 4  . metFORMIN (GLUCOPHAGE-XR) 500 MG 24 hr tablet Take 2 tablets by mouth 2 (two) times daily.     . Multiple Vitamins-Minerals (MULTIVITAMIN ADULT PO) Take 1 tablet by mouth daily.    [name] omeprazole (PRILOSEC) 20 MG capsule Take 20 mg by mouth daily.    . potassium chloride SA (K-DUR,KLOR-CON) 20 MEQ tablet Take 20 mEq by mouth daily.     . pregabalin (LYRICA) 75 MG capsule Take 75 mg by mouth 2 (two) times daily.    [name] pyridOXINE (VITAMIN B-6) 100 MG tablet Take 100 mg by mouth daily.    . rosuvastatin (CRESTOR) 10 MG tablet Take 10 mg by mouth daily.    . TRADJENTA 5 MG TABS tablet Take 5 mg by mouth every morning.  0  . VENTOLIN HFA 108 (90 BASE) MCG/ACT inhaler Inhale 1-2 puffs into the lungs as directed. As needed for shortness of breath or wheezing  3  . vitamin B-12 (CYANOCOBALAMIN) 1000 MCG tablet Take 5,000 mcg by mouth daily.      No current facility-administered medications for this visit.     Allergies:   Penicillins; Aspirin; and Ibuprofen   Social History:  The patient  reports that she quit smoking about 37 years ago. Her smoking use included cigarettes. She has a 30.00 pack-year smoking history. she has never used smokeless tobacco. She reports that she does not  drink alcohol or use drugs.   Family History:  The patient's family history includes Alcoholism in her brother, father, and mother; Anemia in her brother; Heart attack in her maternal grandmother; Heart disease in her brother; Hyperlipidemia in her brother and maternal grandmother; Hypertension in her brother, maternal grandmother, and mother; Other in her brother; Stroke in her mother.  ROS:  Please see the history of present illness.  All other systems are reviewed and otherwise negative.   PHYSICAL EXAM:  VS:  There were no vitals taken for this visit. BMI: There is no height or weight on file to calculate BMI. Well nourished, well developed, in no acute distress  HEENT: normocephalic, atraumatic  Neck: no JVD, carotid bruits or masses Cardiac:  *** RRR; no significant murmurs, no rubs, or gallops Lungs:  *** CTA b/l, no wheezing, rhonchi or rales  Abd: soft, non-tender, *** obese MS: no deformity or *** atrophy Ext:  *** no edema  Skin: warm and dry, no rash Neuro:  No gross deficits appreciated Psych: euthymic mood, full affect  *** ILR site is stable, no tethering or discomfort   EKG:  Not done today ILR interrogation done today and reviewed by myself: No AF episodes, 3 symptom episodes, one is 4 beat NSVT, the others look like brief AT, similar to prior events.  False AF episodes with undersensing and PACs.   09/11/10: Cardiac CT  Non-Cardiac Findings: Vascular calcification in the left breast. Apparent asymmetrically increasedglandular tissue in the left breast may be artifactual since a large lateral portion of the right breast is not included in the field of view. Mammography is suggested if the patient has not had a recent mammogram. Impressions: - Pulmonary vein anatomy as noted above - Mildly dilated left atrium - Upper normal to mIldly dilated pulmonary arteries - Breast findings as noted above   01/24/09: LHC Normal coronaries Normal LVEF No significant AS  or MR  Recent Labs: No results found for requested labs within last 8760 hours.  No results found for requested labs within last 8760 hours.   CrCl cannot be calculated (Patient's most recent lab result is older than the maximum 21 days allowed.).   Wt Readings from Last 3 Encounters:  04/16/17 230 lb (104.3 kg)  10/15/16 227 lb (103 kg)  03/08/16 224 lb (101.6 kg)     Other studies reviewed: Additional studies/records reviewed today include: summarized above  ASSESSMENT AND PLAN:  1. AFib s/p AF ablation 2012     CHA2DS2Vasc is at least 4, off a/c post ablation w/o recurrent AF     Continue to monitor via ILR     *** No AF to date  2. HTN     *** Looks OK  3. ATach     *** brief AT episodes associated with her symptom episodes, one 4 beat NSVT     *** Her symptoms are less frequent and shorter then her AF symptoms ever were     Post ablation palpitations and brief weak spells attributed to her AT and PVCs also are improved, but not resolved   Disposition: ***  Current medicines are reviewed at length with the patient today.  The patient did not have any concerns regarding medicines.  Venetia Night, PA-C 07/16/2017 12:48 PM     [place] [address] [phone] (office)  [phone] (fax)

## 2017-07-17 ENCOUNTER — Encounter: Payer: Medicare Other | Admitting: Physician Assistant

## 2017-07-19 ENCOUNTER — Ambulatory Visit
Admission: RE | Admit: 2017-07-19 | Discharge: 2017-07-19 | Disposition: A | Discharge status: Home / Self Care | Payer: Medicare Other | Source: Ambulatory Visit | Attending: Family Medicine | Admitting: Family Medicine

## 2017-07-19 DIAGNOSIS — R1011 Right upper quadrant pain: Secondary | ICD-10-CM

## 2017-07-21 ENCOUNTER — Other Ambulatory Visit: Payer: Self-pay | Admitting: Family Medicine

## 2017-07-21 DIAGNOSIS — R42 Dizziness and giddiness: Secondary | ICD-10-CM

## 2017-07-21 DIAGNOSIS — R413 Other amnesia: Secondary | ICD-10-CM

## 2017-07-29 ENCOUNTER — Ambulatory Visit
Admission: RE | Admit: 2017-07-29 | Discharge: 2017-07-29 | Disposition: A | Discharge status: Home / Self Care | Payer: Medicare Other | Source: Ambulatory Visit | Attending: Family Medicine | Admitting: Family Medicine

## 2017-07-29 DIAGNOSIS — R42 Dizziness and giddiness: Secondary | ICD-10-CM

## 2017-07-29 DIAGNOSIS — R413 Other amnesia: Secondary | ICD-10-CM

## 2017-07-29 MED ORDER — GADOBENATE DIMEGLUMINE 529 MG/ML IV SOLN
20.0000 mL | Freq: Once | INTRAVENOUS | Status: AC | PRN
  Administered 2017-07-29: 20 mL via INTRAVENOUS

## 2017-07-29 NOTE — Progress Notes (Deleted)
Cardiology Office Note Date:  07/29/2017  Patient ID:  Katie Owens, Katie Owens [birth year], MRN [id number] PCP:  Derinda Late, MD  Electrophysiologist:  Dr. [name]   Chief Complaint: planned visit  History of Present Illness: Katie Owens is a 67 y.o. female with history of AFib s/p afib ablation at [facility] by Dr Clyda Hurdle in 2012, DM, fibromyalgia, HTN, HLD, OSA w/CPAP.   She comes today to be seen for Dr. [name], last seen by him in April.  At that time discussed her symptoms of palpitations prompted ILR implant, to date of that visit, palpitations were attributed to PVCs, a brief ATach had been observed, no AFib.  The patient opted to stop Xarelto at that time given no AFib.  Understanding that if AF were to be noted by her ILR would need to resume.  Telephone note a pre-syncopal spell with brief ATach  She was seen by myself in October 2018, was doing fairly well, her chronic back pain and fibromyalgia limit her, no c/o CP, no syncope or SOB.  She felt like she has had a couple events (3) in the last month where she felt suddenly weak/fleetingly her vision starts to get tunneled and then stops she says with a very strong heart beat, these last no more then 4-6 seconds.  We discussed life style strategies such as weight loss and exercise.  She was limited with significant CBP/neck pain, but discussed chair exercises or other safe options for exercise.  Improtance of adequate hydration and avoidance of stimulants.  She reported compliance with her CPAP, no ETOH or smoking, reported "full labs" done 2 weeks prior with her PMD, all were reported as OK.  Her coreg was increased her losartan reduced with symptomatic ectopy noted on her ILR, no AF to date of that visit had yet been noted.  *** symptoms, palps *** meds *** labs, PMD *** ILR/AF?   Device information: MDT ILR implanted 03/08/16, Dr. [name]  Past Medical History:  Diagnosis Date  . Arthritis   . Asthma   . Cancer [facility]) 1995   breast cancer lt with 12 nodes out  . Chronic kidney disease    hx stones  . Contact lens/glasses fitting    wears contacts or glasses  . Depression   . Diabetes mellitus without complication ([place])   . Dysrhythmia    hx AF  . Fibromyalgia   . GERD (gastroesophageal reflux disease)   . Hyperlipemia   . Hypertension   . IBS (irritable bowel syndrome)   . PONV (postoperative nausea and vomiting)   . Seasonal allergies   . Shortness of breath   . Sleep apnea    uses a cpap    Past Surgical History:  Procedure Laterality Date  . BREAST LUMPECTOMY WITH AXILLARY LYMPH NODE DISSECTION  1995   12 nodes removed  . CARDIAC CATHETERIZATION  01/24/2009   left heart  . CARDIAC ELECTROPHYSIOLOGY [place] AND ABLATION  2012  . CARPAL TUNNEL RELEASE Right 10/14/2012   Procedure: CARPAL TUNNEL RELEASE;  Surgeon: Wynonia Sours, MD;  Location: [place];  Service: Orthopedics;  Laterality: Right;  . COLONOSCOPY    . DIAGNOSTIC LAPAROSCOPY  1990  . EP IMPLANTABLE DEVICE N/A 03/08/2016   Procedure: Loop Recorder Insertion;  Surgeon: Thompson Grayer, MD;  Location: [place] [facility];  Service: Cardiovascular;  Laterality: N/A;  . LUMBAR FUSION    . TONSILLECTOMY      Current Outpatient Medications  Medication Sig Dispense  Refill  . ALPRAZolam (XANAX) 0.5 MG tablet Take 0.5 mg by mouth at bedtime as needed for sleep.   1  . Ascorbic Acid (VITAMIN C) 1000 MG tablet Take 2,000 mg by mouth daily.     . beclomethasone (QVAR) 80 MCG/ACT inhaler Inhale 1 puff into the lungs 2 (two) times daily.     [name] BIOTIN 5000 PO Take 1 tablet by mouth daily.    . carvedilol (COREG) 25 MG tablet Take 1 tablet (25 mg total) by mouth 2 (two) times daily. 60 tablet 4  . chlorthalidone (HYGROTON) 50 MG tablet Take 50 mg by mouth every morning.  1  . cholecalciferol (VITAMIN D) 1000 UNITS tablet Take 2,000 Units by mouth daily.     [name] co-enzyme Q-10 30 MG capsule Take 30 mg by mouth daily.     .  cyclobenzaprine (FLEXERIL) 10 MG tablet Take 10 mg by mouth at bedtime.    [name] diltiazem (CARDIZEM) 30 MG tablet Take 1 tablet by mouth every 4 hours AS NEEDED for HR>100 and BP>100    . diphenhydrAMINE (BENADRYL) 25 MG tablet Take 25 mg by mouth every 6 (six) hours as needed for allergies.    . DULoxetine (CYMBALTA) 60 MG capsule Take 1 capsule by mouth daily.    [name] etodolac (LODINE) 400 MG tablet 400 mg 2 (two) times daily.   7  . HYDROcodone-acetaminophen (NORCO) 5-325 MG per tablet Take 1 tablet by mouth every 6 (six) hours as needed for pain. 30 tablet 0  . loperamide (IMODIUM) 2 MG capsule Take 2 mg by mouth every morning.     [name] losartan (COZAAR) 50 MG tablet Take 1 tablet (50 mg total) by mouth daily. 30 tablet 4  . metFORMIN (GLUCOPHAGE-XR) 500 MG 24 hr tablet Take 2 tablets by mouth 2 (two) times daily.     . Multiple Vitamins-Minerals (MULTIVITAMIN ADULT PO) Take 1 tablet by mouth daily.    [name] omeprazole (PRILOSEC) 20 MG capsule Take 20 mg by mouth daily.    . potassium chloride SA (K-DUR,KLOR-CON) 20 MEQ tablet Take 20 mEq by mouth daily.     . pregabalin (LYRICA) 75 MG capsule Take 75 mg by mouth 2 (two) times daily.    [name] pyridOXINE (VITAMIN B-6) 100 MG tablet Take 100 mg by mouth daily.    . rosuvastatin (CRESTOR) 10 MG tablet Take 10 mg by mouth daily.    . TRADJENTA 5 MG TABS tablet Take 5 mg by mouth every morning.  0  . VENTOLIN HFA 108 (90 BASE) MCG/ACT inhaler Inhale 1-2 puffs into the lungs as directed. As needed for shortness of breath or wheezing  3  . vitamin B-12 (CYANOCOBALAMIN) 1000 MCG tablet Take 5,000 mcg by mouth daily.      No current facility-administered medications for this visit.     Allergies:   Penicillins; Aspirin; and Ibuprofen   Social History:  The patient  reports that she quit smoking about 37 years ago. Her smoking use included cigarettes. She has a 30.00 pack-year smoking history. she has never used smokeless tobacco. She reports that she does not  drink alcohol or use drugs.   Family History:  The patient's family history includes Alcoholism in her brother, father, and mother; Anemia in her brother; Heart attack in her maternal grandmother; Heart disease in her brother; Hyperlipidemia in her brother and maternal grandmother; Hypertension in her brother, maternal grandmother, and mother; Other in her brother; Stroke in her mother.  ROS:  Please see the history of present illness.  All other systems are reviewed and otherwise negative.   PHYSICAL EXAM:  VS:  There were no vitals taken for this visit. BMI: There is no height or weight on file to calculate BMI. Well nourished, well developed, in no acute distress  HEENT: normocephalic, atraumatic  Neck: no JVD, carotid bruits or masses Cardiac:  *** RRR; no significant murmurs, no rubs, or gallops Lungs:  *** CTA b/l, no wheezing, rhonchi or rales  Abd: soft, non-tender, *** obese MS: no deformity or *** atrophy Ext:  *** no edema  Skin: warm and dry, no rash Neuro:  No gross deficits appreciated Psych: euthymic mood, full affect  *** ILR site is stable, no tethering or discomfort   EKG:  Not done today ILR interrogation done today and reviewed by myself: ***  ***No AF episodes, 3 symptom episodes, one is 4 beat NSVT, the others look like brief AT, similar to prior events.  False AF episodes with undersensing and PACs.   09/11/10: Cardiac CT  Non-Cardiac Findings: Vascular calcification in the left breast. Apparent asymmetrically increasedglandular tissue in the left breast may be artifactual since a large lateral portion of the right breast is not included in the field of view. Mammography is suggested if the patient has not had a recent mammogram. Impressions: - Pulmonary vein anatomy as noted above - Mildly dilated left atrium - Upper normal to mIldly dilated pulmonary arteries - Breast findings as noted above   01/24/09: LHC Normal coronaries Normal LVEF No  significant AS or MR  Recent Labs: No results found for requested labs within last 8760 hours.  No results found for requested labs within last 8760 hours.   CrCl cannot be calculated (Patient's most recent lab result is older than the maximum 21 days allowed.).   Wt Readings from Last 3 Encounters:  04/16/17 230 lb (104.3 kg)  10/15/16 227 lb (103 kg)  03/08/16 224 lb (101.6 kg)     Other studies reviewed: Additional studies/records reviewed today include: summarized above  ASSESSMENT AND PLAN:  1. AFib s/p AF ablation 2012     CHA2DS2Vasc is at least 4, off a/c post ablation w/o recurrent AF     Continue to monitor via ILR     *** No AF to date  2. HTN     *** Looks OK  3. ATach     *** brief AT episodes associated with her symptom episodes, one 4 beat NSVT     *** Her symptoms are less frequent and shorter then her AF symptoms ever were     Post ablation palpitations and brief weak spells attributed to her AT and PVCs also are improved, but not resolved   Disposition: ***  Current medicines are reviewed at length with the patient today.  The patient did not have any concerns regarding medicines.  Venetia Night, PA-C 07/29/2017 7:33 AM     [address] [place] [address] [phone] (office)  [phone] (fax)

## 2017-07-31 ENCOUNTER — Ambulatory Visit (INDEPENDENT_AMBULATORY_CARE_PROVIDER_SITE_OTHER): Payer: Medicare Other | Admitting: *Deleted

## 2017-07-31 DIAGNOSIS — I480 Paroxysmal atrial fibrillation: Secondary | ICD-10-CM | POA: Diagnosis not present

## 2017-08-01 ENCOUNTER — Encounter: Payer: Medicare Other | Admitting: Physician Assistant

## 2017-08-01 NOTE — Progress Notes (Signed)
Carelink Summary Report / Loop Recorder 

## 2017-08-13 LAB — CUP PACEART REMOTE DEVICE CHECK
Implantable Pulse Generator Implant Date: 20170907
MDC IDC SESS DTM: 20190130203946

## 2017-09-02 ENCOUNTER — Ambulatory Visit (INDEPENDENT_AMBULATORY_CARE_PROVIDER_SITE_OTHER): Payer: Medicare Other | Admitting: *Deleted

## 2017-09-02 DIAGNOSIS — I480 Paroxysmal atrial fibrillation: Secondary | ICD-10-CM | POA: Diagnosis not present

## 2017-09-03 ENCOUNTER — Other Ambulatory Visit: Payer: Self-pay | Admitting: Family Medicine

## 2017-09-03 ENCOUNTER — Ambulatory Visit
Admission: RE | Admit: 2017-09-03 | Discharge: 2017-09-03 | Disposition: A | Discharge status: Home / Self Care | Payer: Medicare Other | Source: Ambulatory Visit | Attending: Family Medicine | Admitting: Family Medicine

## 2017-09-03 DIAGNOSIS — M545 Low back pain, unspecified: Secondary | ICD-10-CM

## 2017-09-03 NOTE — Progress Notes (Signed)
Carelink Summary Report / Loop Recorder 

## 2017-09-05 ENCOUNTER — Other Ambulatory Visit: Payer: Self-pay | Admitting: Physician Assistant

## 2017-09-06 ENCOUNTER — Other Ambulatory Visit: Payer: Self-pay

## 2017-09-06 MED ORDER — CARVEDILOL 25 MG PO TABS: 25.0000 mg | ORAL_TABLET | Freq: Two times a day (BID) | ORAL | 3 refills | Status: AC

## 2017-10-07 ENCOUNTER — Ambulatory Visit (INDEPENDENT_AMBULATORY_CARE_PROVIDER_SITE_OTHER): Payer: Medicare Other | Admitting: *Deleted

## 2017-10-07 DIAGNOSIS — I480 Paroxysmal atrial fibrillation: Secondary | ICD-10-CM

## 2017-10-07 LAB — CUP PACEART REMOTE DEVICE CHECK
Implantable Pulse Generator Implant Date: 20170907
MDC IDC SESS DTM: 20190304231022

## 2017-10-08 NOTE — Progress Notes (Signed)
Carelink Summary Report / Loop Recorder 

## 2017-10-09 ENCOUNTER — Emergency Department (HOSPITAL_COMMUNITY)
Admission: EM | Admit: 2017-10-09 | Discharge: 2017-10-10 | Disposition: A | Discharge status: Home / Self Care | Payer: Medicare Other | Attending: Emergency Medicine | Admitting: Emergency Medicine

## 2017-10-09 ENCOUNTER — Other Ambulatory Visit: Payer: Self-pay

## 2017-10-09 ENCOUNTER — Encounter (HOSPITAL_COMMUNITY): Payer: Self-pay | Admitting: *Deleted

## 2017-10-09 DIAGNOSIS — J45909 Unspecified asthma, uncomplicated: Secondary | ICD-10-CM | POA: Insufficient documentation

## 2017-10-09 DIAGNOSIS — N189 Chronic kidney disease, unspecified: Secondary | ICD-10-CM | POA: Insufficient documentation

## 2017-10-09 DIAGNOSIS — Z87891 Personal history of nicotine dependence: Secondary | ICD-10-CM | POA: Insufficient documentation

## 2017-10-09 DIAGNOSIS — Z853 Personal history of malignant neoplasm of breast: Secondary | ICD-10-CM | POA: Diagnosis not present

## 2017-10-09 DIAGNOSIS — E1122 Type 2 diabetes mellitus with diabetic chronic kidney disease: Secondary | ICD-10-CM | POA: Diagnosis not present

## 2017-10-09 DIAGNOSIS — Z79899 Other long term (current) drug therapy: Secondary | ICD-10-CM | POA: Diagnosis not present

## 2017-10-09 DIAGNOSIS — Z7984 Long term (current) use of oral hypoglycemic drugs: Secondary | ICD-10-CM | POA: Insufficient documentation

## 2017-10-09 DIAGNOSIS — Z95810 Presence of automatic (implantable) cardiac defibrillator: Secondary | ICD-10-CM | POA: Insufficient documentation

## 2017-10-09 DIAGNOSIS — R55 Syncope and collapse: Secondary | ICD-10-CM | POA: Insufficient documentation

## 2017-10-09 DIAGNOSIS — F329 Major depressive disorder, single episode, unspecified: Secondary | ICD-10-CM | POA: Diagnosis not present

## 2017-10-09 DIAGNOSIS — I129 Hypertensive chronic kidney disease with stage 1 through stage 4 chronic kidney disease, or unspecified chronic kidney disease: Secondary | ICD-10-CM | POA: Diagnosis not present

## 2017-10-09 LAB — CBC
HEMATOCRIT: 40.1 % (ref 36.0–46.0)
Hemoglobin: 12.8 g/dL (ref 12.0–15.0)
MCH: 27.3 pg (ref 26.0–34.0)
MCHC: 31.9 g/dL (ref 30.0–36.0)
MCV: 85.5 fL (ref 78.0–100.0)
Platelets: 244 10*3/uL (ref 150–400)
RBC: 4.69 MIL/uL (ref 3.87–5.11)
RDW: 14.2 % (ref 11.5–15.5)
WBC: 6.2 10*3/uL (ref 4.0–10.5)

## 2017-10-09 LAB — URINALYSIS, ROUTINE W REFLEX MICROSCOPIC
Bilirubin Urine: NEGATIVE
Glucose, UA: NEGATIVE mg/dL
Hgb urine dipstick: NEGATIVE
Ketones, ur: NEGATIVE mg/dL
Nitrite: NEGATIVE
PROTEIN: NEGATIVE mg/dL
Specific Gravity, Urine: 1.006 (ref 1.005–1.030)
pH: 5 (ref 5.0–8.0)

## 2017-10-09 LAB — TROPONIN I

## 2017-10-09 LAB — BASIC METABOLIC PANEL
ANION GAP: 13 (ref 5–15)
BUN: 12 mg/dL (ref 6–20)
CHLORIDE: 102 mmol/L (ref 101–111)
CO2: 21 mmol/L — AB (ref 22–32)
Calcium: 9.9 mg/dL (ref 8.9–10.3)
Creatinine, Ser: 0.93 mg/dL (ref 0.44–1.00)
GFR calc non Af Amer: >60 mL/min (ref >60)
Glucose, Bld: 119 mg/dL — ABNORMAL HIGH (ref 65–99)
POTASSIUM: 4.2 mmol/L (ref 3.5–5.1)
Sodium: 136 mmol/L (ref 135–145)

## 2017-10-09 MED ORDER — SODIUM CHLORIDE 0.9 % IV BOLUS
500.0000 mL | Freq: Once | INTRAVENOUS | Status: AC
  Administered 2017-10-09: 500 mL via INTRAVENOUS

## 2017-10-09 MED ORDER — HYDROCODONE-ACETAMINOPHEN 5-325 MG PO TABS
1.0000 | ORAL_TABLET | Freq: Once | ORAL | Status: AC
  Administered 2017-10-09: 1 via ORAL
  Filled 2017-10-09: qty 1

## 2017-10-09 MED ORDER — CARVEDILOL 12.5 MG PO TABS
25.0000 mg | ORAL_TABLET | Freq: Two times a day (BID) | ORAL | Status: DC
  Filled 2017-10-09: qty 2

## 2017-10-09 NOTE — ED Provider Notes (Signed)
[facility] EMERGENCY DEPARTMENT Provider Note   CSN: [id number] Arrival date & time: 10/09/17  1836     History   Chief Complaint Chief Complaint  Patient presents with  . Near Syncope    HPI Katie Owens is a 67 y.o. female.  Patient with history of hypertension presenting with episode of near syncope and lightheadedness.  States this happened about an hour after she ate lunch.  She states she had 2 glasses of iced tea which is abnormal for her and this made her blood pressure go up.  It was associated with headache, blurry vision, lightheadedness.  She states she also ate shrimp which she reports she is allergic to and normally causes hives.  No difficulty breathing or difficulty swallowing.  No chest pain or shortness of breath.  Has had her blood pressure medications other than tonight.  States her blood pressure normally is well controlled.  History of a A. fib status post ablation with loop recorder in place.  Does not take any blood thinners.  She feels almost back to baseline now.  Denies any focal weakness, numbness or tingling.  No difficulty speaking or difficulty swallowing.  Denies any spinning sensation or visual change.  The history is provided by the patient.  Near Syncope  Pertinent negatives include no chest pain, no abdominal pain and no shortness of breath.    Past Medical History:  Diagnosis Date  . Arthritis   . Asthma   . Cancer [facility]) 1995   breast cancer lt with 12 nodes out  . Chronic kidney disease    hx stones  . Contact lens/glasses fitting    wears contacts or glasses  . Depression   . Diabetes mellitus without complication ([place])   . Dysrhythmia    hx AF  . Fibromyalgia   . GERD (gastroesophageal reflux disease)   . Hyperlipemia   . Hypertension   . IBS (irritable bowel syndrome)   . PONV (postoperative nausea and vomiting)   . Seasonal allergies   . Shortness of breath   . Sleep apnea    uses a cpap    Patient  Active Problem List   Diagnosis Date Noted  . Hyperlipidemia LDL goal <70 08/25/2014  . Obstructive sleep apnea 08/25/2014  . Morbid obesity ([place]) 08/25/2014  . HYPERLIPIDEMIA 09/20/2009  . Essential hypertension 09/20/2009  . ATRIAL FIBRILLATION, PAROXYSMAL 09/20/2009  . GERD 09/20/2009    Past Surgical History:  Procedure Laterality Date  . BREAST LUMPECTOMY WITH AXILLARY LYMPH NODE DISSECTION  1995   12 nodes removed  . CARDIAC CATHETERIZATION  01/24/2009   left heart  . CARDIAC ELECTROPHYSIOLOGY [place] AND ABLATION  2012  . CARPAL TUNNEL RELEASE Right 10/14/2012   Procedure: CARPAL TUNNEL RELEASE;  Surgeon: Wynonia Sours, MD;  Location: [place];  Service: Orthopedics;  Laterality: Right;  . COLONOSCOPY    . DIAGNOSTIC LAPAROSCOPY  1990  . EP IMPLANTABLE DEVICE N/A 03/08/2016   Procedure: Loop Recorder Insertion;  Surgeon: Thompson Grayer, MD;  Location: [place] [facility];  Service: Cardiovascular;  Laterality: N/A;  . LUMBAR FUSION    . TONSILLECTOMY       OB History   None      Home Medications    Prior to Admission medications   Medication Sig Start Date End Date Taking? Authorizing Provider  ALPRAZolam Duanne Moron) 0.5 MG tablet Take 0.5 mg by mouth at bedtime as needed for sleep.  08/06/14   [provider]  Ascorbic Acid (VITAMIN C) 1000 MG tablet Take 2,000 mg by mouth daily.     [provider]  beclomethasone (QVAR) 80 MCG/ACT inhaler Inhale 1 puff into the lungs 2 (two) times daily.     [provider]  BIOTIN 5000 PO Take 1 tablet by mouth daily.    [provider]  carvedilol (COREG) 25 MG tablet Take 1 tablet (25 mg total) by mouth 2 (two) times daily. 09/06/17   Baldwin Jamaica, PA-C  chlorthalidone (HYGROTON) 50 MG tablet Take 50 mg by mouth every morning. 12/24/14   [provider]  cholecalciferol (VITAMIN D) 1000 UNITS tablet Take 2,000 Units by mouth daily.     [provider]  co-enzyme Q-10  30 MG capsule Take 30 mg by mouth daily.     [provider]  cyclobenzaprine (FLEXERIL) 10 MG tablet Take 10 mg by mouth at bedtime.    [provider]  diltiazem (CARDIZEM) 30 MG tablet Take 1 tablet by mouth every 4 hours AS NEEDED for HR>100 and BP>100    [provider]  diphenhydrAMINE (BENADRYL) 25 MG tablet Take 25 mg by mouth every 6 (six) hours as needed for allergies.    [provider]  DULoxetine (CYMBALTA) 60 MG capsule Take 1 capsule by mouth daily. 02/09/16   [provider]  etodolac (LODINE) 400 MG tablet 400 mg 2 (two) times daily.  07/24/14   [provider]  HYDROcodone-acetaminophen (NORCO) 5-325 MG per tablet Take 1 tablet by mouth every 6 (six) hours as needed for pain. 10/14/12   Daryll Brod, MD  loperamide (IMODIUM) 2 MG capsule Take 2 mg by mouth every morning.     [provider]  losartan (COZAAR) 50 MG tablet Take 1 tablet (50 mg total) by mouth daily. 04/22/17   Baldwin Jamaica, PA-C  metFORMIN (GLUCOPHAGE-XR) 500 MG 24 hr tablet Take 2 tablets by mouth 2 (two) times daily.  02/11/16   [provider]  Multiple Vitamins-Minerals (MULTIVITAMIN ADULT PO) Take 1 tablet by mouth daily.    [provider]  omeprazole (PRILOSEC) 20 MG capsule Take 20 mg by mouth daily.    [provider]  potassium chloride SA (K-DUR,KLOR-CON) 20 MEQ tablet Take 20 mEq by mouth daily.     [provider]  pregabalin (LYRICA) 75 MG capsule Take 75 mg by mouth 2 (two) times daily.    [provider]  pyridOXINE (VITAMIN B-6) 100 MG tablet Take 100 mg by mouth daily.    [provider]  rosuvastatin (CRESTOR) 10 MG tablet Take 10 mg by mouth daily.    [provider]  TRADJENTA 5 MG TABS tablet Take 5 mg by mouth every morning. 08/18/14   [provider]  VENTOLIN HFA 108 (90 BASE) MCG/ACT inhaler Inhale 1-2 puffs into the lungs as directed. As needed for shortness  of breath or wheezing 12/24/14   [provider]  vitamin B-12 (CYANOCOBALAMIN) 1000 MCG tablet Take 5,000 mcg by mouth daily.     [provider]    Family History Family History  Problem Relation Age of Onset  . Stroke Mother   . Hypertension Mother   . Alcoholism Mother   . Alcoholism Father   . Alcoholism Brother   . Hypertension Brother   . Hyperlipidemia Brother   . Anemia Brother   . Other Brother        bleeding hemorroids  . Heart disease  Brother        valve surgery  . Heart attack Maternal Grandmother        heart valve surgery  . Hyperlipidemia Maternal Grandmother   . Hypertension Maternal Grandmother     Social History Social History   Tobacco Use  . Smoking status: Former Smoker    Packs/day: 2.00    Years: 15.00    Pack years: 30.00    Types: Cigarettes    Last attempt to quit: 10/08/1979    Years since quitting: 38.0  . Smokeless tobacco: Never Used  Substance Use Topics  . Alcohol use: No    Alcohol/week: 0.0 oz  . Drug use: No     Allergies   Penicillins; Aspirin; and Ibuprofen   Review of Systems Review of Systems  Constitutional: Negative for activity change, appetite change, fatigue and fever.  HENT: Negative for congestion and rhinorrhea.   Respiratory: Negative for cough, chest tightness and shortness of breath.   Cardiovascular: Positive for near-syncope. Negative for chest pain, palpitations and leg swelling.  Gastrointestinal: Positive for nausea. Negative for abdominal pain and vomiting.  Genitourinary: Negative for dysuria, hematuria, vaginal bleeding and vaginal discharge.  Musculoskeletal: Negative for arthralgias and myalgias.  Neurological: Positive for dizziness and light-headedness. Negative for syncope, speech difficulty and numbness.    all other systems are negative except as noted in the HPI and PMH.    Physical Exam Updated Vital Signs BP (!) 164/94   Pulse (!) 101   Temp 99.8 F (37.7 C) (Oral)    Resp (!) 24   Ht 4\' 11"  (1.499 m)   Wt 106.1 kg (234 lb)   SpO2 96%   BMI 47.26 kg/m   Physical Exam  Constitutional: She is oriented to person, place, and time. She appears well-developed and well-nourished. No distress.  HENT:  Head: Normocephalic and atraumatic.  Mouth/Throat: Oropharynx is clear and moist. No oropharyngeal exudate.  Eyes: Pupils are equal, round, and reactive to light. Conjunctivae and EOM are normal.  Neck: Normal range of motion. Neck supple.  No meningismus.  Cardiovascular: Normal rate, regular rhythm, normal heart sounds and intact distal pulses.  No murmur heard. Pulmonary/Chest: Effort normal and breath sounds normal. No respiratory distress.  Abdominal: Soft. There is no tenderness. There is no rebound and no guarding.  Musculoskeletal: Normal range of motion. She exhibits no edema or tenderness.  Neurological: She is alert and oriented to person, place, and time. No cranial nerve deficit. She exhibits normal muscle tone. Coordination normal.  CN 2-12 intact, no ataxia on finger to nose, no nystagmus, 5/5 strength throughout, no pronator drift, Romberg negative, normal gait.   Skin: Skin is warm. Capillary refill takes less than 2 seconds. No rash noted.  Psychiatric: She has a normal mood and affect. Her behavior is normal.  Nursing note and vitals reviewed.    ED Treatments / Results  Labs (all labs ordered are listed, but only abnormal results are displayed) Labs Reviewed  BASIC METABOLIC PANEL - Abnormal; Notable for the following components:      Result Value   CO2 21 (*)    Glucose, Bld 119 (*)    All other components within normal limits  URINALYSIS, ROUTINE W REFLEX MICROSCOPIC - Abnormal; Notable for the following components:   Color, Urine STRAW (*)    Leukocytes, UA TRACE (*)    Bacteria, UA RARE (*)    Squamous Epithelial / LPF 0-5 (*)    All other components within normal  limits  CBC  TROPONIN I  TROPONIN I    EKG EKG  Interpretation  Date/Time:  Wednesday October 09 2017 19:37:01 EDT Ventricular Rate:  97 PR Interval:  162 QRS Duration: 80 QT Interval:  358 QTC Calculation: 454 R Axis:   6 Text Interpretation:  Normal sinus rhythm Anterior infarct , age undetermined Abnormal ECG wandering baseline No significant change was found Confirmed by Ezequiel Essex [phone]) on 10/09/2017 10:47:23 PM   Radiology Ct Head Wo Contrast  Result Date: 10/10/2017 CLINICAL DATA:  Vertigo.  Elevated blood pressure and dizziness. EXAM: CT HEAD WITHOUT CONTRAST TECHNIQUE: Contiguous axial images were obtained from the base of the skull through the vertex without intravenous contrast. COMPARISON:  Brain MRI 07/29/2017 FINDINGS: Brain: Brain volume is normal for age. No intracranial hemorrhage, mass effect, or midline shift. No hydrocephalus. The basilar cisterns are patent. No evidence of territorial infarct or acute ischemia. No extra-axial or intracranial fluid collection. Vascular: No hyperdense vessel or unexpected calcification. Skull: No fracture or focal lesion. Sinuses/Orbits: Paranasal sinuses and mastoid air cells are clear. The visualized orbits are unremarkable. Other: None. IMPRESSION: Unremarkable noncontrast head CT. Electronically Signed   By: Jeb Levering M.D.   On: 10/10/2017 02:18    Procedures Procedures (including critical care time)  Medications Ordered in ED Medications  carvedilol (COREG) tablet 25 mg (has no administration in time range)  sodium chloride 0.9 % bolus 500 mL (500 mLs Intravenous New Bag/Given 10/09/17 2340)  HYDROcodone-acetaminophen (NORCO/VICODIN) 5-325 MG per tablet 1 tablet (1 tablet Oral Given 10/09/17 2327)     Initial Impression / Assessment and Plan / ED Course  I have reviewed the triage vital signs and the nursing notes.  Pertinent labs & imaging results that were available during my care of the patient were reviewed by me and considered in my medical decision making  (see chart for details).    Episode of near syncope today after having caffeine and shrimp for lunch which she normally does not.  No chest pain or shortness of breath.  Feeling improved at this time without focal neurological deficit.  No vertigo.  No ataxia.  Normal gait and negative Romberg.  EKG is unchanged with sinus rhythm.  Orthostatics are positive by heart rate and small fluid bolus is given.  CT head is negative.  Troponin is negative x2.  Blood pressure is elevated but not in a dangerous range.  Patient given p.o. blood pressure medications.  Low suspicion for ACS.  Patient denies any further dizziness or lightheadedness and feels back to baseline.  No chest pain or shortness of breath.  She is tolerating p.o. and ambulatory.  Low suspicion of cardiac arrhythmia.  Blood pressure has improved with home medications.  Instructed to follow-up with PCP.  Return precautions discussed.  Final Clinical Impressions(s) / ED Diagnoses   Final diagnoses:  Near syncope    ED Discharge Orders    None       Iliya Spivack, Annie Main, MD 10/10/17 [phone]

## 2017-10-09 NOTE — ED Triage Notes (Signed)
Pt has hx of htn and has been treated for the past 10 years with recent medication changes in the past 3 months. Reports having an episode of feeling faint while at home today. Has had these episodes 3 other times since having a cardiac ablation 5 years. No neuro deficits noted in triage.

## 2017-10-10 ENCOUNTER — Emergency Department (HOSPITAL_COMMUNITY): Payer: Medicare Other

## 2017-10-10 LAB — TROPONIN I: Troponin I: <0.03 ng/mL (ref <0.03)

## 2017-10-10 MED ORDER — CARVEDILOL 12.5 MG PO TABS
25.0000 mg | ORAL_TABLET | ORAL | Status: AC
  Administered 2017-10-10: 25 mg via ORAL

## 2017-10-10 NOTE — ED Notes (Addendum)
Please see downtime record. Pt Discharged during downtime

## 2017-10-10 NOTE — ED Notes (Signed)
Pt ambulatory to the bathroom without assistance at a steady gait.

## 2017-10-10 NOTE — ED Notes (Signed)
Water given to pt for PO challenge. 

## 2017-10-10 NOTE — Discharge Instructions (Addendum)
Keep yourself hydrated.  Follow-up with your doctor.  Monitor your blood  pressure carefully take your medications as prescribed.  Return to the ED if you develop chest pain, shortness of breath or any other concerns.

## 2017-10-15 ENCOUNTER — Other Ambulatory Visit: Payer: Self-pay | Admitting: Neurological Surgery

## 2017-10-15 DIAGNOSIS — M5416 Radiculopathy, lumbar region: Secondary | ICD-10-CM

## 2017-10-24 ENCOUNTER — Ambulatory Visit
Admission: RE | Admit: 2017-10-24 | Discharge: 2017-10-24 | Disposition: A | Discharge status: Home / Self Care | Payer: Medicare Other | Source: Ambulatory Visit | Attending: Neurological Surgery | Admitting: Neurological Surgery

## 2017-10-24 DIAGNOSIS — M5416 Radiculopathy, lumbar region: Secondary | ICD-10-CM

## 2017-10-24 MED ORDER — GADOBENATE DIMEGLUMINE 529 MG/ML IV SOLN
20.0000 mL | Freq: Once | INTRAVENOUS | Status: AC | PRN
  Administered 2017-10-24: 20 mL via INTRAVENOUS

## 2017-11-07 ENCOUNTER — Ambulatory Visit (INDEPENDENT_AMBULATORY_CARE_PROVIDER_SITE_OTHER): Payer: Medicare Other | Admitting: *Deleted

## 2017-11-07 DIAGNOSIS — I480 Paroxysmal atrial fibrillation: Secondary | ICD-10-CM

## 2017-11-07 LAB — CUP PACEART REMOTE DEVICE CHECK
Date Time Interrogation Session: 20190406234119
MDC IDC PG IMPLANT DT: 20170907

## 2017-11-11 NOTE — Progress Notes (Signed)
Carelink Summary Report / Loop Recorder 

## 2017-11-20 NOTE — Progress Notes (Signed)
Cardiology Office Note Date:  11/21/2017  Patient ID:  Katie Owens, Katie Owens 1950/07/09, MRN [id number] PCP:  Katie Late, MD  Electrophysiologist:  Dr. Rayann Owens   Chief Complaint: planned f/u visit  History of Present Illness: AVEREY Owens is a 67 y.o. female with history of AFib s/p afib ablation at [facility] by Dr Katie Owens in 2012, DM, fibromyalgia, HTN, HLD, OSA w/CPAP.   She comes today to be seen for Dr. Rayann Owens, last seen by him in April.  At that time discussed her symptoms of palpitations prompted ILR implant, to date of that visit, palpitations were attributed to PVCs, a brief ATach had been observed, no AFib.  The patient opted to stop Xarelto at that time given no AFib.  Understanding that if AF were to be noted by her ILR would need to resume.  A subsequent telephone note a pre-syncopal spell with brief ATach  I saw her in Oct 2018, she was feeling fairly well, her chronic back pain and fibromyalgia were limiting her, no CP, no syncope or SOB.  She reported a couple events (3) in the prior month where she feels suddenly weak/fleetingly her vision starts to get tunneled and then stops she says with a very strong heart beat, these last no more then 4-6 seconds.  We discussed life style strategies such as weight loss and exercise.  She was limited with significant CBP/neck pain, we discussed chair exercises or other safe options for exercise, as well as the importance of adequate hydration and avoidance of stimulants.  She reported compliance with her CPAP, no ETOH or smoking, and having had "full labs" done 2 weeks prior with her PMD, all were reported as OK.  Her ILR noted symptom episodes of breif AT, one 4 beat NSVT, her coreg was up-titrated.  She was seen in the ER in April for a near syncopal event, no clear etiology was noted, symptoms resolved and discharged.  I don't see if her ILR was interrogated.  She states she had drank more then usual Ice Tea and thinks the extra caffeine,  shrimp in the food she had made her BP high and feel poorly, her PMD resumed her chlorthalidone and mentions he changed her other BP medicine to Irbesartan as well.  Yesterday she felt like her heart rate was faster then usual, maybe slightly lightheaded, she was seated, and self resolved quickly.   She denies orthostatic symptoms.  She has not felt like she hash had any AFib.  This being a very specific and different symptoms then her "faster rates".  No CP or SOB.  No syncope.    Device information: MDT ILR implanted 03/08/16, Dr. Rayann Owens  Past Medical History:  Diagnosis Date  . Arthritis   . Asthma   . Cancer [facility]) 1995   breast cancer lt with 12 nodes out  . Chronic kidney disease    hx stones  . Contact lens/glasses fitting    wears contacts or glasses  . Depression   . Diabetes mellitus without complication ([place])   . Dysrhythmia    hx AF  . Fibromyalgia   . GERD (gastroesophageal reflux disease)   . Hyperlipemia   . Hypertension   . IBS (irritable bowel syndrome)   . PONV (postoperative nausea and vomiting)   . Seasonal allergies   . Shortness of breath   . Sleep apnea    uses a cpap    Past Surgical History:  Procedure Laterality Date  . BREAST LUMPECTOMY WITH  AXILLARY LYMPH NODE DISSECTION  1995   12 nodes removed  . CARDIAC CATHETERIZATION  01/24/2009   left heart  . CARDIAC ELECTROPHYSIOLOGY [place] AND ABLATION  2012  . CARPAL TUNNEL RELEASE Right 10/14/2012   Procedure: CARPAL TUNNEL RELEASE;  Surgeon: Wynonia Sours, MD;  Location: [place];  Service: Orthopedics;  Laterality: Right;  . COLONOSCOPY    . DIAGNOSTIC LAPAROSCOPY  1990  . EP IMPLANTABLE DEVICE N/A 03/08/2016   Procedure: Loop Recorder Insertion;  Surgeon: Katie Grayer, MD;  Location: [place] [facility];  Service: Cardiovascular;  Laterality: N/A;  . LUMBAR FUSION    . TONSILLECTOMY      Current Outpatient Medications  Medication Sig Dispense Refill  . ALPRAZolam (XANAX) 0.5 MG  tablet Take 0.5 mg by mouth at bedtime as needed for sleep.   1  . Ascorbic Acid (VITAMIN C) 1000 MG tablet Take 2,000 mg by mouth daily.     . beclomethasone (QVAR) 80 MCG/ACT inhaler Inhale 1 puff into the lungs 2 (two) times daily.     . carvedilol (COREG) 25 MG tablet Take 1 tablet (25 mg total) by mouth 2 (two) times daily. 180 tablet 3  . cholecalciferol (VITAMIN D) 1000 UNITS tablet Take 2,000 Units by mouth daily.     [name] co-enzyme Q-10 30 MG capsule Take 30 mg by mouth daily.     . cyclobenzaprine (FLEXERIL) 10 MG tablet Take 10 mg by mouth at bedtime.    [name] diltiazem (CARDIZEM) 30 MG tablet Take 1 tablet by mouth every 4 hours AS NEEDED for HR>100 and BP>100    . diphenhydrAMINE (BENADRYL) 25 MG tablet Take 25 mg by mouth every 6 (six) hours as needed for allergies.    . DULoxetine (CYMBALTA) 60 MG capsule Take 1 capsule by mouth daily.    [name] etodolac (LODINE) 400 MG tablet Take 400 mg by mouth 2 (two) times daily.   7  . HYDROcodone-acetaminophen (NORCO) 5-325 MG per tablet Take 1 tablet by mouth every 6 (six) hours as needed for pain. 30 tablet 0  . lidocaine (LIDODERM) 5 % Place 1 patch onto the skin daily as needed (pain). Remove & Discard patch within 12 hours or as directed by MD    . loperamide (IMODIUM) 2 MG capsule Take 2 mg by mouth every morning.     . metFORMIN (GLUCOPHAGE-XR) 500 MG 24 hr tablet Take 2 tablets by mouth 2 (two) times daily.     . Multiple Vitamins-Minerals (MULTIVITAMIN ADULT PO) Take 1 tablet by mouth daily.    [name] omeprazole (PRILOSEC) 20 MG capsule Take 20 mg by mouth daily.    . potassium chloride SA (K-DUR,KLOR-CON) 20 MEQ tablet Take 20 mEq by mouth daily.     . pregabalin (LYRICA) 75 MG capsule Take 75 mg by mouth 2 (two) times daily.    . rosuvastatin (CRESTOR) 10 MG tablet Take 10 mg by mouth daily.    . TRADJENTA 5 MG TABS tablet Take 5 mg by mouth every morning.  0  . VENTOLIN HFA 108 (90 BASE) MCG/ACT inhaler Inhale 1-2 puffs into the lungs every 6  (six) hours as needed for wheezing or shortness of breath.   3  . vitamin B-12 (CYANOCOBALAMIN) 1000 MCG tablet Take 5,000 mcg by mouth daily.      No current facility-administered medications for this visit.     Allergies:   Penicillins; Aspirin; and Ibuprofen   Social History:  The patient  reports  that she quit smoking about 38 years ago. Her smoking use included cigarettes. She has a 30.00 pack-year smoking history. She has never used smokeless tobacco. She reports that she does not drink alcohol or use drugs.   Family History:  The patient's family history includes Alcoholism in her brother, father, and mother; Anemia in her brother; Heart attack in her maternal grandmother; Heart disease in her brother; Hyperlipidemia in her brother and maternal grandmother; Hypertension in her brother, maternal grandmother, and mother; Other in her brother; Stroke in her mother.  ROS:  Please see the history of present illness.  All other systems are reviewed and otherwise negative.   PHYSICAL EXAM:  VS:  BP 108/60   Pulse 88   Ht 4\' 11"  (1.499 m)   Wt 225 lb (102.1 kg)   SpO2 98%   BMI 45.44 kg/m  BMI: Body mass index is 45.44 kg/m. Well nourished, well developed, in no acute distress  HEENT: normocephalic, atraumatic  Neck: no JVD, carotid bruits or masses Cardiac: RRR; no significant murmurs, no rubs, or gallops Lungs:  CTA b/l, no wheezing, rhonchi or rales  Abd: soft, non-tender, obese MS: no deformity or atrophy Ext:  no edema  Skin: warm and dry, no rash Neuro:  No gross deficits appreciated Psych: euthymic mood, full affect  ILR site is stable, no tethering or discomfort   EKG:  Not done today ILR interrogation done today and reviewed by myself: battery is good, one false AF event from Oct 2018, no other episodes    09/11/10: Cardiac CT  Non-Cardiac Findings: Vascular calcification in the left breast. Apparent asymmetrically increasedglandular tissue in the left breast  may be artifactual since a large lateral portion of the right breast is not included in the field of view. Mammography is suggested if the patient has not had a recent mammogram. Impressions: - Pulmonary vein anatomy as noted above - Mildly dilated left atrium - Upper normal to mIldly dilated pulmonary arteries - Breast findings as noted above   01/24/09: LHC Normal coronaries Normal LVEF No significant AS or MR  Recent Labs: 10/09/2017: BUN 12; Creatinine, Ser 0.93; Hemoglobin 12.8; Platelets 244; Potassium 4.2; Sodium 136  No results found for requested labs within last 8760 hours.   CrCl cannot be calculated (Patient's most recent lab result is older than the maximum 21 days allowed.).   Wt Readings from Last 3 Encounters:  11/21/17 225 lb (102.1 kg)  10/09/17 234 lb (106.1 kg)  04/16/17 230 lb (104.3 kg)     Other studies reviewed: Additional studies/records reviewed today include: summarized above  ASSESSMENT AND PLAN:  1. AFib s/p AF ablation 2012     CHA2DS2Vasc is at least 4, off a/c post ablation w/o recurrent AF     Continue to monitor via ILR     No AF to date by ILR  2. HTN     Looks OK, she is reminded to maintain adequate hydration with the addition of her chlorthalidone  3. ATach     brief AT episodes associated with her symptom episodes, one 4 beat NSVT     Her symptoms are less frequent and shorter then her AF symptoms ever were     She is very happy with her current regime   Disposition: continue monthly loop transmissions, we will see her back in-clinic in 1 year, sooner if needed.  Current medicines are reviewed at length with the patient today.  The patient did not have any concerns regarding  medicines.  Venetia Night, PA-C 11/21/2017 1:02 PM     [place] [address] [phone] (office)  [phone] (fax)

## 2017-11-21 ENCOUNTER — Encounter: Payer: Self-pay | Admitting: Physician Assistant

## 2017-11-21 ENCOUNTER — Ambulatory Visit: Payer: Medicare Other | Admitting: Physician Assistant

## 2017-11-21 VITALS — BP 108/60 | HR 88 | Ht 59.0 in | Wt 225.0 lb

## 2017-11-21 DIAGNOSIS — R002 Palpitations: Secondary | ICD-10-CM | POA: Diagnosis not present

## 2017-11-21 DIAGNOSIS — I4891 Unspecified atrial fibrillation: Secondary | ICD-10-CM

## 2017-11-21 DIAGNOSIS — I10 Essential (primary) hypertension: Secondary | ICD-10-CM

## 2017-11-21 NOTE — Patient Instructions (Addendum)
Medication Instructions:   Your physician recommends that you continue on your current medications as directed. Please refer to the Current Medication list given to you today.   If you need a refill on your cardiac medications before your next appointment, please call your pharmacy.  Labwork:  NONE ORDERED  TODAY    Testing/Procedures: NONE ORDERED  TODAY   Follow-Up:  Your physician wants you to follow-up in: [place] will receive a reminder letter in the mail two months in advance. If you don't receive a letter, please call our office to schedule the follow-up appointment.   Remote monitoring is used to monitor your Pacemaker of ICD from home. This monitoring reduces the number of office visits required to check your device to one time per year. It allows [place] to keep an eye on the functioning of your device to ensure it is working properly. You are scheduled for a device check from home on .  12-10-17..You may send your transmission at any time that day. If you have a wireless device, the transmission will be sent automatically. After your physician reviews your transmission, you will receive a postcard with your next transmission date.     Any Other Special Instructions Will Be Listed Below (If Applicable).

## 2017-12-02 LAB — CUP PACEART REMOTE DEVICE CHECK
Date Time Interrogation Session: 20190510000928
MDC IDC PG IMPLANT DT: 20170907

## 2017-12-10 ENCOUNTER — Ambulatory Visit (INDEPENDENT_AMBULATORY_CARE_PROVIDER_SITE_OTHER): Payer: Medicare Other | Admitting: *Deleted

## 2017-12-10 DIAGNOSIS — I4891 Unspecified atrial fibrillation: Secondary | ICD-10-CM | POA: Diagnosis not present

## 2017-12-11 NOTE — Progress Notes (Signed)
Carelink Summary Report / Loop Recorder 

## 2018-01-13 ENCOUNTER — Ambulatory Visit (INDEPENDENT_AMBULATORY_CARE_PROVIDER_SITE_OTHER): Payer: Medicare Other | Admitting: *Deleted

## 2018-01-13 DIAGNOSIS — R002 Palpitations: Secondary | ICD-10-CM | POA: Diagnosis not present

## 2018-01-13 DIAGNOSIS — I4891 Unspecified atrial fibrillation: Secondary | ICD-10-CM

## 2018-01-13 NOTE — Progress Notes (Signed)
Carelink Summary Report / Loop Recorder 

## 2018-01-16 LAB — CUP PACEART REMOTE DEVICE CHECK
Date Time Interrogation Session: 20190612003542
Implantable Pulse Generator Implant Date: 20170907

## 2018-01-22 ENCOUNTER — Telehealth: Payer: Self-pay | Admitting: *Deleted

## 2018-01-22 NOTE — Telephone Encounter (Addendum)
Spoke with patient to schedule Device Clinic appointment for ILR reprogramming.  She is agreeable to an appointment on 01/30/18 at 2:30pm.  Patient had near-syncopal event in 09/2017 per ED notes.  Plan to turn ILR pause detection on at 4.5sec, brady detection on at 12bts.  Patient does have a symptom activator (for history of palpitations/AF) per notes.

## 2018-01-30 ENCOUNTER — Ambulatory Visit (INDEPENDENT_AMBULATORY_CARE_PROVIDER_SITE_OTHER): Payer: Medicare Other | Admitting: *Deleted

## 2018-01-30 DIAGNOSIS — R002 Palpitations: Secondary | ICD-10-CM

## 2018-01-30 LAB — CUP PACEART INCLINIC DEVICE CHECK
Implantable Pulse Generator Implant Date: 20170907
MDC IDC SESS DTM: 20190801145027

## 2018-01-30 NOTE — Progress Notes (Signed)
Loop check in clinic. Battery status: Good. R-waves 0.16mV. 0 symptom episodes, 0 tachy episodes, 0 pause episodes, 0 brady episodes. 0 AF episodes (0% burden). Per telephone note 01/22/18 patient had near-syncopal event in 09/2017-- Reprogrammed brady detection ON, brady duration to 12 beats, pause detection ON, Pause duration to 4.5 seconds. Monthly summary reports and ROV with JA 10/2018.

## 2018-02-14 ENCOUNTER — Ambulatory Visit (INDEPENDENT_AMBULATORY_CARE_PROVIDER_SITE_OTHER): Payer: Medicare Other | Admitting: *Deleted

## 2018-02-14 DIAGNOSIS — R002 Palpitations: Secondary | ICD-10-CM | POA: Diagnosis not present

## 2018-02-14 DIAGNOSIS — I4891 Unspecified atrial fibrillation: Secondary | ICD-10-CM

## 2018-02-17 NOTE — Progress Notes (Signed)
Carelink Summary Report / Loop Recorder 

## 2018-02-25 LAB — CUP PACEART REMOTE DEVICE CHECK
Date Time Interrogation Session: 20190715003518
Implantable Pulse Generator Implant Date: 20170907

## 2018-03-19 ENCOUNTER — Ambulatory Visit (INDEPENDENT_AMBULATORY_CARE_PROVIDER_SITE_OTHER): Payer: Medicare Other | Admitting: *Deleted

## 2018-03-19 DIAGNOSIS — I4891 Unspecified atrial fibrillation: Secondary | ICD-10-CM

## 2018-03-20 NOTE — Progress Notes (Signed)
Carelink Summary Report / Loop Recorder 

## 2018-03-24 LAB — CUP PACEART REMOTE DEVICE CHECK
Implantable Pulse Generator Implant Date: 20170907
MDC IDC SESS DTM: 20190817003754

## 2018-03-31 LAB — CUP PACEART REMOTE DEVICE CHECK
MDC IDC PG IMPLANT DT: 20170907
MDC IDC SESS DTM: 20190919013529

## 2018-04-21 ENCOUNTER — Ambulatory Visit (INDEPENDENT_AMBULATORY_CARE_PROVIDER_SITE_OTHER): Payer: Medicare Other | Admitting: *Deleted

## 2018-04-21 DIAGNOSIS — I4891 Unspecified atrial fibrillation: Secondary | ICD-10-CM

## 2018-04-22 NOTE — Progress Notes (Signed)
Carelink Summary Report / Loop Recorder 

## 2018-05-09 LAB — CUP PACEART REMOTE DEVICE CHECK
Implantable Pulse Generator Implant Date: 20170907
MDC IDC SESS DTM: 20191022020937

## 2018-05-26 ENCOUNTER — Ambulatory Visit (INDEPENDENT_AMBULATORY_CARE_PROVIDER_SITE_OTHER): Payer: Medicare Other

## 2018-05-26 DIAGNOSIS — R002 Palpitations: Secondary | ICD-10-CM

## 2018-05-26 DIAGNOSIS — I4891 Unspecified atrial fibrillation: Secondary | ICD-10-CM

## 2018-05-26 NOTE — Progress Notes (Signed)
Carelink Summary Report / Loop Recorder 

## 2018-06-26 ENCOUNTER — Ambulatory Visit: Payer: Medicare Other

## 2018-06-27 LAB — CUP PACEART REMOTE DEVICE CHECK
Implantable Pulse Generator Implant Date: 20170907
MDC IDC SESS DTM: 20191227031016

## 2018-07-13 LAB — CUP PACEART REMOTE DEVICE CHECK
Date Time Interrogation Session: 20191124024138
MDC IDC PG IMPLANT DT: 20170907

## 2018-07-29 ENCOUNTER — Ambulatory Visit (INDEPENDENT_AMBULATORY_CARE_PROVIDER_SITE_OTHER): Payer: Medicare Other

## 2018-07-29 DIAGNOSIS — I4891 Unspecified atrial fibrillation: Secondary | ICD-10-CM

## 2018-07-30 NOTE — Progress Notes (Signed)
Carelink Summary Report / Loop Recorder 

## 2018-07-31 LAB — CUP PACEART REMOTE DEVICE CHECK
Implantable Pulse Generator Implant Date: 20170907
MDC IDC SESS DTM: 20200129030546

## 2018-09-01 ENCOUNTER — Ambulatory Visit (INDEPENDENT_AMBULATORY_CARE_PROVIDER_SITE_OTHER): Payer: Medicare Other | Admitting: *Deleted

## 2018-09-01 DIAGNOSIS — I4891 Unspecified atrial fibrillation: Secondary | ICD-10-CM | POA: Diagnosis not present

## 2018-09-01 DIAGNOSIS — R002 Palpitations: Secondary | ICD-10-CM

## 2018-09-01 LAB — CUP PACEART REMOTE DEVICE CHECK
Date Time Interrogation Session: 20200302071236
MDC IDC PG IMPLANT DT: 20170907

## 2018-09-08 NOTE — Progress Notes (Signed)
Carelink Summary Report / Loop Recorder 

## 2018-10-06 ENCOUNTER — Other Ambulatory Visit: Payer: Self-pay

## 2018-10-06 ENCOUNTER — Ambulatory Visit (INDEPENDENT_AMBULATORY_CARE_PROVIDER_SITE_OTHER): Payer: Medicare Other | Admitting: *Deleted

## 2018-10-06 DIAGNOSIS — I4891 Unspecified atrial fibrillation: Secondary | ICD-10-CM | POA: Diagnosis not present

## 2018-10-06 LAB — CUP PACEART REMOTE DEVICE CHECK
Date Time Interrogation Session: 20200404094256
Implantable Pulse Generator Implant Date: 20170907

## 2018-10-15 NOTE — Progress Notes (Signed)
Carelink Summary Report / Loop Recorder 

## 2018-11-06 ENCOUNTER — Other Ambulatory Visit: Payer: Self-pay

## 2018-11-06 ENCOUNTER — Ambulatory Visit (INDEPENDENT_AMBULATORY_CARE_PROVIDER_SITE_OTHER): Payer: Medicare Other | Admitting: *Deleted

## 2018-11-06 DIAGNOSIS — I4891 Unspecified atrial fibrillation: Secondary | ICD-10-CM | POA: Diagnosis not present

## 2018-11-06 LAB — CUP PACEART REMOTE DEVICE CHECK
Date Time Interrogation Session: 20200507100521
Implantable Pulse Generator Implant Date: 20170907

## 2018-11-11 NOTE — Progress Notes (Signed)
Carelink Summary Report / Loop Recorder 

## 2018-11-26 ENCOUNTER — Other Ambulatory Visit: Payer: Self-pay | Admitting: Family Medicine

## 2018-11-26 DIAGNOSIS — M5416 Radiculopathy, lumbar region: Secondary | ICD-10-CM

## 2018-12-09 ENCOUNTER — Ambulatory Visit (INDEPENDENT_AMBULATORY_CARE_PROVIDER_SITE_OTHER): Payer: Medicare Other | Admitting: *Deleted

## 2018-12-09 DIAGNOSIS — I4891 Unspecified atrial fibrillation: Secondary | ICD-10-CM

## 2018-12-09 DIAGNOSIS — R002 Palpitations: Secondary | ICD-10-CM

## 2018-12-09 LAB — CUP PACEART REMOTE DEVICE CHECK
Date Time Interrogation Session: 20200609120736
Implantable Pulse Generator Implant Date: 20170907

## 2018-12-15 ENCOUNTER — Other Ambulatory Visit: Payer: Medicare Other

## 2018-12-18 NOTE — Progress Notes (Signed)
Carelink Summary Report / Loop Recorder 

## 2019-01-12 ENCOUNTER — Ambulatory Visit (INDEPENDENT_AMBULATORY_CARE_PROVIDER_SITE_OTHER): Payer: Medicare Other | Admitting: *Deleted

## 2019-01-12 DIAGNOSIS — I4891 Unspecified atrial fibrillation: Secondary | ICD-10-CM | POA: Diagnosis not present

## 2019-01-12 LAB — CUP PACEART REMOTE DEVICE CHECK
Date Time Interrogation Session: 20200712123815
Implantable Pulse Generator Implant Date: 20170907

## 2019-01-21 ENCOUNTER — Ambulatory Visit
Admission: RE | Admit: 2019-01-21 | Discharge: 2019-01-21 | Disposition: A | Discharge status: Home / Self Care | Payer: Medicare Other | Source: Ambulatory Visit | Attending: Family Medicine | Admitting: Family Medicine

## 2019-01-21 ENCOUNTER — Other Ambulatory Visit: Payer: Self-pay | Admitting: Family Medicine

## 2019-01-21 DIAGNOSIS — M25552 Pain in left hip: Secondary | ICD-10-CM

## 2019-01-23 NOTE — Progress Notes (Signed)
Carelink Summary Report / Loop Recorder 

## 2019-02-13 ENCOUNTER — Ambulatory Visit (INDEPENDENT_AMBULATORY_CARE_PROVIDER_SITE_OTHER): Payer: Medicare Other | Admitting: *Deleted

## 2019-02-13 DIAGNOSIS — I4891 Unspecified atrial fibrillation: Secondary | ICD-10-CM

## 2019-02-13 LAB — CUP PACEART REMOTE DEVICE CHECK
Date Time Interrogation Session: 20200814114333
Implantable Pulse Generator Implant Date: 20170907

## 2019-02-23 NOTE — Progress Notes (Signed)
Carelink Summary Report / Loop Recorder 

## 2019-03-18 ENCOUNTER — Ambulatory Visit (INDEPENDENT_AMBULATORY_CARE_PROVIDER_SITE_OTHER): Payer: Medicare Other | Admitting: *Deleted

## 2019-03-18 DIAGNOSIS — I4891 Unspecified atrial fibrillation: Secondary | ICD-10-CM

## 2019-03-18 LAB — CUP PACEART REMOTE DEVICE CHECK
Date Time Interrogation Session: 20200916124334
Implantable Pulse Generator Implant Date: 20170907

## 2019-03-24 NOTE — Progress Notes (Signed)
Carelink Summary Report / Loop Recorder 

## 2019-04-01 ENCOUNTER — Other Ambulatory Visit: Payer: Self-pay

## 2019-04-01 ENCOUNTER — Other Ambulatory Visit: Payer: Self-pay | Admitting: Family Medicine

## 2019-04-01 ENCOUNTER — Ambulatory Visit
Admission: RE | Admit: 2019-04-01 | Discharge: 2019-04-01 | Disposition: A | Discharge status: Home / Self Care | Payer: Medicare Other | Source: Ambulatory Visit | Attending: Family Medicine | Admitting: Family Medicine

## 2019-04-01 DIAGNOSIS — R0600 Dyspnea, unspecified: Secondary | ICD-10-CM

## 2019-04-20 ENCOUNTER — Ambulatory Visit (INDEPENDENT_AMBULATORY_CARE_PROVIDER_SITE_OTHER): Payer: Medicare Other | Admitting: *Deleted

## 2019-04-20 DIAGNOSIS — I4891 Unspecified atrial fibrillation: Secondary | ICD-10-CM

## 2019-04-21 LAB — CUP PACEART REMOTE DEVICE CHECK
Date Time Interrogation Session: 20201019142329
Implantable Pulse Generator Implant Date: 20170907

## 2019-05-11 NOTE — Progress Notes (Signed)
Carelink Summary Report / Loop Recorder 

## 2019-05-24 LAB — CUP PACEART REMOTE DEVICE CHECK
Date Time Interrogation Session: 20201121101216
Implantable Pulse Generator Implant Date: 20170907

## 2019-05-25 ENCOUNTER — Ambulatory Visit (INDEPENDENT_AMBULATORY_CARE_PROVIDER_SITE_OTHER): Payer: Medicare Other | Admitting: *Deleted

## 2019-05-25 DIAGNOSIS — I4891 Unspecified atrial fibrillation: Secondary | ICD-10-CM

## 2019-06-25 ENCOUNTER — Ambulatory Visit (INDEPENDENT_AMBULATORY_CARE_PROVIDER_SITE_OTHER): Payer: Medicare Other | Admitting: *Deleted

## 2019-06-25 DIAGNOSIS — I4891 Unspecified atrial fibrillation: Secondary | ICD-10-CM | POA: Diagnosis not present

## 2019-06-25 LAB — CUP PACEART REMOTE DEVICE CHECK
Date Time Interrogation Session: 20201224100828
Implantable Pulse Generator Implant Date: 20170907

## 2019-06-28 NOTE — Progress Notes (Signed)
ILR remote 

## 2019-07-03 HISTORY — PX: COLONOSCOPY: SHX174

## 2019-07-27 ENCOUNTER — Ambulatory Visit (INDEPENDENT_AMBULATORY_CARE_PROVIDER_SITE_OTHER): Payer: Medicare PPO | Admitting: *Deleted

## 2019-07-27 DIAGNOSIS — I4891 Unspecified atrial fibrillation: Secondary | ICD-10-CM | POA: Diagnosis not present

## 2019-07-27 LAB — CUP PACEART REMOTE DEVICE CHECK
Date Time Interrogation Session: 20210124230946
Implantable Pulse Generator Implant Date: 20170907

## 2019-08-13 ENCOUNTER — Telehealth: Payer: Self-pay | Admitting: *Deleted

## 2019-08-13 ENCOUNTER — Ambulatory Visit: Payer: Medicare PPO | Attending: Internal Medicine

## 2019-08-13 DIAGNOSIS — Z23 Encounter for immunization: Secondary | ICD-10-CM | POA: Insufficient documentation

## 2019-08-13 NOTE — Telephone Encounter (Signed)
LINQ alert received for AF episode on 08/05/19, duration 36min. ECG appears true AF vs runs of nonsustained disorganized atrial activity. [place] previously discontinued s/p AF ablation in 2012. Routed to Dr. [name] for review and recommendations.

## 2019-08-13 NOTE — Progress Notes (Signed)
   Covid-19 Vaccination Clinic  Name:  Katie Owens    MRN: [id number] DOB: [birth year]  08/13/2019  Ms. [name] was observed post Covid-19 immunization for 15 minutes without incidence. She was provided with Vaccine Information Sheet and instruction to access the V-Safe system.   Ms. [name] was instructed to call 911 with any severe reactions post vaccine: [name] Difficulty breathing  . Swelling of your face and throat  . A fast heartbeat  . A bad rash all over your body  . Dizziness and weakness    Immunizations Administered    Name Date Dose VIS Date Route   Pfizer COVID-19 Vaccine 08/13/2019 10:02 AM 0.3 mL 06/12/2019 Intramuscular   Manufacturer: [place]   Lot: [id number]   [place]: [id number]

## 2019-08-20 NOTE — Telephone Encounter (Signed)
Sinus with frequent ectopy/ disorganized atrial activity Continue to monitor without change at this time.

## 2019-08-27 ENCOUNTER — Ambulatory Visit (INDEPENDENT_AMBULATORY_CARE_PROVIDER_SITE_OTHER): Payer: Medicare PPO | Admitting: *Deleted

## 2019-08-27 DIAGNOSIS — I4891 Unspecified atrial fibrillation: Secondary | ICD-10-CM

## 2019-08-27 LAB — CUP PACEART REMOTE DEVICE CHECK
Date Time Interrogation Session: 20210224234941
Implantable Pulse Generator Implant Date: 20170907

## 2019-08-27 NOTE — Progress Notes (Signed)
ILR Remote 

## 2019-09-05 ENCOUNTER — Ambulatory Visit: Payer: Medicare PPO | Attending: Internal Medicine

## 2019-09-05 DIAGNOSIS — Z23 Encounter for immunization: Secondary | ICD-10-CM | POA: Insufficient documentation

## 2019-09-05 NOTE — Progress Notes (Signed)
   Covid-19 Vaccination Clinic  Name:  Katie Owens    MRN: [id number] DOB: 30-Dec-1950  09/05/2019  [name] was observed post Covid-19 immunization for 15 minutes without incident. She was provided with Vaccine Information Sheet and instruction to access the V-Safe system.   [name] was instructed to call 911 with any severe reactions post vaccine: [name] Difficulty breathing  . Swelling of face and throat  . A fast heartbeat  . A bad rash all over body  . Dizziness and weakness   Immunizations Administered    Name Date Dose VIS Date Route   Pfizer COVID-19 Vaccine 09/05/2019  2:43 PM 0.3 mL 06/12/2019 Intramuscular   Manufacturer: [place]   Lot: [id number]   [place]: [id number]

## 2019-09-28 ENCOUNTER — Ambulatory Visit (INDEPENDENT_AMBULATORY_CARE_PROVIDER_SITE_OTHER): Payer: Medicare PPO | Admitting: *Deleted

## 2019-09-28 ENCOUNTER — Telehealth: Payer: Self-pay

## 2019-09-28 DIAGNOSIS — I4891 Unspecified atrial fibrillation: Secondary | ICD-10-CM

## 2019-09-28 LAB — CUP PACEART REMOTE DEVICE CHECK
Date Time Interrogation Session: 20210328021338
Implantable Pulse Generator Implant Date: 20170907

## 2019-09-28 NOTE — Telephone Encounter (Signed)
Patient Loop has reached RRT. Called patient to see about leaving in or taking out. LMOVM to call DC back with office hours and direct number provided.

## 2019-09-29 NOTE — Progress Notes (Signed)
ILR Remote 

## 2019-10-01 NOTE — Telephone Encounter (Signed)
Informed patient battery has reached RRT. Patient made aware of 2 options w/ Loop Recorder, 1. To leave in device, 2. To have device removed. Patient verbally stated she wants to leave device in since she is already used to it. Patient informed she will not longer be monitored with loop recorder. Verbalizes understanding. Patient informed of next office visit with Elouise Munroe, PA on 10/21/19 @ 3:15.

## 2019-10-08 NOTE — Progress Notes (Deleted)
Cardiology Office Note Date:  10/08/2019  Patient ID:  Katie, Owens 01/23/1951, MRN [id number] PCP:  Derinda Late, MD  Electrophysiologist:  Dr. [name]   Chief Complaint: ***  History of Present Illness: Katie Owens is a 69 y.o. female with history of AFib s/p afib ablation at [facility] by Dr Clyda Hurdle in 2012, DM, fibromyalgia, HTN, HLD, OSA w/CPAP.   She saw Dr. [name] spring 2018, at that time discussed her symptoms of palpitations prompted ILR implant, to date of that visit, palpitations were attributed to PVCs, a brief ATach had been observed, no AFib.  The patient opted to stop Xarelto at that time given no AFib.  Understanding that if AF were to be noted by her ILR would need to resume. A  subsequent telephone note a pre-syncopal spell with brief ATach.   She was seen in the ER in April 2019 for a near syncopal event, no clear etiology was noted, symptoms resolved and discharged.   She reported she had drank more then usual Ice Tea and thinks the extra caffeine, shrimp in the food she had made her BP high and feel poorly, her PMD resumed her chlorthalidone and mentions he changed her other BP medicine to Irbesartan as well. I saw after this she felt like her heart rate was faster then usual the day prior, maybe slightly lightheaded, she was seated, and self resolved quickly.   She denied orthostatic symptoms.  She had not felt like she hash had any AFib.  This being a very specific and different symptoms then her "faster rates".  Denied CP or SOB.  No syncope. ILR had no observations, no arrhythmias.  She has net been seen again. Device clinic noted device hda reached RRT, discussed with the patient, she preferred to keep the device in.  08/20/19: device clinic f/u note by Dr. [name] noted  Sinus with frequent ectopy/ disorganized atrial activity Continue to monitor without change at this time  *** symptoms, syncope? palps? *** site ok? *** meds *** burden *** able to  interrogate? *** CPAP     Device information: MDT ILR implanted 03/08/16, Dr. [name]  Past Medical History:  Diagnosis Date  . Arthritis   . Asthma   . Cancer [facility]) 1995   breast cancer lt with 12 nodes out  . Chronic kidney disease    hx stones  . Contact lens/glasses fitting    wears contacts or glasses  . Depression   . Diabetes mellitus without complication ([place])   . Dysrhythmia    hx AF  . Fibromyalgia   . GERD (gastroesophageal reflux disease)   . Hyperlipemia   . Hypertension   . IBS (irritable bowel syndrome)   . PONV (postoperative nausea and vomiting)   . Seasonal allergies   . Shortness of breath   . Sleep apnea    uses a cpap    Past Surgical History:  Procedure Laterality Date  . BREAST LUMPECTOMY WITH AXILLARY LYMPH NODE DISSECTION  1995   12 nodes removed  . CARDIAC CATHETERIZATION  01/24/2009   left heart  . CARDIAC ELECTROPHYSIOLOGY [place] AND ABLATION  2012  . CARPAL TUNNEL RELEASE Right 10/14/2012   Procedure: CARPAL TUNNEL RELEASE;  Surgeon: Wynonia Sours, MD;  Location: [place];  Service: Orthopedics;  Laterality: Right;  . COLONOSCOPY    . DIAGNOSTIC LAPAROSCOPY  1990  . EP IMPLANTABLE DEVICE N/A 03/08/2016   Procedure: Loop Recorder Insertion;  Surgeon: Thompson Grayer, MD;  Location: [place] [facility];  Service: Cardiovascular;  Laterality: N/A;  . LUMBAR FUSION    . TONSILLECTOMY      Current Outpatient Medications  Medication Sig Dispense Refill  . ALPRAZolam (XANAX) 0.5 MG tablet Take 0.5 mg by mouth at bedtime as needed for sleep.   1  . Ascorbic Acid (VITAMIN C) 1000 MG tablet Take 2,000 mg by mouth daily.     [name] b complex vitamins tablet Take 1 tablet by mouth daily.    . beclomethasone (QVAR) 80 MCG/ACT inhaler Inhale 1 puff into the lungs 2 (two) times daily.     . carvedilol (COREG) 25 MG tablet Take 1 tablet (25 mg total) by mouth 2 (two) times daily. 180 tablet 3  . chlorthalidone (HYGROTON) 50 MG tablet Take 50 mg  by mouth daily.    . cholecalciferol (VITAMIN D) 1000 UNITS tablet Take 2,000 Units by mouth daily.     [name] co-enzyme Q-10 30 MG capsule Take 30 mg by mouth daily.     . cyclobenzaprine (FLEXERIL) 10 MG tablet Take 10 mg by mouth at bedtime.    [name] diltiazem (CARDIZEM) 30 MG tablet Take 1 tablet by mouth every 4 hours AS NEEDED for HR>100 and BP>100    . diphenhydrAMINE (BENADRYL) 25 MG tablet Take 25 mg by mouth every 6 (six) hours as needed for allergies.    . DULoxetine (CYMBALTA) 60 MG capsule Take 1 capsule by mouth daily.    [name] etodolac (LODINE) 400 MG tablet Take 400 mg by mouth 2 (two) times daily.   7  . HYDROcodone-acetaminophen (NORCO) 5-325 MG per tablet Take 1 tablet by mouth every 6 (six) hours as needed for pain. 30 tablet 0  . irbesartan (AVAPRO) 150 MG tablet Take 150 mg by mouth daily.    [name] lidocaine (LIDODERM) 5 % Place 1 patch onto the skin daily as needed (pain). Remove & Discard patch within 12 hours or as directed by MD    . loperamide (IMODIUM) 2 MG capsule Take 2 mg by mouth every morning.     [name] losartan (COZAAR) 50 MG tablet Take 50 mg by mouth daily.    . metFORMIN (GLUCOPHAGE-XR) 500 MG 24 hr tablet Take 2 tablets by mouth 2 (two) times daily.     . Multiple Vitamins-Minerals (MULTIVITAMIN ADULT PO) Take 1 tablet by mouth daily.    [name] omeprazole (PRILOSEC) 20 MG capsule Take 20 mg by mouth daily.    . potassium chloride SA (K-DUR,KLOR-CON) 20 MEQ tablet Take 20 mEq by mouth daily.     . pregabalin (LYRICA) 75 MG capsule Take 75 mg by mouth 2 (two) times daily.    . rosuvastatin (CRESTOR) 10 MG tablet Take 10 mg by mouth daily.    . TRADJENTA 5 MG TABS tablet Take 5 mg by mouth every morning.  0  . VENTOLIN HFA 108 (90 BASE) MCG/ACT inhaler Inhale 1-2 puffs into the lungs every 6 (six) hours as needed for wheezing or shortness of breath.   3  . vitamin B-12 (CYANOCOBALAMIN) 1000 MCG tablet Take 5,000 mcg by mouth daily.      No current facility-administered medications for  this visit.    Allergies:   Penicillins, Aspirin, and Ibuprofen   Social History:  The patient  reports that she quit smoking about 40 years ago. Her smoking use included cigarettes. She has a 30.00 pack-year smoking history. She has never used smokeless tobacco. She reports that she does not drink  alcohol or use drugs.   Family History:  The patient's family history includes Alcoholism in her brother, father, and mother; Anemia in her brother; Heart attack in her maternal grandmother; Heart disease in her brother; Hyperlipidemia in her brother and maternal grandmother; Hypertension in her brother, maternal grandmother, and mother; Other in her brother; Stroke in her mother.  ROS:  Please see the history of present illness.  All other systems are reviewed and otherwise negative.   PHYSICAL EXAM:  VS:  There were no vitals taken for this visit. BMI: There is no height or weight on file to calculate BMI. Well nourished, well developed, in no acute distress  HEENT: normocephalic, atraumatic  Neck: no JVD, carotid bruits or masses Cardiac: *** RRR; no significant murmurs, no rubs, or gallops Lungs:  *** CTA b/l, no wheezing, rhonchi or rales  Abd: soft, non-tender, obese MS: no deformity or *** atrophy Ext:  *** no edema  Skin: warm and dry, no rash Neuro:  No gross deficits appreciated Psych: euthymic mood, full affect  *** ILR site is stable, no tethering or discomfort   EKG:  Done today and reviewed by myself: ***  ILR interrogation done today and reviewed by myself: ***   09/11/10: Cardiac CT  Non-Cardiac Findings: Vascular calcification in the left breast. Apparent asymmetrically increasedglandular tissue in the left breast may be artifactual since a large lateral portion of the right breast is not included in the field of view. Mammography is suggested if the patient has not had a recent mammogram. Impressions: - Pulmonary vein anatomy as noted above - Mildly dilated  left atrium - Upper normal to mIldly dilated pulmonary arteries - Breast findings as noted above   01/24/09: LHC Normal coronaries Normal LVEF No significant AS or MR  Recent Labs: No results found for requested labs within last 8760 hours.  No results found for requested labs within last 8760 hours.   CrCl cannot be calculated (Patient's most recent lab result is older than the maximum 21 days allowed.).   Wt Readings from Last 3 Encounters:  11/21/17 225 lb (102.1 kg)  10/09/17 234 lb (106.1 kg)  04/16/17 230 lb (104.3 kg)     Other studies reviewed: Additional studies/records reviewed today include: summarized above  ASSESSMENT AND PLAN:  1. AFib      s/p AF ablation 2012     CHA2DS2Vasc is at least 4, *** off a/c post ablation w/o recurrent AF     Continue to monitor via ILR    *** No AF to date by ILR  2. HTN     Looks OK, she is reminded to maintain adequate hydration with the addition of her chlorthalidone  3. ATach     *** Her symptoms are less frequent and shorter then her AF symptoms ever were     *** She is very happy with her current regime   Disposition: ***  Current medicines are reviewed at length with the patient today.  The patient did not have any concerns regarding medicines.  Katie Night, PA-C 10/08/2019 7:07 PM     [place] [address] [phone] (office)  [phone] (fax)

## 2019-10-09 ENCOUNTER — Encounter: Payer: Medicare PPO | Admitting: Physician Assistant

## 2019-10-20 NOTE — Progress Notes (Signed)
Cardiology Office Note Date:  10/20/2019  Patient ID:  Katie Owens February 22, 1951, MRN [id number] PCP:  Derinda Late, MD  Electrophysiologist:  Dr. [name]   Chief Complaint: fatigue  History of Present Illness: Katie Owens is a 69 y.o. female with history of AFib s/p afib ablation at [facility] by Dr Clyda Hurdle in 2012, DM, fibromyalgia, HTN, HLD, OSA w/CPAP.   She saw Dr. [name] spring 2018, at that time discussed her symptoms of palpitations prompted ILR implant, to date of that visit, palpitations were attributed to PVCs, a brief ATach had been observed, no AFib.  The patient opted to stop Xarelto at that time given no AFib.  Understanding that if AF were to be noted by her ILR would need to resume. A  subsequent telephone note a pre-syncopal spell with brief ATach.   She was seen in the ER in April 2019 for a near syncopal event, no clear etiology was noted, symptoms resolved and discharged.   She reported she had drank more then usual Ice Tea and thinks the extra caffeine, shrimp in the food she had made her BP high and feel poorly, her PMD resumed her chlorthalidone and mentions he changed her other BP medicine to Irbesartan as well. I saw after this she felt like her heart rate was faster then usual the day prior, maybe slightly lightheaded, she was seated, and self resolved quickly.   She denied orthostatic symptoms.  She had not felt like she hash had any AFib.  This being a very specific and different symptoms then her "faster rates".  Denied CP or SOB.  No syncope. ILR had no observations, no arrhythmias.  She has not been seen again. Device clinic noted device hda reached RRT, discussed with the patient, she preferred to keep the device in.  08/20/19: device clinic f/u note by Dr. [name] noted  Sinus with frequent ectopy/ disorganized atrial activity Continue to monitor without change at this time  She mentions that for a couple months she just has no energy, just does not  feel like doing anything at all.  She suffers with depression and felt like this could be the cause and her medicines have been adjusted by her PMD.  Though has not gotten much improvement.  She has little exertional capacity, w gets winded with any increased exertional activities, even casual walking if it is of any distance, this is her baseline.  That being said, since she retired in 2017, spends much of her time home seated.  She does not exercise, never has.  She has stopped using her CPAP for no clear reason, just does not wear it.  She denies any kind of CP, no rest SOB, no dizzy spells near syncope or syncope, outside if the occasional fleeting lighteaded feeling when standing. She has occasional palpitations that are a feeling of a stop/missed beat and starts up, nothing persistent, long lasting.  She suspects these are her PVCs She hasnot felt like she has had any AFib    Device information: MDT ILR implanted 03/08/16, Dr. [name]  Past Medical History:  Diagnosis Date  . Arthritis   . Asthma   . Cancer [facility]) 1995   breast cancer lt with 12 nodes out  . Chronic kidney disease    hx stones  . Contact lens/glasses fitting    wears contacts or glasses  . Depression   . Diabetes mellitus without complication ([place])   . Dysrhythmia    hx AF  .  Fibromyalgia   . GERD (gastroesophageal reflux disease)   . Hyperlipemia   . Hypertension   . IBS (irritable bowel syndrome)   . PONV (postoperative nausea and vomiting)   . Seasonal allergies   . Shortness of breath   . Sleep apnea    uses a cpap    Past Surgical History:  Procedure Laterality Date  . BREAST LUMPECTOMY WITH AXILLARY LYMPH NODE DISSECTION  1995   12 nodes removed  . CARDIAC CATHETERIZATION  01/24/2009   left heart  . CARDIAC ELECTROPHYSIOLOGY [place] AND ABLATION  2012  . CARPAL TUNNEL RELEASE Right 10/14/2012   Procedure: CARPAL TUNNEL RELEASE;  Surgeon: Wynonia Sours, MD;  Location: [place];   Service: Orthopedics;  Laterality: Right;  . COLONOSCOPY    . DIAGNOSTIC LAPAROSCOPY  1990  . EP IMPLANTABLE DEVICE N/A 03/08/2016   Procedure: Loop Recorder Insertion;  Surgeon: Thompson Grayer, MD;  Location: [place] [facility];  Service: Cardiovascular;  Laterality: N/A;  . LUMBAR FUSION    . TONSILLECTOMY      Current Outpatient Medications  Medication Sig Dispense Refill  . ALPRAZolam (XANAX) 0.5 MG tablet Take 0.5 mg by mouth at bedtime as needed for sleep.   1  . Ascorbic Acid (VITAMIN C) 1000 MG tablet Take 2,000 mg by mouth daily.     [name] b complex vitamins tablet Take 1 tablet by mouth daily.    . beclomethasone (QVAR) 80 MCG/ACT inhaler Inhale 1 puff into the lungs 2 (two) times daily.     . carvedilol (COREG) 25 MG tablet Take 1 tablet (25 mg total) by mouth 2 (two) times daily. 180 tablet 3  . chlorthalidone (HYGROTON) 50 MG tablet Take 50 mg by mouth daily.    . cholecalciferol (VITAMIN D) 1000 UNITS tablet Take 2,000 Units by mouth daily.     [name] co-enzyme Q-10 30 MG capsule Take 30 mg by mouth daily.     . cyclobenzaprine (FLEXERIL) 10 MG tablet Take 10 mg by mouth at bedtime.    [name] diltiazem (CARDIZEM) 30 MG tablet Take 1 tablet by mouth every 4 hours AS NEEDED for HR>100 and BP>100    . diphenhydrAMINE (BENADRYL) 25 MG tablet Take 25 mg by mouth every 6 (six) hours as needed for allergies.    . DULoxetine (CYMBALTA) 60 MG capsule Take 1 capsule by mouth daily.    [name] etodolac (LODINE) 400 MG tablet Take 400 mg by mouth 2 (two) times daily.   7  . HYDROcodone-acetaminophen (NORCO) 5-325 MG per tablet Take 1 tablet by mouth every 6 (six) hours as needed for pain. 30 tablet 0  . irbesartan (AVAPRO) 150 MG tablet Take 150 mg by mouth daily.    [name] lidocaine (LIDODERM) 5 % Place 1 patch onto the skin daily as needed (pain). Remove & Discard patch within 12 hours or as directed by MD    . loperamide (IMODIUM) 2 MG capsule Take 2 mg by mouth every morning.     [name] losartan (COZAAR) 50 MG  tablet Take 50 mg by mouth daily.    . metFORMIN (GLUCOPHAGE-XR) 500 MG 24 hr tablet Take 2 tablets by mouth 2 (two) times daily.     . Multiple Vitamins-Minerals (MULTIVITAMIN ADULT PO) Take 1 tablet by mouth daily.    [name] omeprazole (PRILOSEC) 20 MG capsule Take 20 mg by mouth daily.    . potassium chloride SA (K-DUR,KLOR-CON) 20 MEQ tablet Take 20 mEq by mouth daily.     [name]  pregabalin (LYRICA) 75 MG capsule Take 75 mg by mouth 2 (two) times daily.    . rosuvastatin (CRESTOR) 10 MG tablet Take 10 mg by mouth daily.    . TRADJENTA 5 MG TABS tablet Take 5 mg by mouth every morning.  0  . VENTOLIN HFA 108 (90 BASE) MCG/ACT inhaler Inhale 1-2 puffs into the lungs every 6 (six) hours as needed for wheezing or shortness of breath.   3  . vitamin B-12 (CYANOCOBALAMIN) 1000 MCG tablet Take 5,000 mcg by mouth daily.      No current facility-administered medications for this visit.    Allergies:   Penicillins, Aspirin, and Ibuprofen   Social History:  The patient  reports that she quit smoking about 40 years ago. Her smoking use included cigarettes. She has a 30.00 pack-year smoking history. She has never used smokeless tobacco. She reports that she does not drink alcohol or use drugs.   Family History:  The patient's family history includes Alcoholism in her brother, father, and mother; Anemia in her brother; Heart attack in her maternal grandmother; Heart disease in her brother; Hyperlipidemia in her brother and maternal grandmother; Hypertension in her brother, maternal grandmother, and mother; Other in her brother; Stroke in her mother.  ROS:  Please see the history of present illness.  All other systems are reviewed and otherwise negative.   PHYSICAL EXAM:  VS:  There were no vitals taken for this visit. BMI: There is no height or weight on file to calculate BMI. Well nourished, well developed, in no acute distress  HEENT: normocephalic, atraumatic  Neck: no JVD, carotid bruits or  masses Cardiac: RRR; no significant murmurs, no rubs, or gallops Lungs:  CTA b/l, no wheezing, rhonchi or rales  Abd: soft, non-tender, obese MS: no deformity or atrophy Ext:  no edema  Skin: warm and dry, no rash Neuro:  No gross deficits appreciated Psych: euthymic mood, full affect  ILR site is stable, no tethering or discomfort   EKG:  Done today and reviewed by myself:  SR, 87bpm,   ILR interrogation done today and reviewed by myself:  Batter reached RRT 09/27/19 SR today R waves 0.44 No device observation since the already evaluated episode on 08/05/19 described as disorganized atrial activity (24minute duration) 03/22/2018 the only other since her last programmer check and this was ST with PVCs, no true AFib in review of EGM    09/11/10: Cardiac CT  Non-Cardiac Findings: Vascular calcification in the left breast. Apparent asymmetrically increasedglandular tissue in the left breast may be artifactual since a large lateral portion of the right breast is not included in the field of view. Mammography is suggested if the patient has not had a recent mammogram. Impressions: - Pulmonary vein anatomy as noted above - Mildly dilated left atrium - Upper normal to mIldly dilated pulmonary arteries - Breast findings as noted above   01/24/09: LHC Normal coronaries Normal LVEF No significant AS or MR  Recent Labs: No results found for requested labs within last 8760 hours.  No results found for requested labs within last 8760 hours.   CrCl cannot be calculated (Patient's most recent lab result is older than the maximum 21 days allowed.).   Wt Readings from Last 3 Encounters:  11/21/17 225 lb (102.1 kg)  10/09/17 234 lb (106.1 kg)  04/16/17 230 lb (104.3 kg)     Other studies reviewed: Additional studies/records reviewed today include: summarized above  ASSESSMENT AND PLAN:  1. AFib  s/p AF ablation 2012     CHA2DS2Vasc is at least 4, off a/c post ablation w/o  recurrent AF     She has reached RRT on her loop.  Automatic transmissions will no longer occur.     Offered to remove it. She inquires about getting another implanted, says she just feels better with her heart rhythm bing monitored.      Dicussed that if she were to have symptoms that felt like her Afib to her, we may pursue further monitoring though did not think we would otherwise.       She would prefer not to have the ILR removed then  2. HTN     Looks OK  Mention of occasional orthostatic sounding symptom Seated 110/60, 86bpm Standing 108/60, 89bpm 28min 116/72, 93bom  Encouraged to stay adequately hydrated  3. Fatigue, lack of energy DOE, palpitations     Urged to resume nightly use of her CPAP     Long discussion on importance of regular exercise and strategies to work this in to her days ans well as weight loss     Also to continue follow up with her PMD regarding her depression and management   Disposition: will see her back in 4 months or so to see how she is doing with CPAP compliance, exercise, symptoms.  She seems motivated.  Current medicines are reviewed at length with the patient today.  The patient did not have any concerns regarding medicines.  Venetia Night, PA-C 10/20/2019 1:20 PM     [place] [address]  [phone] (office)  [phone] (fax)

## 2019-10-21 ENCOUNTER — Encounter: Payer: Medicare PPO | Admitting: Physician Assistant

## 2019-10-21 ENCOUNTER — Other Ambulatory Visit: Payer: Self-pay

## 2019-10-21 ENCOUNTER — Ambulatory Visit: Payer: Medicare PPO | Admitting: Physician Assistant

## 2019-10-21 VITALS — BP 99/55 | HR 87 | Ht 59.0 in | Wt 225.0 lb

## 2019-10-21 DIAGNOSIS — I10 Essential (primary) hypertension: Secondary | ICD-10-CM | POA: Diagnosis not present

## 2019-10-21 DIAGNOSIS — R002 Palpitations: Secondary | ICD-10-CM | POA: Diagnosis not present

## 2019-10-21 DIAGNOSIS — I480 Paroxysmal atrial fibrillation: Secondary | ICD-10-CM | POA: Diagnosis not present

## 2019-10-21 DIAGNOSIS — Z4509 Encounter for adjustment and management of other cardiac device: Secondary | ICD-10-CM

## 2019-10-21 NOTE — Patient Instructions (Signed)
Medication Instructions:  Your physician recommends that you continue on your current medications as directed. Please refer to the Current Medication list given to you today.  *If you need a refill on your cardiac medications before your next appointment, please call your pharmacy*   Lab Work: .nn If you have labs (blood work) drawn today and your tests are completely normal, you will receive your results only by: [name] MyChart Message (if you have MyChart) OR . A paper copy in the mail If you have any lab test that is abnormal or we need to change your treatment, we will call you to review the results.   Testing/Procedures: NONE ORDERED  TODAY   Follow-Up: At [facility], you and your health needs are our priority.  As part of our continuing mission to provide you with exceptional heart care, we have created designated Provider Care Teams.  These Care Teams include your primary Cardiologist (physician) and Advanced Practice Providers (APPs -  Physician Assistants and Nurse Practitioners) who all work together to provide you with the care you need, when you need it.  We recommend signing up for the patient portal called "MyChart".  Sign up information is provided on this After Visit Summary.  MyChart is used to connect with patients for Virtual Visits (Telemedicine).  Patients are able to view lab/test results, encounter notes, upcoming appointments, etc.  Non-urgent messages can be sent to your provider as well.   To learn more about what you can do with MyChart, go to [email or web address].    Your next appointment:   4 month(s)  The format for your next appointment:   In Person  Provider:  You may see\ Tommye Standard, PA-C   Other Instructions

## 2019-11-27 IMAGING — CR DG LUMBAR SPINE COMPLETE 4+V
5 series · 5 of 5 positions shown · non-contrast
Comparison: Lumbar radiographs 02/06/2010.

CLINICAL DATA: 66-year-old female with increasing pain radiating to
the left buttock, thigh and knee with no known injury. Prior lumbar
surgery.

EXAM:
LUMBAR SPINE - COMPLETE 4+ VIEW

[t lumbar spine ap]
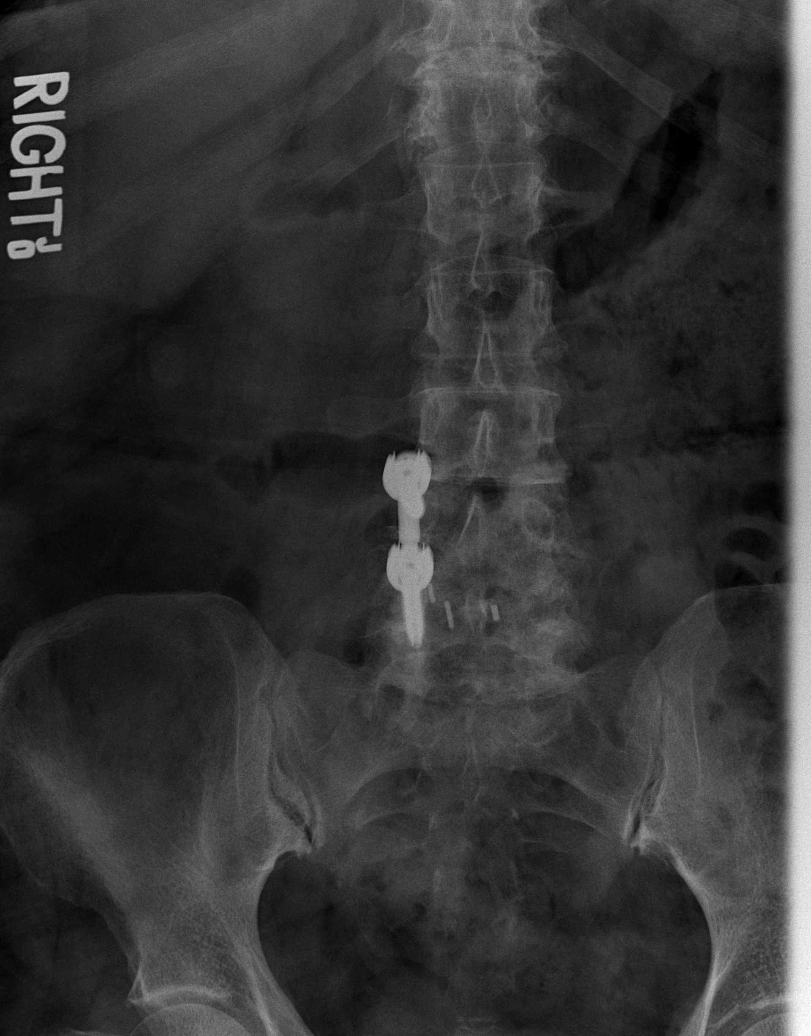

[t lumbar spine obl (1 of 2)]
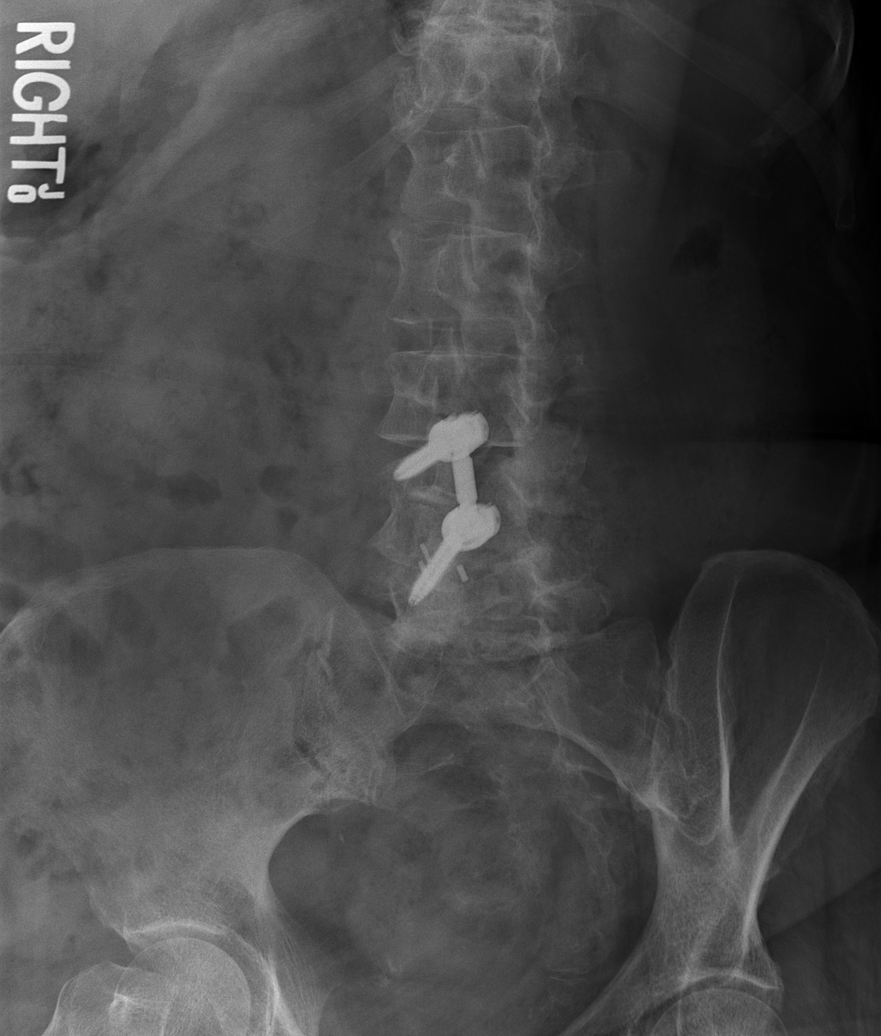

[t lumbar spine obl (2 of 2)]
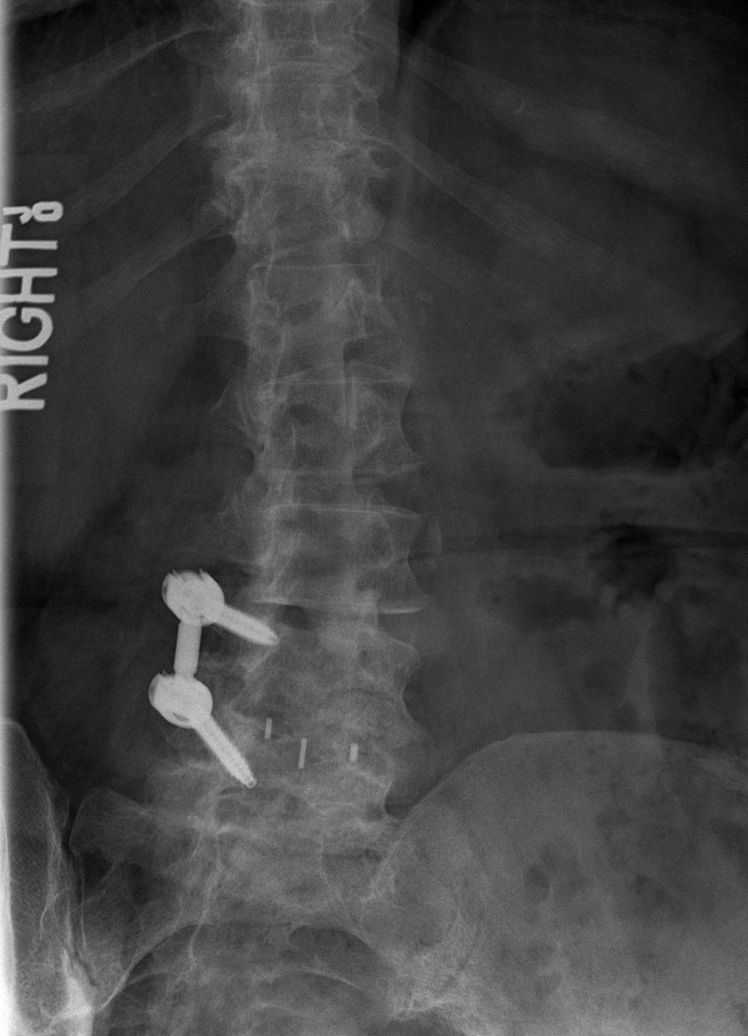

[t lumbar spine lat]
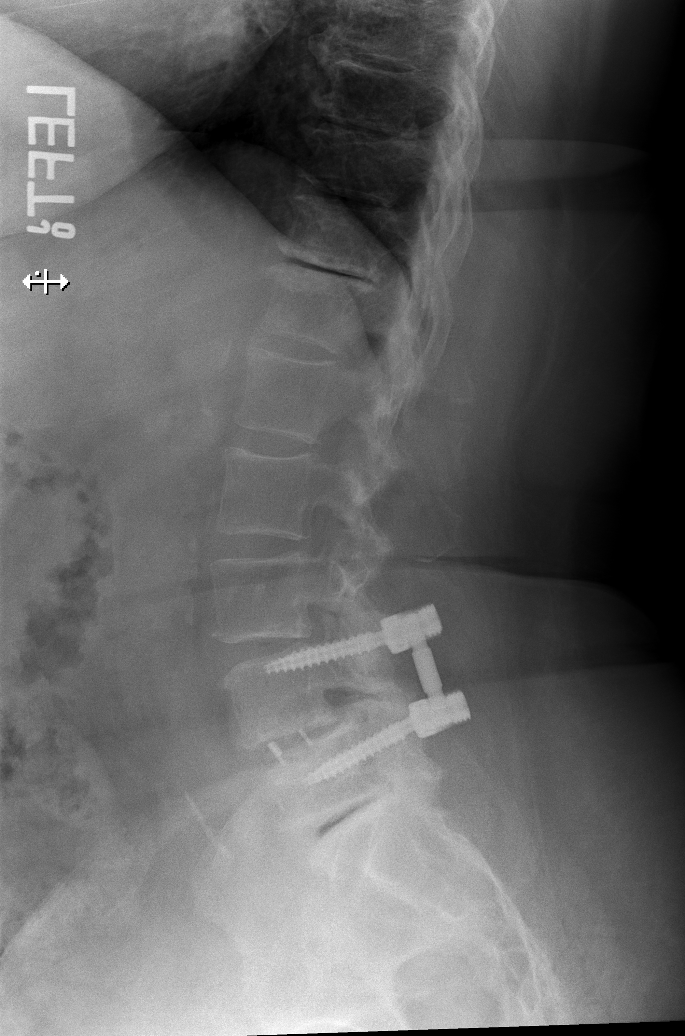

[t lumbar l-5 s-1 spot]
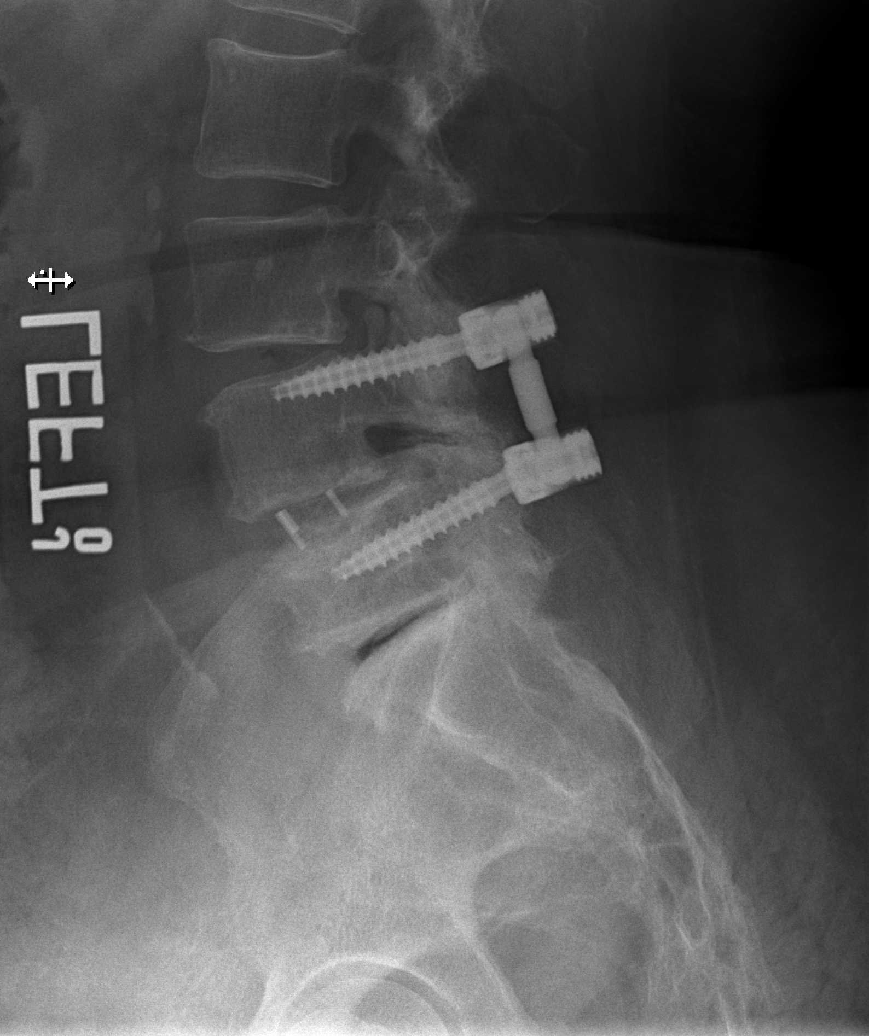

[5 of 5 positions shown; findings below may reference images not displayed]

FINDINGS: Prior right side unilateral transpedicular and interbody fusion
hardware placement at L4-L5. Normal lumbar segmentation. Hardware
appears stable since 3422 and intact. No arthrodesis is evident
radiographically. Mild anterolisthesis of L4 on L5 appears stable.

Chronic vacuum disc at L5-S1 with endplate sclerosis and spurring
mildly progressed. Chronic L5-S1 facet hypertrophy. Trace new vacuum
disc at L3-L4 with posterior disc space loss since 3422.

The upper lumbar levels appear stable. Chronic vacuum disc at
T11-T12 with progressed endplate sclerosis and spurring. Visible
lower thoracic levels appear intact. Sacral ala and SI joints appear
stable and within normal limits. No acute osseous abnormality
identified. Calcified aortic atherosclerosis.
IMPRESSION: 1. Chronic postoperative changes at L4-L5. Progressed adjacent
segment disease at L3-L4 since 3422, and severe chronic disc
degeneration at the adjacent L5-S1 segment.
2.  No acute osseous abnormality identified.
3. Advanced chronic disc and endplate degeneration at T11-T12 with
some progression since [DATE].  Aortic Atherosclerosis ([id number]).

## 2020-05-02 ENCOUNTER — Institutional Professional Consult (permissible substitution): Payer: Medicare PPO | Admitting: Pulmonary Disease

## 2020-05-31 ENCOUNTER — Institutional Professional Consult (permissible substitution): Payer: Medicare PPO | Admitting: Pulmonary Disease

## 2020-06-15 ENCOUNTER — Ambulatory Visit: Payer: Medicare PPO | Admitting: Pulmonary Disease

## 2020-06-15 ENCOUNTER — Other Ambulatory Visit: Payer: Self-pay

## 2020-06-15 ENCOUNTER — Encounter: Payer: Self-pay | Admitting: Pulmonary Disease

## 2020-06-15 DIAGNOSIS — G4733 Obstructive sleep apnea (adult) (pediatric): Secondary | ICD-10-CM

## 2020-06-15 DIAGNOSIS — G4721 Circadian rhythm sleep disorder, delayed sleep phase type: Secondary | ICD-10-CM

## 2020-06-15 NOTE — Assessment & Plan Note (Signed)
Prescription for auto CPAP 10 to 16 cm with full facemask of choice will be sent to DME. If necessary, we will proceed with a home sleep test.  Weight loss encouraged, compliance with goal of at least 4-6 hrs every night is the expectation. Advised against medications with sedative side effects Cautioned against driving when sleepy - understanding that sleepiness will vary on a day to day basis

## 2020-06-15 NOTE — Progress Notes (Addendum)
Subjective:    Patient ID: Katie Owens, female    DOB: [birth year], 69 y.o.   MRN: [id number]  HPI  69 year old retiree presents to reestablish care for OSA. PMH -atrial fibrillation p-ablation, mild intermittent asthma, hypertension, breast cancer  Severe OSA was diagnosed more than 15 years ago, she initially used CPAP for a while and then stopped .  A titration study in 2010 had shown requirement of 16 cm of CPAP when her weight was 266 pounds. She was again tested in 2016 and showed an AHI of 30/hour.  She saw my partner Dr. Halford Chessman in 2017 and try to get back on her old CPAP machine which again she used for a little while and then stopped using. She now reports excessive daytime somnolence and fatigue.  She feels that since retirement she has gotten off schedule she wants to sleep in the day and she is awake at night. Epworth sleepiness score is 14 and she reports sleepiness while sitting and reading, watching TV, lying down to rest in the afternoons. Bedtime is as late as 2-3 a.m., sleep latency can be 1 to 2 hours, she denies nocturnal awakenings or nocturia, is out of bed around 10 AM to feed the cat and then gets back into bed and sleeps until 2 PM waking up feeling tired without dryness of mouth or headaches.  compared to 10 years ago she has lost 40 pounds overall       Significant tests/ events reviewed  PSG 05/01/15 >> AHI 30.2, SpO2 low 86%  05/2009 CPAP titration-266 pounds-16 cm Previous sleep study showed AHI of 28   Past Medical History:  Diagnosis Date  . Arthritis   . Asthma   . Cancer [facility]) 1995   breast cancer lt with 12 nodes out  . Chronic kidney disease    hx stones  . Contact lens/glasses fitting    wears contacts or glasses  . Depression   . Diabetes mellitus without complication ([place])   . Dysrhythmia    hx AF  . Fibromyalgia   . GERD (gastroesophageal reflux disease)   . Hyperlipemia   . Hypertension   . IBS (irritable bowel syndrome)   .  PONV (postoperative nausea and vomiting)   . Seasonal allergies   . Shortness of breath   . Sleep apnea    uses a cpap   Past Surgical History:  Procedure Laterality Date  . BREAST LUMPECTOMY WITH AXILLARY LYMPH NODE DISSECTION  1995   12 nodes removed  . CARDIAC CATHETERIZATION  01/24/2009   left heart  . CARDIAC ELECTROPHYSIOLOGY [place] AND ABLATION  2012  . CARPAL TUNNEL RELEASE Right 10/14/2012   Procedure: CARPAL TUNNEL RELEASE;  Surgeon: Wynonia Sours, MD;  Location: [place];  Service: Orthopedics;  Laterality: Right;  . COLONOSCOPY    . DIAGNOSTIC LAPAROSCOPY  1990  . EP IMPLANTABLE DEVICE N/A 03/08/2016   Procedure: Loop Recorder Insertion;  Surgeon: Thompson Grayer, MD;  Location: [place] [facility];  Service: Cardiovascular;  Laterality: N/A;  . LUMBAR FUSION    . TONSILLECTOMY      Allergies  Allergen Reactions  . Penicillins     Unknown  . Aspirin Hives  . Ibuprofen Hives    Social History   Socioeconomic History  . Marital status: Divorced    Spouse name: Not on file  . Number of children: Not on file  . Years of education: Not on file  . Highest education level: Not  on file  Occupational History  . Not on file  Tobacco Use  . Smoking status: Former Smoker    Packs/day: 2.00    Years: 15.00    Pack years: 30.00    Types: Cigarettes    Quit date: 10/08/1979    Years since quitting: 40.7  . Smokeless tobacco: Never Used  Substance and Sexual Activity  . Alcohol use: No    Alcohol/week: 0.0 standard drinks  . Drug use: No  . Sexual activity: Not on file  Other Topics Concern  . Not on file  Social History Narrative  . Not on file   Social Determinants of Health   Financial Resource Strain: Not on file  Food Insecurity: Not on file  Transportation Needs: Not on file  Physical Activity: Not on file  Stress: Not on file  Social Connections: Not on file  Intimate Partner Violence: Not on file     Family History  Problem Relation  Age of Onset  . Stroke Mother   . Hypertension Mother   . Alcoholism Mother   . Alcoholism Father   . Alcoholism Brother   . Hypertension Brother   . Hyperlipidemia Brother   . Anemia Brother   . Other Brother        bleeding hemorroids  . Heart disease Brother        valve surgery  . Heart attack Maternal Grandmother        heart valve surgery  . Hyperlipidemia Maternal Grandmother   . Hypertension Maternal Grandmother       Review of Systems Shortness of breath with activity Nonproductive cough Irregular heartbeats Weight gain of 17 pounds Tooth problems Nasal congestion sneezing Itching Earache Anxiety and depression Joint stiffness    Objective:   Physical Exam  Gen. Pleasant, obese, in no distress, normal affect ENT - no pallor,icterus, no post nasal drip, class 2-3 airway Neck: No JVD, no thyromegaly, no carotid bruits Lungs: no use of accessory muscles, no dullness to percussion, decreased without rales or rhonchi  Cardiovascular: Rhythm regular, heart sounds  normal, no murmurs or gallops, no peripheral edema Abdomen: soft and non-tender, no hepatosplenomegaly, BS normal. Musculoskeletal: No deformities, no cyanosis or clubbing Neuro:  alert, non focal, no tremors        Assessment & Plan:

## 2020-06-15 NOTE — Addendum Note (Signed)
Addended by: Merrilee Seashore on: 06/15/2020 04:46 PM   Modules accepted: Orders

## 2020-06-15 NOTE — Assessment & Plan Note (Signed)
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

## 2020-06-15 NOTE — Patient Instructions (Signed)
  Prescription for auto CPAP 10 to 16 cm with full facemask of choice will be sent to DME. If necessary, we will proceed with a home sleep test.  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

## 2020-06-16 ENCOUNTER — Telehealth: Payer: Self-pay | Admitting: Pulmonary Disease

## 2020-06-16 DIAGNOSIS — G4733 Obstructive sleep apnea (adult) (pediatric): Secondary | ICD-10-CM

## 2020-06-16 NOTE — Telephone Encounter (Signed)
Spoke with pt and advised that per [facility] pt will need to have a new home sleep test. Pt verbalized understanding. Order placed for HST. Nothing further needed at this time.

## 2020-06-16 NOTE — Telephone Encounter (Signed)
Please let patient know and okay to order home sleep test

## 2020-06-16 NOTE — Telephone Encounter (Signed)
Spoke with Bethanne Ginger at [facility]. They have a CPAP order on pt from 2017 but pt never started on CPAP at that time so pt will need a new home sleep study. Dr Elsworth Soho, please advise if ok to order HST.

## 2020-07-18 NOTE — Telephone Encounter (Signed)
Received the following message from patient:   "In your report you say I have gained 17 pounds since 2020 but I have actually lost 17 lbs in the last 13 months. Katie Owens"  It appears in the office note from 06/15/20. She is wanting this to be changed.   RA, please advise if you can addend her note to reflect this. Thanks!

## 2020-07-18 NOTE — Telephone Encounter (Signed)
Sorry for the error Corrected More importantly,I noted  compared to 10 yrs ago -her las study - she has lost 40 lbs

## 2020-07-29 ENCOUNTER — Other Ambulatory Visit: Payer: Self-pay

## 2020-07-29 ENCOUNTER — Ambulatory Visit: Payer: Medicare PPO

## 2020-07-29 DIAGNOSIS — G4733 Obstructive sleep apnea (adult) (pediatric): Secondary | ICD-10-CM

## 2020-08-03 DIAGNOSIS — G4733 Obstructive sleep apnea (adult) (pediatric): Secondary | ICD-10-CM | POA: Diagnosis not present

## 2020-08-03 NOTE — Telephone Encounter (Signed)
HST showed mod  OSA with AHI 19/ hr Rx for auto CPAP should have been placed already during office visit-please confirm OV with me/APP in 6 wks after starting PAP

## 2020-08-04 NOTE — Telephone Encounter (Signed)
I called [facility] and spoke to [place].  She states order for 12/15 can be used.  I just need to send message back to them thru epic and they can take care of it.  Will route back to triage so they can make patient aware thru MyChart this will be taken care of and message can be closed

## 2020-08-04 NOTE — Telephone Encounter (Signed)
Order was placed 06/15/20 after pt's OV with RA for cpap settings and this was sent to [place].  Saw that there was a message from [facility] stating that pt was needing to have another sleep study done due to her recently not using the cpap and also from when her last sleep study was.  Pt did repeat sleep study 07/30/20. Pt sent mychart message stating when she turned on her cpap machine, she does not see where settings had been changed.  Do we need to place a new order or can [facility] be able to get this taken care of with previous order that was placed 06/15/20? [facility], please advise.

## 2020-08-11 ENCOUNTER — Ambulatory Visit: Payer: Self-pay | Admitting: Surgery

## 2020-08-11 NOTE — H&P (Signed)
Katie Owens Appointment: 08/11/2020 2:30 PM Location: [facility] Patient #: [id number] DOB: 03/18/1951 Divorced / Language: Katie Owens / Race: White Female  History of Present Illness Katie Hector MD; 08/11/2020 2:44 PM) The patient is a 70 year old female who presents for evaluation of gall stones. Note for "Gall stones": ` ` ` Patient sent for surgical consultation at the request of Katie Owens  Chief Complaint: Recurrent gallbladder attacks. ` ` The patient is a morbidly obese woman with IBS, diabetes and numerous other health issues. Followed by [place] [facility]. Katie. Elsworth Soho. Recently diagnosed with obstructive sleep apnea. CPAP recommended. Frequent ectopy with disorganized atrial activity. Has a pacemaker. Followed by Katie. [name] with cardiology. Found to have episodes of pain and nausea and vomiting. Noncardiac chest pain. Ultrasound showed Gallstones. At least 5 attacks. Saw Katie. Lucia Gaskins with our group in 2019. Surgery offered. Sounds like she declined and wished to observe. Sounds like she's had some recurrent attacks. She's had some dietary issues with her diabetes but is working to change her medications around. Hemoglobin A1c was elevated. She saw endocrinology. Had some adjustments in her primary care physician. A1c less than 7 now. Wish to reconsider surgery. Katie. Lucia Gaskins has retired, so patient follows up with me.  (Review of systems as stated in this history (HPI) or in the review of systems. Otherwise all other 12 point ROS are negative)  Past Medical History: 1. History of A Fib - ablation at [facility] by Katie. Clyda Hurdle in 2012. 2. MDT ILR (insertable loop recorder) implanted 03/18/2016, Katie. [name]. She often sees his assistant, Katie Owens 3. DM since 2004 4. Fibromyalgia On Lyrica 5. HTN 6. HLD 7. OSA with CPAP also some asthma 8. History of left breast cancer - 1995 9. History of depression On Tragenta and Cymbalta 10. Morbid  obesity - BMI - 46 11. endometriosis. Seen on laparoscopy by Katie. Lemmie Evens. Mezer  12. Diabetic foot neuropathy. 13. C3-C4 neck surgery - 2015 - Katie. Hal Neer L4-L5 back surgery around 2009 14. IBS - tends toward the diarrhea side Sees Katie. Cleaster Corin.  Social History: Divorced. Retired from the [employer]. (this has to do with [employer]) No children ` ` ###########################################`  This patient encounter took 35 minutes today to perform the following: obtain history, perform exam, review outside records, interpret tests & imaging, counsel the patient on their diagnosis; and, document this encounter, including findings & plan in the electronic health record (EHR).   Allergies Katie Forehand, CNA; 08/11/2020 2:02 PM) Aspirin 81 *ANALGESICS - NonNarcotic* oxyCODONE-Ibuprofen NSAIDs Allergies Reconciled  Medication History Katie Forehand, CNA; 08/11/2020 2:02 PM) ALPRAZolam (0.5MG  Tablet, Oral) Active. Vitamin C (100MG  Tablet, Oral) Active. Carvedilol (25MG  Tablet, Oral) Active. Chlorthalidone (50MG  Tablet, Oral) Active. Hydrocodone-Acetaminophen (5-325MG  Tablet, Oral) Active. Tradjenta (5MG  Tablet, Oral) Active. Rosuvastatin Calcium (10MG  Tablet, Oral) Active. ProAir HFA (108 (90 Base)MCG/ACT Aerosol Soln, Inhalation) Active. Omeprazole (20MG  Capsule Katie, Oral) Active. MetFORMIN HCl ER (500MG  Tablet ER 24HR, Oral) Active. Losartan Potassium (50MG  Tablet, Oral) Active. Klor-Con M20 [facility] Tablet ER, Oral) Active. DULoxetine HCl (60MG  Capsule Katie Part, Oral) Active. Cyclobenzaprine HCl (10MG  Tablet, Oral) Active. Lyrica (75MG  Capsule, Oral) Active. Beclomethasone Diprop (Nasal) (42MCG/ACT Aerosol Soln, Nasal) Active. Co-Enzyme Q-10 (30MG  Capsule, Oral) Active. Cardizem (30MG  Tablet, Oral) Active. Benadryl Allergy (25MG  Tablet, Oral) Active. Lodine (400MG  Tablet, Oral) Active. Multi-Vitamin (Oral)  Active. 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 Enlarging. Most likely consistent with lipoma. Can try and excise at the time of surgery or at least rule out a hernia. Could remove the gallbladder from there as the extraction point.   CHRONIC CHOLECYSTITIS WITH CALCULUS (K80.10) Impression: Rather classic story of recurrent biliary colic with known gallstones. Most recent attack 3 months ago.  Agree with Katie. Lucia Gaskins that she would benefit from cholecystectomy. Reasonable laparoscopic approach. Don't know she will be get a single site candidate. Can start with that and see how things go.  Current Plans You are being scheduled for surgery- Our schedulers will call you.  You should hear from our office's scheduling department within 5 working days about the location, date, and time of surgery. We try to make accommodations for patient's preferences in scheduling surgery, but sometimes the OR schedule or the surgeon's schedule prevents [place] from making those accommodations.  If you have not heard from our office [phone]) in 5 working days, call the office and ask for your surgeon's nurse.  If you have other questions about your diagnosis, plan, or surgery, call the office and ask for your surgeon's nurse.  Written instructions provided Pt Education - Pamphlet Given - Laparoscopic Gallbladder Surgery: discussed with patient and provided information. The anatomy & physiology of hepatobiliary & pancreatic function was discussed. The pathophysiology of gallbladder dysfunction was  discussed. Natural history risks without surgery was discussed. I feel the risks of no intervention will lead to serious problems that outweigh the operative risks; therefore, I recommended cholecystectomy to remove the pathology. I explained laparoscopic techniques with possible need for an open approach. Probable cholangiogram to evaluate the bilary tract was explained as well.  Risks such as bleeding, infection, abscess, leak, injury to other organs, need for further treatment, heart attack, death, and other risks were discussed. I noted a good likelihood this will help address the problem. Possibility that this will not correct all abdominal symptoms was explained. Goals of post-operative recovery were discussed as well. We will work to minimize complications. An educational handout further explaining the pathology and treatment options was given as well. Questions were answered. The patient expresses understanding & wishes to proceed with surgery.  Pt Education - [facility] Laparosopic Post Op HCI (Ben Habermann) Pt Education - [facility] Good Bowel Health (Amyrah Pinkhasov) Pt Education - Laparoscopic Cholecystectomy: gallbladder  HISTORY OF CHRONIC ATRIAL FIBRILLATION (Z86.79)  Current Plans I recommended obtaining preoperative cardiac clearance. I am concerned about the health of the patient and the ability to tolerate the operation. Therefore, we will request clearance by cardiology to better assess operative risk & see if a reevaluation, further workup, etc is needed. Also recommendations on how medications such as for anticoagulation and blood pressure should be managed/held/restarted after surgery.  STATUS POST PLACEMENT OF IMPLANTABLE LOOP RECORDER (Z95.818)   OSA ON CPAP (G47.33)   OBESITY, MORBID, BMI 40.0-49.9 (E66.01)   DIABETES MELLITUS TYPE 2 IN OBESE (E11.69)   AMBULATES WITH CANE (Z99.89)  Katie Hector, MD, FACS, MASCRS Gastrointestinal and Minimally Invasive Surgery  [facility] [address] [place], [place] [address] [phone] Fax [phone] Main/Paging  CONTACT INFORMATION: Weekday (9AM-5PM) concerns: Call [facility] main office at  [phone] Weeknight (5PM-9AM) or Weekend/Holiday concerns: Check [email or web address] for General Surgery [facility] coverage (Please, do not use SecureChat as it is not reliable communication to operating surgeons for immediate patient care)

## 2020-08-16 ENCOUNTER — Telehealth: Payer: Self-pay | Admitting: *Deleted

## 2020-08-16 NOTE — Telephone Encounter (Signed)
   [facility] Pre-operative Risk Assessment    HEARTCARE STAFF: - Please ensure there is not already an duplicate clearance open for this procedure. - Under Visit Info/Reason for Call, type in Other and utilize the format Clearance MM/DD/YY or Clearance TBD. Do not use dashes or single digits. - If request is for dental extraction, please clarify the # of teeth to be extracted.  Request for surgical clearance:  1. What type of surgery is being performed? LAP CHOLE   2. When is this surgery scheduled? TBD   3. What type of clearance is required (medical clearance vs. Pharmacy clearance to hold med vs. Both)? MEDICAL  4. Are there any medications that need to be held prior to surgery and how long? NONE LISTED   5. Practice name and name of physician performing surgery? [facility]; DR. Remo Lipps [facility]   6. What is the office phone number? [phone]   7.   What is the office fax number? [place], CMA  8.   Anesthesia type (None, local, MAC, general) ? GENERAL   Julaine Hua 08/16/2020, 10:56 AM  _________________________________________________________________   (provider comments below)

## 2020-08-16 NOTE — Telephone Encounter (Signed)
   Primary Cardiologist: Thompson Grayer, MD  Chart reviewed as part of pre-operative protocol coverage. Because of Katie Owens past medical history and time since last visit, she will require a follow-up visit in order to better assess preoperative cardiovascular risk.  Pre-op covering staff: - Please schedule appointment and call patient to inform them. If patient already had an upcoming appointment within acceptable timeframe, please add "pre-op clearance" to the appointment notes so provider is aware. - Please contact requesting surgeon's office via preferred method (i.e, phone, fax) to inform them of need for appointment prior to surgery.   Richardson Dopp, PA-C  08/16/2020, 11:33 AM

## 2020-08-16 NOTE — Telephone Encounter (Signed)
Will send message to EP scheduler [place] to reach out to the pt with an appt.

## 2020-08-18 NOTE — Telephone Encounter (Signed)
Pt has been scheduled for pre op appt with Joesph July, PA 09/02/20. I will forward notes to PA for upcoming appt. I will send FYI to requesting office pt has appt 09/02/20.

## 2020-09-02 ENCOUNTER — Ambulatory Visit: Payer: Medicare PPO | Admitting: Student

## 2020-09-05 NOTE — Progress Notes (Signed)
Cardiology Office Note Date:  09/05/2020  Patient ID:  Katie Owens, Katie Owens Feb 27, 1951, MRN [id number] PCP:  Derinda Late, MD  Electrophysiologist:  Dr. [name]   Chief Complaint:  pre-op  History of Present Illness: Katie Owens is a 70 y.o. female with history of AFib s/p afib ablation at [facility] by Dr Clyda Hurdle in 2012, DM, fibromyalgia, HTN, HLD, OSA w/CPAP.   She saw Dr. [name] spring 2018, at that time discussed her symptoms of palpitations prompted ILR implant, to date of that visit, palpitations were attributed to PVCs, a brief ATach had been observed, no AFib.  The patient opted to stop Xarelto at that time given no AFib.  Understanding that if AF were to be noted by her ILR would need to resume. A  subsequent telephone note a pre-syncopal spell with brief ATach.   She was seen in the ER in April 2019 for a near syncopal event, no clear etiology was noted, symptoms resolved and discharged.   She reported she had drank more then usual Ice Tea and thinks the extra caffeine, shrimp in the food she had made her BP high and feel poorly, her PMD resumed her chlorthalidone and mentions he changed her other BP medicine to Irbesartan as well. I saw after this she felt like her heart rate was faster then usual the day prior, maybe slightly lightheaded, she was seated, and self resolved quickly.   She denied orthostatic symptoms.  She had not felt like she hash had any AFib.  This being a very specific and different symptoms then her "faster rates".  Denied CP or SOB.  No syncope. ILR had no observations, no arrhythmias.  She has not been seen again. Device clinic noted device had reached RRT, discussed with the patient, she preferred to keep the device in.  08/20/19: device clinic f/u note by Dr. [name] noted  Sinus with frequent ectopy/ disorganized atrial activity Continue to monitor without change at this time  I saw her 10/21/19 She mentions that for a couple months she just has no energy,  just does not feel like doing anything at all.  She suffers with depression and felt like this could be the cause and her medicines have been adjusted by her PMD.  Though has not gotten much improvement.  She has little exertional capacity,  gets winded with any increased exertional activities, even casual walking if it is of any distance, this is her baseline.  That being said, since she retired in 2017, spends much of her time home seated.  She does not exercise, never has.  She has stopped using her CPAP for no clear reason, just does not wear it.  She denies any kind of CP, no rest SOB, no dizzy spells near syncope or syncope, outside if the occasional fleeting lighteaded feeling when standing. She has occasional palpitations that are a feeling of a stop/missed beat and starts up, nothing persistent, long lasting.  She suspects these are her PVCs She has not felt like she has had any AFib  Her loop was RRT, she preferred to keep it in place if ot to be replaced with another. No device observation since the already evaluated episode on 08/05/19 described as disorganized atrial activity (69minute duration) 03/22/2018 the only other since her last programmer check and this was ST with PVCs, no true AFib in review of EGM I urged her to resume using her CPAP and follow up with her PMD regrding her suspicion of depression not  adequately managed.   Pending lap-chole, pending scheduling She has seen pulmonary and seems to be working on getting CPAP and sleep hygiene.  TODAY She is doing well. Does not exercise 2/2 back/sciatic pain, but last week started PT, doing stretching exercises and one strength exercise and feels like it is helping her already! She cares for her home, chores etc without exertional intolerances, lives in a condo, walks 2 flights of stairs to get to her apartment, does this usually 2x a day. No changes with her exertional capaity or unusual DOE, no CP. No dizzy spells, near syncope or  syncope.  She has infrequent awareness of a skip in her heart beat, this is a second or two, no palpitations otherwise, no symptoms of AFib   RCRI is zero (0.4%)    Device information: MDT ILR implanted 03/08/16, Dr. [name]  Past Medical History:  Diagnosis Date  . Arthritis   . Asthma   . Cancer [facility]) 1995   breast cancer lt with 12 nodes out  . Chronic kidney disease    hx stones  . Contact lens/glasses fitting    wears contacts or glasses  . Depression   . Diabetes mellitus without complication ([place])   . Dysrhythmia    hx AF  . Fibromyalgia   . GERD (gastroesophageal reflux disease)   . Hyperlipemia   . Hypertension   . IBS (irritable bowel syndrome)   . PONV (postoperative nausea and vomiting)   . Seasonal allergies   . Shortness of breath   . Sleep apnea    uses a cpap    Past Surgical History:  Procedure Laterality Date  . BREAST LUMPECTOMY WITH AXILLARY LYMPH NODE DISSECTION  1995   12 nodes removed  . CARDIAC CATHETERIZATION  01/24/2009   left heart  . CARDIAC ELECTROPHYSIOLOGY [place] AND ABLATION  2012  . CARPAL TUNNEL RELEASE Right 10/14/2012   Procedure: CARPAL TUNNEL RELEASE;  Surgeon: Wynonia Sours, MD;  Location: [facility];  Service: Orthopedics;  Laterality: Right;  . COLONOSCOPY    . DIAGNOSTIC LAPAROSCOPY  1990  . EP IMPLANTABLE DEVICE N/A 03/08/2016   Procedure: Loop Recorder Insertion;  Surgeon: Thompson Grayer, MD;  Location: [facility];  Service: Cardiovascular;  Laterality: N/A;  . LUMBAR FUSION    . TONSILLECTOMY      Current Outpatient Medications  Medication Sig Dispense Refill  . ALPRAZolam (XANAX) 0.5 MG tablet Take 0.5 mg by mouth at bedtime as needed for sleep.   1  . Ascorbic Acid (VITAMIN C) 1000 MG tablet Take 2,000 mg by mouth daily.     [name] b complex vitamins tablet Take 1 tablet by mouth daily.    . beclomethasone (QVAR) 80 MCG/ACT inhaler Inhale 1 puff into the lungs 2 (two) times daily.     . carvedilol  (COREG) 25 MG tablet Take 1 tablet (25 mg total) by mouth 2 (two) times daily. 180 tablet 3  . chlorthalidone (HYGROTON) 25 MG tablet Take 25 mg by mouth daily.    . cholecalciferol (VITAMIN D) 1000 UNITS tablet Take 2,000 Units by mouth daily.     [name] co-enzyme Q-10 30 MG capsule Take 30 mg by mouth daily.  (Patient not taking: Reported on 06/15/2020)    . cyclobenzaprine (FLEXERIL) 10 MG tablet Take 10 mg by mouth at bedtime.    [name] diltiazem (CARDIZEM) 30 MG tablet Take 1 tablet by mouth every 4 hours AS NEEDED for HR>100 and BP>100    .  diphenhydrAMINE (BENADRYL) 25 MG tablet Take 25 mg by mouth every 6 (six) hours as needed for allergies.    . DULoxetine (CYMBALTA) 60 MG capsule Take 1 capsule by mouth daily.    [name] HYDROcodone-acetaminophen (NORCO) 5-325 MG per tablet Take 1 tablet by mouth every 6 (six) hours as needed for pain. 30 tablet 0  . loperamide (IMODIUM) 2 MG capsule Take 2 mg by mouth every morning.    [name] losartan (COZAAR) 50 MG tablet Take 50 mg by mouth daily.    . Multiple Vitamins-Minerals (MULTIVITAMIN ADULT PO) Take 1 tablet by mouth daily.    [name] omeprazole (PRILOSEC) 20 MG capsule Take 20 mg by mouth daily.    [name] OZEMPIC, 0.25 OR 0.5 MG/DOSE, 2 MG/1.5ML SOPN Inject 0.5 mg as directed once a week.    . potassium chloride SA (K-DUR,KLOR-CON) 20 MEQ tablet Take 20 mEq by mouth daily.     . pregabalin (LYRICA) 75 MG capsule Take 75 mg by mouth 2 (two) times daily.    . rosuvastatin (CRESTOR) 10 MG tablet Take 10 mg by mouth daily.    [name] telmisartan (MICARDIS) 40 MG tablet Take 0.5 tablets by mouth daily.    . VENTOLIN HFA 108 (90 BASE) MCG/ACT inhaler Inhale 1-2 puffs into the lungs every 6 (six) hours as needed for wheezing or shortness of breath.   3  . vitamin B-12 (CYANOCOBALAMIN) 1000 MCG tablet Take 5,000 mcg by mouth daily.      No current facility-administered medications for this visit.    Allergies:   Penicillins, Aspirin, and Ibuprofen   Social History:  The patient   reports that she quit smoking about 40 years ago. Her smoking use included cigarettes. She has a 30.00 pack-year smoking history. She has never used smokeless tobacco. She reports that she does not drink alcohol and does not use drugs.   Family History:  The patient's family history includes Alcoholism in her brother, father, and mother; Anemia in her brother; Heart attack in her maternal grandmother; Heart disease in her brother; Hyperlipidemia in her brother and maternal grandmother; Hypertension in her brother, maternal grandmother, and mother; Other in her brother; Stroke in her mother.  ROS:  Please see the history of present illness.  All other systems are reviewed and otherwise negative.   PHYSICAL EXAM:  VS:  There were no vitals taken for this visit. BMI: There is no height or weight on file to calculate BMI. Well nourished, well developed, in no acute distress  HEENT: normocephalic, atraumatic  Neck: no JVD, carotid bruits or masses Cardiac: RRR; no significant murmurs, no rubs, or gallops, no extrasystoles heard Lungs:  CTA b/l, no wheezing, rhonchi or rales  Abd: soft, non-tender, obese MS: no deformity or atrophy Ext:  no edema  Skin: warm and dry, no rash Neuro:  No gross deficits appreciated Psych: euthymic mood, full affect  ILR site is stable, no tethering or discomfort   EKG:  Done today and reviewed by myself:  SR 100bpm, PVCs, no ST/T changes  ILR interrogation done today and reviewed by myself:  Unable to interrogate   09/11/10: Cardiac CT  Non-Cardiac Findings: Vascular calcification in the left breast. Apparent asymmetrically increasedglandular tissue in the left breast may be artifactual since a large lateral portion of the right breast is not included in the field of view. Mammography is suggested if the patient has not had a recent mammogram. Impressions: - Pulmonary vein anatomy as noted above - Mildly dilated left  atrium - Upper normal to mIldly  dilated pulmonary arteries - Breast findings as noted above   01/24/09: LHC Normal coronaries Normal LVEF No significant AS or MR  Recent Labs: No results found for requested labs within last 8760 hours.  No results found for requested labs within last 8760 hours.   CrCl cannot be calculated (Patient's most recent lab result is older than the maximum 21 days allowed.).   Wt Readings from Last 3 Encounters:  06/15/20 223 lb 12.8 oz (101.5 kg)  10/21/19 225 lb (102.1 kg)  11/21/17 225 lb (102.1 kg)     Other studies reviewed: Additional studies/records reviewed today include: summarized above  ASSESSMENT AND PLAN:  1. AFib      s/p AF ablation 2012     CHA2DS2Vasc is at least 4, off a/c post ablation w/o recurrent AF     Unable to interrogate her device is EOS     No symptoms of her Afib  2. HTN     Looks OK  3.  Palpitations, infrequent      Sound of PVCs      Seems we have seen her to have some PVCs at times before as well (loop)      Will check BMET. Mag level      Will get an echo      4. Pre-op     Low cardiac risk score     No symptoms to suggest any change clinically     She can walk 2 flights of stairs and does regularly without SOB or change in exertional capacity     No obejction to lap chole surgery, I don't think we need the echo done 1st, but will try.   Disposition: 53mo, sooner if needed.    Current medicines are reviewed at length with the patient today.  The patient did not have any concerns regarding medicines.  Venetia Night, PA-C 09/05/2020 4:55 PM     [place] [address] [phone] (office)  [phone] (fax)

## 2020-09-06 ENCOUNTER — Encounter: Payer: Self-pay | Admitting: Physician Assistant

## 2020-09-06 ENCOUNTER — Ambulatory Visit: Payer: Medicare PPO | Admitting: Physician Assistant

## 2020-09-06 ENCOUNTER — Other Ambulatory Visit: Payer: Self-pay

## 2020-09-06 VITALS — BP 136/62 | HR 100 | Ht 59.0 in | Wt 225.0 lb

## 2020-09-06 DIAGNOSIS — I480 Paroxysmal atrial fibrillation: Secondary | ICD-10-CM

## 2020-09-06 DIAGNOSIS — I493 Ventricular premature depolarization: Secondary | ICD-10-CM | POA: Diagnosis not present

## 2020-09-06 DIAGNOSIS — Z01818 Encounter for other preprocedural examination: Secondary | ICD-10-CM

## 2020-09-06 DIAGNOSIS — R002 Palpitations: Secondary | ICD-10-CM | POA: Diagnosis not present

## 2020-09-06 DIAGNOSIS — I10 Essential (primary) hypertension: Secondary | ICD-10-CM

## 2020-09-06 LAB — BASIC METABOLIC PANEL
BUN/Creatinine Ratio: 17 (ref 12–28)
BUN: 17 mg/dL (ref 8–27)
CO2: 19 mmol/L — ABNORMAL LOW (ref 20–29)
Calcium: 9.9 mg/dL (ref 8.7–10.3)
Chloride: 102 mmol/L (ref 96–106)
Creatinine, Ser: 1.01 mg/dL — ABNORMAL HIGH (ref 0.57–1.00)
Glucose: 175 mg/dL — ABNORMAL HIGH (ref 65–99)
Potassium: 4.6 mmol/L (ref 3.5–5.2)
Sodium: 136 mmol/L (ref 134–144)
eGFR: 60 mL/min/{1.73_m2} (ref >59)

## 2020-09-06 LAB — MAGNESIUM: Magnesium: 1.5 mg/dL — ABNORMAL LOW (ref 1.6–2.3)

## 2020-09-06 NOTE — Patient Instructions (Addendum)
Medication Instructions:   Your physician recommends that you continue on your current medications as directed. Please refer to the Current Medication list given to you today.  *If you need a refill on your cardiac medications before your next appointment, please call your pharmacy*   Lab Work: BMET AND [place]   If you have labs (blood work) drawn today and your tests are completely normal, you will receive your results only by: [name] MyChart Message (if you have MyChart) OR . A paper copy in the mail If you have any lab test that is abnormal or we need to change your treatment, we will call you to review the results.   Testing/Procedures: ASAP Your physician has requested that you have an echocardiogram. Echocardiography is a painless test that uses sound waves to create images of your heart. It provides your doctor with information about the size and shape of your heart and how well your heart's chambers and valves are working. This procedure takes approximately one hour. There are no restrictions for this procedure.   Follow-Up: At [facility], you and your health needs are our priority.  As part of our continuing mission to provide you with exceptional heart care, we have created designated Provider Care Teams.  These Care Teams include your primary Cardiologist (physician) and Advanced Practice Providers (APPs -  Physician Assistants and Nurse Practitioners) who all work together to provide you with the care you need, when you need it.  We recommend signing up for the patient portal called "MyChart".  Sign up information is provided on this After Visit Summary.  MyChart is used to connect with patients for Virtual Visits (Telemedicine).  Patients are able to view lab/test results, encounter notes, upcoming appointments, etc.  Non-urgent messages can be sent to your provider as well.   To learn more about what you can do with MyChart, go to [email or web address].    Your next  appointment:   6 month(s)  The format for your next appointment:   In Person  Provider:   Tommye Standard, PA-C   Other Instructions

## 2020-09-07 ENCOUNTER — Ambulatory Visit (HOSPITAL_COMMUNITY): Payer: Medicare PPO | Attending: Cardiology

## 2020-09-07 ENCOUNTER — Encounter (HOSPITAL_COMMUNITY): Payer: Self-pay | Admitting: Physician Assistant

## 2020-09-08 ENCOUNTER — Telehealth: Payer: Self-pay | Admitting: *Deleted

## 2020-09-08 NOTE — Telephone Encounter (Signed)
Lvm for patient to call back for results and reccomendations ?

## 2020-09-09 ENCOUNTER — Other Ambulatory Visit: Payer: Self-pay | Admitting: *Deleted

## 2020-09-09 DIAGNOSIS — Z79899 Other long term (current) drug therapy: Secondary | ICD-10-CM

## 2020-09-09 MED ORDER — MAGNESIUM OXIDE 400 MG PO TABS: 400.0000 mg | ORAL_TABLET | Freq: Every day | ORAL | 1 refills | Status: DC

## 2020-09-14 ENCOUNTER — Telehealth: Payer: Self-pay

## 2020-09-14 NOTE — Telephone Encounter (Signed)
Called pt to see if she has received CPAP yet, Pt stated she has not and will call office back to reschedule appt once she receives the machine. Nothing further needed.

## 2020-09-15 ENCOUNTER — Ambulatory Visit: Payer: Medicare PPO | Admitting: Adult Health

## 2020-09-19 ENCOUNTER — Other Ambulatory Visit: Payer: Self-pay | Admitting: *Deleted

## 2020-09-19 DIAGNOSIS — Z79899 Other long term (current) drug therapy: Secondary | ICD-10-CM

## 2020-09-19 MED ORDER — MAGNESIUM OXIDE 400 MG PO TABS: 200.0000 mg | ORAL_TABLET | ORAL | 1 refills | Status: DC

## 2020-09-20 ENCOUNTER — Other Ambulatory Visit: Payer: Medicare PPO

## 2020-09-29 ENCOUNTER — Other Ambulatory Visit: Payer: Medicare PPO

## 2020-10-03 ENCOUNTER — Other Ambulatory Visit: Payer: Self-pay

## 2020-10-03 ENCOUNTER — Ambulatory Visit (HOSPITAL_COMMUNITY): Payer: Medicare PPO | Attending: Internal Medicine

## 2020-10-03 ENCOUNTER — Other Ambulatory Visit: Payer: Medicare PPO | Admitting: *Deleted

## 2020-10-03 DIAGNOSIS — Z01810 Encounter for preprocedural cardiovascular examination: Secondary | ICD-10-CM | POA: Diagnosis not present

## 2020-10-03 DIAGNOSIS — E1122 Type 2 diabetes mellitus with diabetic chronic kidney disease: Secondary | ICD-10-CM | POA: Diagnosis not present

## 2020-10-03 DIAGNOSIS — G4730 Sleep apnea, unspecified: Secondary | ICD-10-CM | POA: Diagnosis not present

## 2020-10-03 DIAGNOSIS — R002 Palpitations: Secondary | ICD-10-CM

## 2020-10-03 DIAGNOSIS — Z79899 Other long term (current) drug therapy: Secondary | ICD-10-CM

## 2020-10-03 DIAGNOSIS — I129 Hypertensive chronic kidney disease with stage 1 through stage 4 chronic kidney disease, or unspecified chronic kidney disease: Secondary | ICD-10-CM | POA: Diagnosis not present

## 2020-10-03 DIAGNOSIS — R0602 Shortness of breath: Secondary | ICD-10-CM | POA: Diagnosis not present

## 2020-10-03 DIAGNOSIS — E785 Hyperlipidemia, unspecified: Secondary | ICD-10-CM | POA: Insufficient documentation

## 2020-10-03 DIAGNOSIS — Z01818 Encounter for other preprocedural examination: Secondary | ICD-10-CM | POA: Insufficient documentation

## 2020-10-03 DIAGNOSIS — E669 Obesity, unspecified: Secondary | ICD-10-CM | POA: Insufficient documentation

## 2020-10-03 DIAGNOSIS — N189 Chronic kidney disease, unspecified: Secondary | ICD-10-CM | POA: Insufficient documentation

## 2020-10-03 LAB — ECHOCARDIOGRAM COMPLETE
Area-P 1/2: 3.12 cm2
S' Lateral: 3.5 cm

## 2020-10-04 LAB — BASIC METABOLIC PANEL
BUN/Creatinine Ratio: 12 (ref 12–28)
BUN: 13 mg/dL (ref 8–27)
CO2: 18 mmol/L — ABNORMAL LOW (ref 20–29)
Calcium: 10.2 mg/dL (ref 8.7–10.3)
Chloride: 97 mmol/L (ref 96–106)
Creatinine, Ser: 1.08 mg/dL — ABNORMAL HIGH (ref 0.57–1.00)
Glucose: 237 mg/dL — ABNORMAL HIGH (ref 65–99)
Potassium: 4.6 mmol/L (ref 3.5–5.2)
Sodium: 138 mmol/L (ref 134–144)
eGFR: 55 mL/min/{1.73_m2} — ABNORMAL LOW (ref >59)

## 2020-10-04 LAB — MAGNESIUM: Magnesium: 1.6 mg/dL (ref 1.6–2.3)

## 2020-10-06 ENCOUNTER — Telehealth: Payer: Self-pay | Admitting: *Deleted

## 2020-10-06 NOTE — Telephone Encounter (Signed)
Lvm of patient results and clinic office contact number if any questions or concerns

## 2020-10-11 ENCOUNTER — Other Ambulatory Visit: Payer: Self-pay | Admitting: *Deleted

## 2020-10-11 DIAGNOSIS — I998 Other disorder of circulatory system: Secondary | ICD-10-CM

## 2020-10-21 NOTE — Addendum Note (Signed)
Addended by: Claude Manges on: 10/21/2020 02:36 PM   Modules accepted: Orders

## 2020-10-21 NOTE — Addendum Note (Signed)
Addended by: Claude Manges on: 10/21/2020 02:42 PM   Modules accepted: Orders

## 2020-11-22 ENCOUNTER — Other Ambulatory Visit: Payer: Self-pay | Admitting: Family Medicine

## 2020-11-22 LAB — SARS CORONAVIRUS 2 (TAT 6-24 HRS): SARS Coronavirus 2: NEGATIVE

## 2020-12-08 ENCOUNTER — Ambulatory Visit: Payer: Self-pay | Admitting: Surgery

## 2020-12-08 MED ORDER — GENTAMICIN SULFATE 40 MG/ML IJ SOLN: 5.0000 mg/kg | INTRAVENOUS | Status: AC

## 2020-12-08 MED ORDER — DEXTROSE 5 % IV SOLN: 900.0000 mg | INTRAVENOUS | Status: AC

## 2020-12-09 ENCOUNTER — Other Ambulatory Visit: Payer: Self-pay

## 2020-12-09 ENCOUNTER — Encounter (HOSPITAL_BASED_OUTPATIENT_CLINIC_OR_DEPARTMENT_OTHER): Payer: Self-pay | Admitting: Surgery

## 2020-12-09 DIAGNOSIS — E119 Type 2 diabetes mellitus without complications: Secondary | ICD-10-CM

## 2020-12-09 DIAGNOSIS — M1990 Unspecified osteoarthritis, unspecified site: Secondary | ICD-10-CM

## 2020-12-09 DIAGNOSIS — R0602 Shortness of breath: Secondary | ICD-10-CM

## 2020-12-09 DIAGNOSIS — Z973 Presence of spectacles and contact lenses: Secondary | ICD-10-CM

## 2020-12-09 DIAGNOSIS — M5430 Sciatica, unspecified side: Secondary | ICD-10-CM

## 2020-12-09 DIAGNOSIS — N189 Chronic kidney disease, unspecified: Secondary | ICD-10-CM

## 2020-12-09 DIAGNOSIS — R519 Headache, unspecified: Secondary | ICD-10-CM

## 2020-12-09 DIAGNOSIS — R222 Localized swelling, mass and lump, trunk: Secondary | ICD-10-CM

## 2020-12-09 DIAGNOSIS — K811 Chronic cholecystitis: Secondary | ICD-10-CM

## 2020-12-09 DIAGNOSIS — Z9989 Dependence on other enabling machines and devices: Secondary | ICD-10-CM

## 2020-12-09 HISTORY — DX: Presence of spectacles and contact lenses: Z97.3

## 2020-12-09 HISTORY — DX: Headache, unspecified: R51.9

## 2020-12-09 HISTORY — DX: Dependence on other enabling machines and devices: Z99.89

## 2020-12-09 HISTORY — DX: Unspecified osteoarthritis, unspecified site: M19.90

## 2020-12-09 HISTORY — DX: Localized swelling, mass and lump, trunk: R22.2

## 2020-12-09 HISTORY — DX: Shortness of breath: R06.02

## 2020-12-09 HISTORY — DX: Chronic cholecystitis: K81.1

## 2020-12-09 HISTORY — DX: Chronic kidney disease, unspecified: N18.9

## 2020-12-09 HISTORY — DX: Sciatica, unspecified side: M54.30

## 2020-12-09 HISTORY — DX: Type 2 diabetes mellitus without complications: E11.9

## 2020-12-09 NOTE — Progress Notes (Addendum)
Spoke w/ via phone for pre-op interview---pt Lab needs dos---- none              Lab results------has lab appt 12-12-2020 for cbc, bmp at 1145 am COVID test ----12-12-2020 1430 (overnight stay) Arrive at -------545 am 12-15-2020 NPO after MN NO Solid Food.  Drink ensure G2 drink at 445 am,  Clear liquids from MN until---445 am then npo Med rec completed Medications to take morning of surgery -----use qvar inhaler and use ventolin inhaler prn and bring inhalers with you, diltiazem prn, duloxetine, lyrica, carvedilol, oemprazole. Diabetic medication -----none day of surgery Patient instructed no nail polish to be worn day of surgery Patient instructed to bring photo id and insurance card day of surgery Patient aware to have Driver (ride ) / caregiver  vivian friendlutein will drop pt off and pick up after overnight stay   for 24 hours after surgery  Patient Special Instructions -----pt given overnight stay instructions Pre-Op special Istructions -----none Patient verbalized understanding of instructions that were given at this phone interview. Patient denies shortness of breath, chest pain, fever, cough at this phone interview.   Anesthesia Review:hx afib, ablation 2012, htn occ pv's, osa severe (cpap broken, waiting on new cpap), asthma, sob with exertion on occasion, dm type 2, ckd stage 3, left breast cancer (s/p mastectomy with lymph node dissection and chemo and radiation 2012., pt has loop recorder with dead battery, no longer using. Pt denies cardiac S & s, or sob at pre op call  PCP: dr Collier Salina blomgren Electrophysiologist: dr allred cardiac clearance note renee ursuy pac 09-05-2020 chart/epic for 12-15-2020 surgery Chest x-ray :04-01-2019 epic EKG :09-06-2020 chart/epic Echo :10-03-2020 chart/epic Stress test:none Cardiac Cath : none Activity level: uses  walker for limited distance, does own housework can climb  2 flights  of steps without problems. Sleep Study/ CPAP :sever osa per pt cpap  broken Fasting Blood Sugar :  125    / Checks Blood Sugar --q am [facility] endocrinology dr Rodman Key garris 10-31-2020 [facility] Blood Thinner/ Instructions Maryjane Hurter Dose:n/a ASA / Instructions/ Last Dose :  n/a  Pt will need wheelchair, does not have her walker.

## 2020-12-12 ENCOUNTER — Encounter (HOSPITAL_COMMUNITY)
Admission: RE | Admit: 2020-12-12 | Discharge: 2020-12-12 | Disposition: A | Discharge status: Home / Self Care | Payer: Medicare PPO | Source: Ambulatory Visit | Attending: Surgery | Admitting: Surgery

## 2020-12-12 ENCOUNTER — Other Ambulatory Visit: Payer: Self-pay

## 2020-12-12 ENCOUNTER — Other Ambulatory Visit (HOSPITAL_COMMUNITY)
Admission: RE | Admit: 2020-12-12 | Discharge: 2020-12-12 | Disposition: A | Discharge status: Home / Self Care | Payer: Medicare PPO | Source: Ambulatory Visit | Attending: Surgery | Admitting: Surgery

## 2020-12-12 DIAGNOSIS — Z01812 Encounter for preprocedural laboratory examination: Secondary | ICD-10-CM | POA: Insufficient documentation

## 2020-12-12 DIAGNOSIS — Z20822 Contact with and (suspected) exposure to covid-19: Secondary | ICD-10-CM | POA: Diagnosis not present

## 2020-12-12 LAB — BASIC METABOLIC PANEL
Anion gap: 10 (ref 5–15)
BUN: 28 mg/dL — ABNORMAL HIGH (ref 8–23)
CO2: 23 mmol/L (ref 22–32)
Calcium: 9.8 mg/dL (ref 8.9–10.3)
Chloride: 104 mmol/L (ref 98–111)
Creatinine, Ser: 1.19 mg/dL — ABNORMAL HIGH (ref 0.44–1.00)
GFR, Estimated: 49 mL/min — ABNORMAL LOW (ref >60)
Glucose, Bld: 154 mg/dL — ABNORMAL HIGH (ref 70–99)
Potassium: 4.1 mmol/L (ref 3.5–5.1)
Sodium: 137 mmol/L (ref 135–145)

## 2020-12-12 LAB — SARS CORONAVIRUS 2 (TAT 6-24 HRS): SARS Coronavirus 2: NEGATIVE

## 2020-12-12 LAB — CBC
HCT: 42.1 % (ref 36.0–46.0)
Hemoglobin: 13.7 g/dL (ref 12.0–15.0)
MCH: 29.3 pg (ref 26.0–34.0)
MCHC: 32.5 g/dL (ref 30.0–36.0)
MCV: 90.1 fL (ref 80.0–100.0)
Platelets: 170 10*3/uL (ref 150–400)
RBC: 4.67 MIL/uL (ref 3.87–5.11)
RDW: 13.2 % (ref 11.5–15.5)
WBC: 6.9 10*3/uL (ref 4.0–10.5)
nRBC: 0 % (ref 0.0–0.2)

## 2020-12-14 NOTE — Anesthesia Preprocedure Evaluation (Addendum)
Anesthesia Evaluation  Patient identified by MRN, date of birth, ID band Patient awake    Reviewed: Allergy & Precautions, NPO status , Patient's Chart, lab work & pertinent test results  History of Anesthesia Complications (+) PONV and history of anesthetic complications  Airway Mallampati: II  TM Distance: >3 FB Neck ROM: Full    Dental no notable dental hx. (+) Dental Advisory Given   Pulmonary asthma , sleep apnea and Continuous Positive Airway Pressure Ventilation , former smoker,    Pulmonary exam normal        Cardiovascular hypertension, Pt. on medications and Pt. on home beta blockers Normal cardiovascular exam  IMPRESSIONS    1. Left ventricular ejection fraction, by estimation, is 50 to 55%. The  left ventricle has low normal function. The left ventricle has no regional  wall motion abnormalities. Left ventricular diastolic parameters are  indeterminate.  2. Right ventricular systolic function is normal. The right ventricular  size is normal. Mildly increased right ventricular wall thickness.  3. The mitral valve is normal in structure. Trivial mitral valve  regurgitation.  4. The aortic valve is normal in structure. Aortic valve regurgitation is  not visualized.  5. The inferior vena cava is normal in size with greater than 50%  respiratory variability, suggesting right atrial pressure of 3 mmHg.    Neuro/Psych PSYCHIATRIC DISORDERS Depression negative neurological ROS     GI/Hepatic Neg liver ROS, GERD  ,  Endo/Other  diabetesMorbid obesity  Renal/GU Renal disease     Musculoskeletal  (+) Fibromyalgia -, narcotic dependent  Abdominal   Peds  Hematology negative hematology ROS (+)   Anesthesia Other Findings   Reproductive/Obstetrics                            Anesthesia Physical Anesthesia Plan  ASA: 3  Anesthesia Plan: General   Post-op Pain Management:     Induction: Intravenous  PONV Risk Score and Plan: 4 or greater and Ondansetron, Dexamethasone, Midazolam and Scopolamine patch - Pre-op  Airway Management Planned: Oral ETT  Additional Equipment:   Intra-op Plan:   Post-operative Plan: Extubation in OR  Informed Consent: I have reviewed the patients History and Physical, chart, labs and discussed the procedure including the risks, benefits and alternatives for the proposed anesthesia with the patient or authorized representative who has indicated his/her understanding and acceptance.     Dental advisory given  Plan Discussed with: Anesthesiologist and CRNA  Anesthesia Plan Comments:        Anesthesia Quick Evaluation

## 2020-12-15 ENCOUNTER — Ambulatory Visit (HOSPITAL_COMMUNITY): Payer: Medicare PPO

## 2020-12-15 ENCOUNTER — Ambulatory Visit (HOSPITAL_BASED_OUTPATIENT_CLINIC_OR_DEPARTMENT_OTHER): Payer: Medicare PPO | Admitting: Anesthesiology

## 2020-12-15 ENCOUNTER — Other Ambulatory Visit: Payer: Self-pay

## 2020-12-15 ENCOUNTER — Encounter (HOSPITAL_COMMUNITY)
Admission: RE | Disposition: A | Discharge status: Home / Self Care | Payer: Self-pay | Source: Ambulatory Visit | Attending: Surgery

## 2020-12-15 ENCOUNTER — Encounter (HOSPITAL_BASED_OUTPATIENT_CLINIC_OR_DEPARTMENT_OTHER): Payer: Self-pay | Admitting: Surgery

## 2020-12-15 ENCOUNTER — Observation Stay (HOSPITAL_COMMUNITY)
Admission: RE | Admit: 2020-12-15 | Discharge: 2020-12-16 | Disposition: A | Discharge status: Home / Self Care | Payer: Medicare PPO | Source: Ambulatory Visit | Attending: Surgery | Admitting: Surgery

## 2020-12-15 DIAGNOSIS — Z7984 Long term (current) use of oral hypoglycemic drugs: Secondary | ICD-10-CM | POA: Insufficient documentation

## 2020-12-15 DIAGNOSIS — K7581 Nonalcoholic steatohepatitis (NASH): Secondary | ICD-10-CM | POA: Diagnosis not present

## 2020-12-15 DIAGNOSIS — K7400 Hepatic fibrosis, unspecified: Secondary | ICD-10-CM | POA: Insufficient documentation

## 2020-12-15 DIAGNOSIS — E65 Localized adiposity: Secondary | ICD-10-CM | POA: Diagnosis not present

## 2020-12-15 DIAGNOSIS — Z79899 Other long term (current) drug therapy: Secondary | ICD-10-CM | POA: Insufficient documentation

## 2020-12-15 DIAGNOSIS — Z853 Personal history of malignant neoplasm of breast: Secondary | ICD-10-CM | POA: Diagnosis not present

## 2020-12-15 DIAGNOSIS — K8010 Calculus of gallbladder with chronic cholecystitis without obstruction: Secondary | ICD-10-CM | POA: Diagnosis not present

## 2020-12-15 DIAGNOSIS — R0902 Hypoxemia: Secondary | ICD-10-CM | POA: Diagnosis not present

## 2020-12-15 DIAGNOSIS — K760 Fatty (change of) liver, not elsewhere classified: Secondary | ICD-10-CM

## 2020-12-15 DIAGNOSIS — I10 Essential (primary) hypertension: Secondary | ICD-10-CM | POA: Diagnosis not present

## 2020-12-15 DIAGNOSIS — R002 Palpitations: Secondary | ICD-10-CM

## 2020-12-15 DIAGNOSIS — K8020 Calculus of gallbladder without cholecystitis without obstruction: Secondary | ICD-10-CM | POA: Diagnosis present

## 2020-12-15 DIAGNOSIS — G4733 Obstructive sleep apnea (adult) (pediatric): Secondary | ICD-10-CM | POA: Diagnosis present

## 2020-12-15 DIAGNOSIS — E119 Type 2 diabetes mellitus without complications: Secondary | ICD-10-CM | POA: Insufficient documentation

## 2020-12-15 DIAGNOSIS — K819 Cholecystitis, unspecified: Secondary | ICD-10-CM

## 2020-12-15 HISTORY — PX: CHOLECYSTECTOMY: SHX55

## 2020-12-15 HISTORY — PX: LIVER BIOPSY: SHX301

## 2020-12-15 HISTORY — DX: Personal history of urinary calculi: Z87.442

## 2020-12-15 HISTORY — PX: MASS EXCISION: SHX2000

## 2020-12-15 HISTORY — DX: Fatty (change of) liver, not elsewhere classified: K76.0

## 2020-12-15 HISTORY — DX: COVID-19: U07.1

## 2020-12-15 LAB — GLUCOSE, CAPILLARY
Glucose-Capillary: 160 mg/dL — ABNORMAL HIGH (ref 70–99)
Glucose-Capillary: 244 mg/dL — ABNORMAL HIGH (ref 70–99)

## 2020-12-15 SURGERY — LAPAROSCOPIC CHOLECYSTECTOMY WITH INTRAOPERATIVE CHOLANGIOGRAM
Anesthesia: General | Site: Abdomen

## 2020-12-15 MED ORDER — FENTANYL CITRATE (PF) 100 MCG/2ML IJ SOLN
INTRAMUSCULAR | Status: AC
  Filled 2020-12-15: qty 2

## 2020-12-15 MED ORDER — HYDROCODONE-ACETAMINOPHEN 5-325 MG PO TABS
1.0000 | ORAL_TABLET | Freq: Four times a day (QID) | ORAL | Status: DC | PRN
Start: 2020-12-15 — End: 2020-12-16
  Administered 2020-12-16: 1 via ORAL
  Filled 2020-12-15: qty 1

## 2020-12-15 MED ORDER — GABAPENTIN 300 MG PO CAPS: 300.0000 mg | ORAL_CAPSULE | ORAL | Status: DC

## 2020-12-15 MED ORDER — ROCURONIUM BROMIDE 10 MG/ML (PF) SYRINGE
PREFILLED_SYRINGE | INTRAVENOUS | Status: AC
  Filled 2020-12-15: qty 10

## 2020-12-15 MED ORDER — ACETAMINOPHEN 500 MG PO TABS
1000.0000 mg | ORAL_TABLET | ORAL | Status: AC
  Administered 2020-12-15: 1000 mg via ORAL

## 2020-12-15 MED ORDER — POTASSIUM CHLORIDE CRYS ER 20 MEQ PO TBCR
20.0000 meq | EXTENDED_RELEASE_TABLET | Freq: Every day | ORAL | Status: DC
  Administered 2020-12-15 – 2020-12-16 (×2): 20 meq via ORAL
  Filled 2020-12-15 (×2): qty 1

## 2020-12-15 MED ORDER — METOPROLOL TARTRATE 5 MG/5ML IV SOLN: 5.0000 mg | Freq: Four times a day (QID) | INTRAVENOUS | Status: DC | PRN

## 2020-12-15 MED ORDER — SCOPOLAMINE 1 MG/3DAYS TD PT72
MEDICATED_PATCH | TRANSDERMAL | Status: AC
  Filled 2020-12-15: qty 1

## 2020-12-15 MED ORDER — ENOXAPARIN SODIUM 40 MG/0.4ML IJ SOSY
40.0000 mg | PREFILLED_SYRINGE | INTRAMUSCULAR | Status: DC
  Administered 2020-12-16: 40 mg via SUBCUTANEOUS
  Filled 2020-12-15: qty 0.4

## 2020-12-15 MED ORDER — ROSUVASTATIN CALCIUM 10 MG PO TABS
10.0000 mg | ORAL_TABLET | Freq: Every day | ORAL | Status: DC
  Administered 2020-12-15: 10 mg via ORAL
  Filled 2020-12-15: qty 1

## 2020-12-15 MED ORDER — ENSURE PRE-SURGERY PO LIQD: 296.0000 mL | Freq: Once | ORAL | Status: DC

## 2020-12-15 MED ORDER — CARVEDILOL 25 MG PO TABS
25.0000 mg | ORAL_TABLET | Freq: Two times a day (BID) | ORAL | Status: DC
  Administered 2020-12-15 – 2020-12-16 (×2): 25 mg via ORAL
  Filled 2020-12-15 (×2): qty 1

## 2020-12-15 MED ORDER — HYDROMORPHONE HCL 1 MG/ML IJ SOLN
INTRAMUSCULAR | Status: AC
  Filled 2020-12-15: qty 1

## 2020-12-15 MED ORDER — CHLORHEXIDINE GLUCONATE CLOTH 2 % EX PADS: 6.0000 | MEDICATED_PAD | Freq: Once | CUTANEOUS | Status: DC

## 2020-12-15 MED ORDER — BISACODYL 10 MG RE SUPP: 10.0000 mg | Freq: Every day | RECTAL | Status: DC | PRN

## 2020-12-15 MED ORDER — PROPOFOL 10 MG/ML IV BOLUS
INTRAVENOUS | Status: AC
  Filled 2020-12-15: qty 40

## 2020-12-15 MED ORDER — SCOPOLAMINE 1 MG/3DAYS TD PT72
1.0000 | MEDICATED_PATCH | TRANSDERMAL | Status: DC
  Administered 2020-12-15: 1.5 mg via TRANSDERMAL

## 2020-12-15 MED ORDER — B COMPLEX PO TABS: 1.0000 | ORAL_TABLET | Freq: Every day | ORAL | Status: DC

## 2020-12-15 MED ORDER — FUROSEMIDE 10 MG/ML IJ SOLN
40.0000 mg | Freq: Once | INTRAMUSCULAR | Status: AC
  Administered 2020-12-15: 40 mg via INTRAVENOUS
  Filled 2020-12-15: qty 4

## 2020-12-15 MED ORDER — ACETAMINOPHEN 650 MG RE SUPP: 650.0000 mg | Freq: Four times a day (QID) | RECTAL | Status: DC | PRN

## 2020-12-15 MED ORDER — METHOCARBAMOL 500 MG PO TABS: 500.0000 mg | ORAL_TABLET | Freq: Four times a day (QID) | ORAL | Status: DC | PRN

## 2020-12-15 MED ORDER — GENTAMICIN SULFATE 40 MG/ML IJ SOLN
330.0000 mg | INTRAVENOUS | Status: AC
  Administered 2020-12-15: 330 mg via INTRAVENOUS
  Filled 2020-12-15: qty 8.25

## 2020-12-15 MED ORDER — VITAMIN B-12 1000 MCG PO TABS
5000.0000 ug | ORAL_TABLET | Freq: Every day | ORAL | Status: DC
  Administered 2020-12-16: 5000 ug via ORAL
  Filled 2020-12-15: qty 5

## 2020-12-15 MED ORDER — METHOCARBAMOL 1000 MG/10ML IJ SOLN
1000.0000 mg | Freq: Four times a day (QID) | INTRAVENOUS | Status: DC | PRN
  Filled 2020-12-15: qty 10

## 2020-12-15 MED ORDER — DULOXETINE HCL 60 MG PO CPEP
60.0000 mg | ORAL_CAPSULE | Freq: Every day | ORAL | Status: DC
  Administered 2020-12-16: 60 mg via ORAL
  Filled 2020-12-15: qty 1

## 2020-12-15 MED ORDER — EPHEDRINE 5 MG/ML INJ
INTRAVENOUS | Status: AC
  Filled 2020-12-15: qty 10

## 2020-12-15 MED ORDER — MAGIC MOUTHWASH
15.0000 mL | Freq: Four times a day (QID) | ORAL | Status: DC | PRN
  Filled 2020-12-15: qty 15

## 2020-12-15 MED ORDER — CELECOXIB 200 MG PO CAPS
200.0000 mg | ORAL_CAPSULE | Freq: Two times a day (BID) | ORAL | Status: DC
  Administered 2020-12-15 – 2020-12-16 (×2): 200 mg via ORAL
  Filled 2020-12-15 (×2): qty 1

## 2020-12-15 MED ORDER — LIDOCAINE 2% (20 MG/ML) 5 ML SYRINGE
INTRAMUSCULAR | Status: DC | PRN
  Administered 2020-12-15: 100 mg via INTRAVENOUS

## 2020-12-15 MED ORDER — ACETAMINOPHEN 500 MG PO TABS
1000.0000 mg | ORAL_TABLET | Freq: Once | ORAL | Status: AC
  Administered 2020-12-15: 1000 mg via ORAL
  Filled 2020-12-15: qty 2

## 2020-12-15 MED ORDER — BECLOMETHASONE DIPROPIONATE 80 MCG/ACT IN AERS: 1.0000 | INHALATION_SPRAY | Freq: Two times a day (BID) | RESPIRATORY_TRACT | Status: DC

## 2020-12-15 MED ORDER — SODIUM CHLORIDE 0.9 % IV SOLN: Freq: Three times a day (TID) | INTRAVENOUS | Status: DC | PRN

## 2020-12-15 MED ORDER — BUPIVACAINE LIPOSOME 1.3 % IJ SUSP
INTRAMUSCULAR | Status: DC | PRN
  Administered 2020-12-15: 20 mL

## 2020-12-15 MED ORDER — PHENYLEPHRINE 40 MCG/ML (10ML) SYRINGE FOR IV PUSH (FOR BLOOD PRESSURE SUPPORT)
PREFILLED_SYRINGE | INTRAVENOUS | Status: DC | PRN
  Administered 2020-12-15 (×3): 80 ug via INTRAVENOUS

## 2020-12-15 MED ORDER — ALBUTEROL SULFATE (2.5 MG/3ML) 0.083% IN NEBU: 2.5000 mg | INHALATION_SOLUTION | Freq: Four times a day (QID) | RESPIRATORY_TRACT | Status: DC | PRN

## 2020-12-15 MED ORDER — SODIUM CHLORIDE 0.9 % IR SOLN
Status: DC | PRN
  Administered 2020-12-15: 1000 mL

## 2020-12-15 MED ORDER — SUGAMMADEX SODIUM 200 MG/2ML IV SOLN
INTRAVENOUS | Status: DC | PRN
  Administered 2020-12-15: 200 mg via INTRAVENOUS

## 2020-12-15 MED ORDER — PREGABALIN 75 MG PO CAPS
75.0000 mg | ORAL_CAPSULE | Freq: Two times a day (BID) | ORAL | Status: DC
  Administered 2020-12-15 – 2020-12-16 (×2): 75 mg via ORAL
  Filled 2020-12-15 (×2): qty 1

## 2020-12-15 MED ORDER — VITAMIN D 25 MCG (1000 UNIT) PO TABS
2000.0000 [IU] | ORAL_TABLET | Freq: Every day | ORAL | Status: DC
  Administered 2020-12-16: 2000 [IU] via ORAL
  Filled 2020-12-15: qty 2

## 2020-12-15 MED ORDER — FENTANYL CITRATE (PF) 100 MCG/2ML IJ SOLN
25.0000 ug | INTRAMUSCULAR | Status: DC | PRN
  Administered 2020-12-15 (×2): 50 ug via INTRAVENOUS

## 2020-12-15 MED ORDER — BUDESONIDE 0.25 MG/2ML IN SUSP
0.2500 mg | Freq: Two times a day (BID) | RESPIRATORY_TRACT | Status: DC
  Filled 2020-12-15: qty 2

## 2020-12-15 MED ORDER — ONDANSETRON HCL 4 MG/2ML IJ SOLN
INTRAMUSCULAR | Status: DC | PRN
  Administered 2020-12-15: 4 mg via INTRAVENOUS

## 2020-12-15 MED ORDER — SUMATRIPTAN SUCCINATE 50 MG PO TABS
100.0000 mg | ORAL_TABLET | ORAL | Status: DC | PRN
  Filled 2020-12-15: qty 2

## 2020-12-15 MED ORDER — METHOCARBAMOL 500 MG PO TABS: 1000.0000 mg | ORAL_TABLET | Freq: Four times a day (QID) | ORAL | Status: DC | PRN

## 2020-12-15 MED ORDER — SODIUM CHLORIDE 0.9% FLUSH
3.0000 mL | Freq: Two times a day (BID) | INTRAVENOUS | Status: DC
  Administered 2020-12-15 – 2020-12-16 (×3): 3 mL via INTRAVENOUS

## 2020-12-15 MED ORDER — VITAMIN D 25 MCG (1000 UNIT) PO TABS: 2000.0000 [IU] | ORAL_TABLET | Freq: Every day | ORAL | Status: DC

## 2020-12-15 MED ORDER — LIDOCAINE HCL (PF) 2 % IJ SOLN
INTRAMUSCULAR | Status: AC
  Filled 2020-12-15: qty 5

## 2020-12-15 MED ORDER — PHENYLEPHRINE 40 MCG/ML (10ML) SYRINGE FOR IV PUSH (FOR BLOOD PRESSURE SUPPORT)
PREFILLED_SYRINGE | INTRAVENOUS | Status: AC
  Filled 2020-12-15: qty 10

## 2020-12-15 MED ORDER — GABAPENTIN 300 MG PO CAPS
ORAL_CAPSULE | ORAL | Status: AC
  Filled 2020-12-15: qty 1

## 2020-12-15 MED ORDER — AMISULPRIDE (ANTIEMETIC) 5 MG/2ML IV SOLN
10.0000 mg | Freq: Once | INTRAVENOUS | Status: AC | PRN
Start: 2020-12-15 — End: 2020-12-15
  Administered 2020-12-15: 10 mg via INTRAVENOUS

## 2020-12-15 MED ORDER — IOHEXOL 300 MG/ML  SOLN
INTRAMUSCULAR | Status: DC | PRN
  Administered 2020-12-15: 9 mL

## 2020-12-15 MED ORDER — DEXTROSE 5 % IV SOLN: INTRAVENOUS | Status: DC | PRN

## 2020-12-15 MED ORDER — TRAMADOL HCL 50 MG PO TABS
50.0000 mg | ORAL_TABLET | Freq: Four times a day (QID) | ORAL | Status: DC | PRN
  Administered 2020-12-16: 50 mg via ORAL
  Filled 2020-12-15: qty 1

## 2020-12-15 MED ORDER — ALBUMIN HUMAN 5 % IV SOLN
12.5000 g | Freq: Four times a day (QID) | INTRAVENOUS | Status: DC | PRN
  Filled 2020-12-15: qty 250

## 2020-12-15 MED ORDER — BUPIVACAINE LIPOSOME 1.3 % IJ SUSP: 20.0000 mL | Freq: Once | INTRAMUSCULAR | Status: DC

## 2020-12-15 MED ORDER — BUPIVACAINE-EPINEPHRINE 0.25% -1:200000 IJ SOLN
INTRAMUSCULAR | Status: DC | PRN
  Administered 2020-12-15: 50 mL

## 2020-12-15 MED ORDER — ONDANSETRON HCL 4 MG/2ML IJ SOLN
INTRAMUSCULAR | Status: AC
  Filled 2020-12-15: qty 2

## 2020-12-15 MED ORDER — SODIUM CHLORIDE 0.9 % IV SOLN: INTRAVENOUS | Status: DC

## 2020-12-15 MED ORDER — CHLORTHALIDONE 25 MG PO TABS
25.0000 mg | ORAL_TABLET | Freq: Every day | ORAL | Status: DC
  Administered 2020-12-15 – 2020-12-16 (×2): 25 mg via ORAL
  Filled 2020-12-15 (×3): qty 1

## 2020-12-15 MED ORDER — LIP MEDEX EX OINT
1.0000 "application " | TOPICAL_OINTMENT | Freq: Two times a day (BID) | CUTANEOUS | Status: DC
  Administered 2020-12-15 – 2020-12-16 (×3): 1 via TOPICAL
  Filled 2020-12-15 (×2): qty 7

## 2020-12-15 MED ORDER — AMISULPRIDE (ANTIEMETIC) 5 MG/2ML IV SOLN
INTRAVENOUS | Status: AC
  Filled 2020-12-15: qty 4

## 2020-12-15 MED ORDER — PROPOFOL 10 MG/ML IV BOLUS
INTRAVENOUS | Status: DC | PRN
  Administered 2020-12-15: 170 mg via INTRAVENOUS

## 2020-12-15 MED ORDER — PANTOPRAZOLE SODIUM 40 MG PO TBEC
80.0000 mg | DELAYED_RELEASE_TABLET | Freq: Every day | ORAL | Status: DC
  Administered 2020-12-16: 80 mg via ORAL
  Filled 2020-12-15: qty 2

## 2020-12-15 MED ORDER — SEMAGLUTIDE(0.25 OR 0.5MG/DOS) 2 MG/1.5ML ~~LOC~~ SOPN: 1.0000 mg | PEN_INJECTOR | SUBCUTANEOUS | Status: DC

## 2020-12-15 MED ORDER — POLYETHYLENE GLYCOL 3350 17 G PO PACK: 17.0000 g | PACK | Freq: Two times a day (BID) | ORAL | Status: DC | PRN

## 2020-12-15 MED ORDER — CYCLOBENZAPRINE HCL 10 MG PO TABS
10.0000 mg | ORAL_TABLET | Freq: Every day | ORAL | Status: DC
  Administered 2020-12-15: 10 mg via ORAL
  Filled 2020-12-15: qty 1

## 2020-12-15 MED ORDER — SODIUM CHLORIDE 0.9 % IV SOLN: 250.0000 mL | INTRAVENOUS | Status: DC | PRN

## 2020-12-15 MED ORDER — ROCURONIUM BROMIDE 10 MG/ML (PF) SYRINGE
PREFILLED_SYRINGE | INTRAVENOUS | Status: DC | PRN
  Administered 2020-12-15: 80 mg via INTRAVENOUS

## 2020-12-15 MED ORDER — MAGNESIUM OXIDE 400 MG PO TABS
200.0000 mg | ORAL_TABLET | ORAL | Status: DC
  Filled 2020-12-15: qty 0.5

## 2020-12-15 MED ORDER — CALCIUM POLYCARBOPHIL 625 MG PO TABS
625.0000 mg | ORAL_TABLET | Freq: Two times a day (BID) | ORAL | Status: DC
  Administered 2020-12-15 – 2020-12-16 (×2): 625 mg via ORAL
  Filled 2020-12-15 (×2): qty 1

## 2020-12-15 MED ORDER — MIDAZOLAM HCL 2 MG/2ML IJ SOLN
INTRAMUSCULAR | Status: AC
  Filled 2020-12-15: qty 2

## 2020-12-15 MED ORDER — ACETAMINOPHEN 325 MG PO TABS: 325.0000 mg | ORAL_TABLET | Freq: Four times a day (QID) | ORAL | Status: DC | PRN

## 2020-12-15 MED ORDER — DIPHENHYDRAMINE HCL 25 MG PO TABS
25.0000 mg | ORAL_TABLET | Freq: Four times a day (QID) | ORAL | Status: DC | PRN
  Filled 2020-12-15: qty 1

## 2020-12-15 MED ORDER — SODIUM CHLORIDE 0.9% FLUSH: 3.0000 mL | INTRAVENOUS | Status: DC | PRN

## 2020-12-15 MED ORDER — ACETAMINOPHEN 500 MG PO TABS
ORAL_TABLET | ORAL | Status: AC
  Filled 2020-12-15: qty 2

## 2020-12-15 MED ORDER — DEXAMETHASONE SODIUM PHOSPHATE 10 MG/ML IJ SOLN
INTRAMUSCULAR | Status: DC | PRN
  Administered 2020-12-15: 5 mg via INTRAVENOUS

## 2020-12-15 MED ORDER — MAGNESIUM HYDROXIDE 400 MG/5ML PO SUSP: 30.0000 mL | Freq: Every day | ORAL | Status: DC | PRN

## 2020-12-15 MED ORDER — ALBUTEROL SULFATE HFA 108 (90 BASE) MCG/ACT IN AERS
1.0000 | INHALATION_SPRAY | Freq: Four times a day (QID) | RESPIRATORY_TRACT | Status: DC | PRN
Start: 2020-12-15 — End: 2020-12-15

## 2020-12-15 MED ORDER — PROMETHAZINE HCL 25 MG/ML IJ SOLN: 6.2500 mg | INTRAMUSCULAR | Status: DC | PRN

## 2020-12-15 MED ORDER — IRBESARTAN 75 MG PO TABS
75.0000 mg | ORAL_TABLET | Freq: Every day | ORAL | Status: DC
  Administered 2020-12-15 – 2020-12-16 (×2): 75 mg via ORAL
  Filled 2020-12-15 (×2): qty 1

## 2020-12-15 MED ORDER — ALPRAZOLAM 0.5 MG PO TABS: 0.5000 mg | ORAL_TABLET | Freq: Every evening | ORAL | Status: DC | PRN

## 2020-12-15 MED ORDER — FENTANYL CITRATE (PF) 100 MCG/2ML IJ SOLN
INTRAMUSCULAR | Status: DC | PRN
  Administered 2020-12-15 (×2): 50 ug via INTRAVENOUS

## 2020-12-15 MED ORDER — B COMPLEX-C PO TABS
1.0000 | ORAL_TABLET | Freq: Every day | ORAL | Status: DC
  Administered 2020-12-16: 1 via ORAL
  Filled 2020-12-15: qty 1

## 2020-12-15 MED ORDER — SIMETHICONE 80 MG PO CHEW: 40.0000 mg | CHEWABLE_TABLET | Freq: Four times a day (QID) | ORAL | Status: DC | PRN

## 2020-12-15 MED ORDER — HYDROMORPHONE HCL 1 MG/ML IJ SOLN
0.2500 mg | INTRAMUSCULAR | Status: DC | PRN
Start: 2020-12-15 — End: 2020-12-15
  Administered 2020-12-15 (×2): 0.5 mg via INTRAVENOUS

## 2020-12-15 MED ORDER — EPHEDRINE SULFATE-NACL 50-0.9 MG/10ML-% IV SOSY
PREFILLED_SYRINGE | INTRAVENOUS | Status: DC | PRN
  Administered 2020-12-15 (×2): 10 mg via INTRAVENOUS

## 2020-12-15 SURGICAL SUPPLY — 86 items
APPLIER CLIP 11 MED OPEN (CLIP)
APPLIER CLIP 5 13 M/L LIGAMAX5 (MISCELLANEOUS) ×2
APPLIER CLIP LOGIC TI 5 (MISCELLANEOUS) ×2 IMPLANT
BENZOIN TINCTURE PRP APPL 2/3 (GAUZE/BANDAGES/DRESSINGS) IMPLANT
BLADE CLIPPER SENSICLIP SURGIC (BLADE) IMPLANT
BLADE HEX COATED 2.75 (ELECTRODE) ×2 IMPLANT
BLADE SURG 10 STRL SS (BLADE) IMPLANT
BLADE SURG 15 STRL LF DISP TIS (BLADE) ×1 IMPLANT
BLADE SURG 15 STRL SS (BLADE) ×2
BNDG GAUZE ELAST 4 BULKY (GAUZE/BANDAGES/DRESSINGS) IMPLANT
BRIEF STRETCH FOR OB PAD LRG (UNDERPADS AND DIAPERS) IMPLANT
CABLE HIGH FREQUENCY MONO STRZ (ELECTRODE) ×2 IMPLANT
CANISTER SUCT 1200ML W/VALVE (MISCELLANEOUS) IMPLANT
CHLORAPREP W/TINT 26 (MISCELLANEOUS) ×2 IMPLANT
CLIP APPLIE 11 MED OPEN (CLIP) IMPLANT
CLIP APPLIE 5 13 M/L LIGAMAX5 (MISCELLANEOUS) ×1 IMPLANT
CNTNR URN SCR LID CUP LEK RST (MISCELLANEOUS) IMPLANT
CONT SPEC 4OZ STRL OR WHT (MISCELLANEOUS)
COVER BACK TABLE 60X90IN (DRAPES) ×2 IMPLANT
COVER MAYO STAND STRL (DRAPES) ×2 IMPLANT
COVER WAND RF STERILE (DRAPES) ×2 IMPLANT
DECANTER SPIKE VIAL GLASS SM (MISCELLANEOUS) ×2 IMPLANT
DRAIN CHANNEL 19F RND (DRAIN) IMPLANT
DRAPE C-ARM 42X120 X-RAY (DRAPES) ×2 IMPLANT
DRAPE C-ARMOR (DRAPES) ×2 IMPLANT
DRAPE LAPAROTOMY 100X72 PEDS (DRAPES) IMPLANT
DRAPE ORTHO SPLIT 77X108 STRL (DRAPES)
DRAPE SURG ORHT 6 SPLT 77X108 (DRAPES) IMPLANT
DRAPE UTILITY XL STRL (DRAPES) IMPLANT
DRAPE WARM FLUID 44X44 (DRAPES) ×2 IMPLANT
DRSG PAD ABDOMINAL 8X10 ST (GAUZE/BANDAGES/DRESSINGS) IMPLANT
DRSG TEGADERM 4X4.75 (GAUZE/BANDAGES/DRESSINGS) ×2 IMPLANT
ELECT REM PT RETURN 9FT ADLT (ELECTROSURGICAL) ×2
ELECTRODE REM PT RTRN 9FT ADLT (ELECTROSURGICAL) ×1 IMPLANT
ENDOLOOP SUT PDS II  0 18 (SUTURE)
ENDOLOOP SUT PDS II 0 18 (SUTURE) IMPLANT
EVACUATOR SILICONE 100CC (DRAIN) IMPLANT
GAUZE SPONGE 4X4 12PLY STRL (GAUZE/BANDAGES/DRESSINGS) IMPLANT
GLOVE SRG 8 PF TXTR STRL LF DI (GLOVE) ×1 IMPLANT
GLOVE SURG LTX SZ8 (GLOVE) ×2 IMPLANT
GLOVE SURG UNDER POLY LF SZ8 (GLOVE) ×2
GOWN STRL REUS W/TWL LRG LVL3 (GOWN DISPOSABLE) ×2 IMPLANT
GOWN STRL REUS W/TWL XL LVL3 (GOWN DISPOSABLE) ×2 IMPLANT
IRRIG SUCT STRYKERFLOW 2 WTIP (MISCELLANEOUS) ×2
IRRIGATION SUCT STRKRFLW 2 WTP (MISCELLANEOUS) ×1 IMPLANT
KIT TURNOVER CYSTO (KITS) ×2 IMPLANT
NEEDLE BIOPSY 14X6 SOFT TISS (NEEDLE) ×2 IMPLANT
NEEDLE HYPO 22GX1.5 SAFETY (NEEDLE) ×2 IMPLANT
NEEDLE INSUFFLATION 120MM (ENDOMECHANICALS) IMPLANT
NS IRRIG 500ML POUR BTL (IV SOLUTION) ×2 IMPLANT
PACK BASIN DAY SURGERY FS (CUSTOM PROCEDURE TRAY) ×2 IMPLANT
PAD POSITIONING PINK XL (MISCELLANEOUS) ×2 IMPLANT
PENCIL SMOKE EVACUATOR (MISCELLANEOUS) ×2 IMPLANT
POUCH RETRIEVAL ECOSAC 10 (ENDOMECHANICALS) ×1 IMPLANT
POUCH RETRIEVAL ECOSAC 10MM (ENDOMECHANICALS) ×2
SCISSORS LAP 5X35 DISP (ENDOMECHANICALS) ×2 IMPLANT
SET CHOLANGIOGRAPH MIX (MISCELLANEOUS) ×2 IMPLANT
SET TUBE SMOKE EVAC HIGH FLOW (TUBING) ×2 IMPLANT
SHEARS HARMONIC ACE PLUS 36CM (ENDOMECHANICALS) ×2 IMPLANT
SPONGE GAUZE 2X2 8PLY STRL LF (GAUZE/BANDAGES/DRESSINGS) ×2 IMPLANT
SPONGE LAP 4X18 RFD (DISPOSABLE) IMPLANT
STOCKINETTE 6  STRL (DRAPES)
STOCKINETTE 6 STRL (DRAPES) IMPLANT
STRIP CLOSURE SKIN 1/2X4 (GAUZE/BANDAGES/DRESSINGS) IMPLANT
SUT MNCRL AB 4-0 PS2 18 (SUTURE) ×2 IMPLANT
SUT PDS AB 1 CT1 27 (SUTURE) ×12 IMPLANT
SUT PROLENE 2 0 CT2 30 (SUTURE) IMPLANT
SUT VIC AB 2-0 UR6 27 (SUTURE) IMPLANT
SUT VIC AB 3-0 SH 18 (SUTURE) ×2 IMPLANT
SUT VIC AB 3-0 SH 27 (SUTURE)
SUT VIC AB 3-0 SH 27X BRD (SUTURE) IMPLANT
SUT VICRYL 2 0 18  UND BR (SUTURE)
SUT VICRYL 2 0 18 UND BR (SUTURE) IMPLANT
SYR 20ML LL LF (SYRINGE) ×2 IMPLANT
SYR BULB IRRIG 60ML STRL (SYRINGE) ×2 IMPLANT
SYR CONTROL 10ML LL (SYRINGE) IMPLANT
TOWEL OR 17X26 10 PK STRL BLUE (TOWEL DISPOSABLE) ×4 IMPLANT
TRAY DSU PREP LF (CUSTOM PROCEDURE TRAY) ×2 IMPLANT
TRAY LAPAROSCOPIC (CUSTOM PROCEDURE TRAY) ×2 IMPLANT
TROCAR 5M 150ML BLDLS (TROCAR) ×2 IMPLANT
TROCAR BLADELESS OPT 5 100 (ENDOMECHANICALS) ×2 IMPLANT
TROCAR XCEL NON-BLD 11X100MML (ENDOMECHANICALS) IMPLANT
TROCAR Z-THREAD FIOS 5X100MM (TROCAR) ×2 IMPLANT
TUBE CONNECTING 12X1/4 (SUCTIONS) ×2 IMPLANT
WATER STERILE IRR 500ML POUR (IV SOLUTION) ×2 IMPLANT
YANKAUER SUCT BULB TIP NO VENT (SUCTIONS) ×2 IMPLANT

## 2020-12-15 NOTE — Op Note (Signed)
12/15/2020  PATIENT:  Katie Owens  70 y.o. female  Patient Care Team: Derinda Late, MD as PCP - General (Family Medicine) Thompson Grayer, MD as PCP - Cardiology (Cardiology) Michael Boston, MD as Consulting Physician (General Surgery) Thompson Grayer, MD as Consulting Physician (Cardiology) Karie Chimera, MD as Referring Physician (Neurosurgery) Rigoberto Noel, MD as Consulting Physician (Pulmonary Disease)  PRE-OPERATIVE DIAGNOSIS:    Chronic Calculus cholecystitis Abdominal wall mass.  Probable lipoma.  POST-OPERATIVE DIAGNOSIS:   Chronic Calculus cholecystitis Fatty steatohepatitis Abdominal wall mass.  Probable lipoma.  PROCEDURE:   Laparoscopic cholecystectomy with intraoperative cholangiogram (CPT code 803 [id number] [id number]) Core Liver Biopsy (CPT code 47001) Excision of abdominal wall mass  SURGEON:  Adin Hector, MD, FACS.  ASSISTANT: OR Staff   ANESTHESIA:    General with endotracheal intubation Local anesthetic as a field block  EBL:  (See Anesthesia Intraoperative Record) Total I/O In: 1014.3 [I.V.:900; IV Piggyback:114.3] Out: 10 [Blood:10]   Delay start of Pharmacological VTE agent (>24hrs) due to surgical blood loss or risk of bleeding:  no  DRAINS: None   SPECIMEN: Gallbladder & Core liver biopsies    DISPOSITION OF SPECIMEN:  PATHOLOGY  COUNTS:  YES  PLAN OF CARE: Discharge to home after PACU  PATIENT DISPOSITION:  PACU - hemodynamically stable.  INDICATION: Pleasant woman with episodes of intermittent abdominal pain and gallstones suspicious for biliary colic.  Some mildly increased liver function test.  History of a mass in her abdominal wall.  Recommendation made for removal gallbladder and study bile ducts.  Possible liver biopsy.  Maitri implant removal abdominal wall mass since it is getting larger and bothersome.  The anatomy & physiology of hepatobiliary & pancreatic function was discussed.  The pathophysiology of gallbladder dysfunction was  discussed.  Natural history risks without surgery was discussed.   I feel the risks of no intervention will lead to serious problems that outweigh the operative risks; therefore, I recommended cholecystectomy to remove the pathology.  I explained laparoscopic techniques with possible need for an open approach.  Probable cholangiogram to evaluate the bilary tract was explained as well.    Risks such as bleeding, infection, abscess, leak, injury to other organs, need for further treatment, heart attack, death, and other risks were discussed.  I noted a good likelihood this will help address the problem.  Possibility that this will not correct all abdominal symptoms was explained.  Goals of post-operative recovery were discussed as well.  We will work to minimize complications.  An educational handout further explaining the pathology and treatment options was given as well.  Questions were answered.  The patient expresses understanding & wishes to proceed with surgery.  The pathophysiology of skin & subcutaneous masses was discussed.  Natural history risks without surgery were discussed.  I recommended surgery to remove the mass.  I explained the technique of removal with use of local anesthesia & possible need for more aggressive sedation/anesthesia for patient comfort.    Risks such as bleeding, infection, wound breakdown, heart attack, death, and other risks were discussed.  I noted a good likelihood this will help address the problem.   Possibility that this will not correct all symptoms was explained. Possibility of regrowth/recurrence of the mass was discussed.  We will work to minimize complications. Questions were answered.  The patient expresses understanding & wishes to proceed with surgery.     OR FINDINGS: Dilated boggy gallbladder with chronic changes and thickening suspicious for chronic cholecystitis.  Numerous small black most  likely hemolytic type stones.  Some impacted in the cystic  duct.  Cholangiogram showed dilated biliary system but no evidence of any choledocholithiasis or leak or obstruction.  Liver:  Macronodular changes suspicious for at least fatty steatohepatitis and possible early cirrhosis.  Mostly soft though.  Liver biopsy done.  Left upper quadrant paramedian subcutaneous mass primarily lipomatous.  8 x 5 x 5 cm region.  Somewhat lobulated.  Most likely consistent with lipoma.    DESCRIPTION:   Informed consent was confirmed.  The patient underwent general anaesthesia without difficulty.  The patient was positioned appropriately.  VTE prevention in place.  The patient's abdomen was clipped, prepped, & draped in a sterile fashion.  Surgical timeout confirmed our plan.  Peritoneal entry with a laparoscopic port was obtained using optical entry technique in the right upper abdomen as the patient was positioned in reverse Trendelenburg.  Entry was clean.  I induced carbon dioxide insufflation.  Camera inspection revealed no injury.  Extra ports were carefully placed under direct laparoscopic visualization.  I turned attention to the right upper quadrant.  Gallbladder was thickened and boggy consistent with chronic cholecystitis.  I used hook cautery to free the peritoneal coverings between the gallbladder and the liver on the posteriolateral and anteriomedial walls.   I used careful blunt and hook dissection to help get a good critical view of the cystic artery and cystic duct. I did further dissection to free 50%of the gallbladder off the liver bed to get a good critical view of the infundibulum and cystic duct.  I dissected out the cystic artery; and, after getting a good 360 view, ligated the anterior & posterior branches of the cystic artery close on the infundibulum using the Harmonic ultrasonic dissection.  I skeletonized the cystic duct.  I placed a clip on the infundibulum. I did a partial cystic duct-otomy and ensured patency.  I milked back several small  spherical black stones out of the cystic duct and first valve.  I placed a 5 [place] cholangiocatheter through a puncture site at the right subcostal ridge of the abdominal wall and directed it into the cystic duct.  We ran a cholangiogram with dilute radio-opaque contrast and continuous fluoroscopy. Contrast flowed from a side branch consistent with cystic duct cannulization. Contrast flowed up the common hepatic duct into the right and left intrahepatic chains out to secondary radicals. Contrast flowed down the common bile duct easily across the normal ampulla into the duodenum.  This was consistent with a normal cholangiogram.  I removed the cholangiocatheter. I placed clips on the cystic duct x4.  I completed cystic duct transection. I freed the gallbladder from its remaining attachments to the liver.   Patient had a rather scalloped liver with at least fatty change.  I decided to do a 14-gauge Tru-Cut core biopsy through the right subcostal puncture site where cholangiography had been done.  I got 3 passes with 3 excellent cores.  I ensured hemostasis on the gallbladder fossa of the liver and elsewhere. I inspected the rest of the abdomen & detected no injury nor bleeding elsewhere.  I placed the gallbladder inside and EcoSac and removed it out the left upper quadrant larger port site.  I removed the gallbladder.  I extended the incision at this left upper quadrant region to come around the ellipsoid soft tissue mass.  It was rather a lot of fatty and lobulated.  Got the largest mass out and found a sac.  Most likely consistent with a  lipoma.  Carefully freed off until he had normal subcutaneous tissues.  I closed the fascia that had to be dilated to get the large gallbladder out using #1 PDS interrupted sutures to good result.  I closed the wound with interrupted 3-0 Vicryl's Scarpal and deep dermal sutures.  I closed the skin using 4-0 monocryl stitch.  Sterile dressings were applied. The patient was  extubated & arrived in the PACU in stable condition..  I had discussed postoperative care with the patient in the holding area. I discussed operative findings, updated the patient's status, discussed probable steps to recovery, and gave postoperative recommendations to the  patient's friend, Particia Nearing .  Recommendations were made.  Questions were answered.  She expressed understanding & appreciation.  Adin Hector, M.D., F.A.C.S. Gastrointestinal and Minimally Invasive Surgery [facility] [address], [place], [place] [address] [phone] Main / Paging  12/15/2020 9:23 AM

## 2020-12-15 NOTE — Plan of Care (Signed)
  Problem: Activity: Goal: Risk for activity intolerance will decrease Outcome: Progressing   Problem: Nutrition: Goal: Adequate nutrition will be maintained Outcome: Progressing   Problem: Safety: Goal: Ability to remain free from injury will improve Outcome: Progressing   Problem: Pain Managment: Goal: General experience of comfort will improve Outcome: Progressing   

## 2020-12-15 NOTE — Interval H&P Note (Signed)
History and Physical Interval Note:  12/15/2020 7:03 AM  Katie Owens  has presented today for surgery, with the diagnosis of SYMPTOMATIC BILIARY COLIC, PROBABLE CHRONIC CHOLECYSTITIS.  The various methods of treatment have been discussed with the patient and family. After consideration of risks, benefits and other options for treatment, the patient has consented to  Procedure(s): LAPAROSCOPIC CHOLECYSTECTOMY WITH INTRAOPERATIVE CHOLANGIOGRAM (N/A) NEEDLE CORE LIVER BIOPSY (N/A) EXCISION MASS ABDOMINAL WALL (N/A) as a surgical intervention.  The patient's history has been reviewed, patient examined, no change in status, stable for surgery.  I have reviewed the patient's chart and labs.  Questions were answered to the patient's satisfaction.    I have re-reviewed the the patient's records, history, medications, and allergies.  I have re-examined the patient.  I again discussed intraoperative plans and goals of post-operative recovery.  The patient agrees to proceed.  Katie Owens  05-Sep-1950 [id number]  Patient Care Team: Derinda Late, MD as PCP - General (Family Medicine) Thompson Grayer, MD as PCP - Cardiology (Cardiology) Michael Boston, MD as Consulting Physician (General Surgery) Thompson Grayer, MD as Consulting Physician (Cardiology) Karie Chimera, MD as Referring Physician (Neurosurgery) Rigoberto Noel, MD as Consulting Physician (Pulmonary Disease)  Patient Active Problem List   Diagnosis Date Noted   Circadian rhythm sleep disorder, delayed sleep phase type 06/15/2020   Hyperlipidemia LDL goal <70 08/25/2014   Obstructive sleep apnea 08/25/2014   Morbid obesity ([place]) 08/25/2014   HYPERLIPIDEMIA 09/20/2009   Essential hypertension 09/20/2009   ATRIAL FIBRILLATION, PAROXYSMAL 09/20/2009   GERD 09/20/2009    Past Medical History:  Diagnosis Date   Abdominal wall mass 12/09/2020   Arthritis 12/09/2020   oa   Asthma    Cancer ([place]) 07/02/1993   breast cancer lt with 12  nodes out, chemo and radiation done   Chronic cholecystitis 12/09/2020   chronic kidney disease 12/09/2020   stage 3 per pt   Contact lens/glasses fitting    wears contacts or glasses   COVID    jan  2022 cold and headache x 7 -8 days/all symptoms resolved per pt   Depression    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   Dysrhythmia    hx AF had ablation in 2012, has occ pvc's per pt   Fatty liver    Fibromyalgia    GERD (gastroesophageal reflux disease)    History of kidney stones    passed on own in past per pt   Hyperlipemia    Hypertension    IBS (irritable bowel syndrome)    migraines 12/09/2020   with weather changes per pt   PONV (postoperative nausea and vomiting)    Sciatica 12/09/2020   right butt cheek and right leg and lower spine   Seasonal allergies    Shortness of breath 12/09/2020   on exertion   Sleep apnea    cpap broken waiting on new cpap, severe osa last sleep study home few months ago ( 12-09-2020 )   Uses walker 12/09/2020   left walker with friend out of the area   Wears glasses 12/09/2020    Past Surgical History:  Procedure Laterality Date   BREAST LUMPECTOMY WITH AXILLARY LYMPH NODE DISSECTION  07/02/1993   12 nodes removed   CARDIAC CATHETERIZATION  01/24/2009   left heart   CARDIAC ELECTROPHYSIOLOGY MAPPING AND ABLATION  07/02/2010   CARPAL TUNNEL RELEASE Right 10/14/2012   Procedure: CARPAL TUNNEL RELEASE;  Surgeon: Wynonia Sours, MD;  Location: [facility];  Service: Orthopedics;  Laterality: Right;   COLONOSCOPY  2021   DIAGNOSTIC LAPAROSCOPY  07/02/1988   EP IMPLANTABLE DEVICE N/A 03/08/2016   Procedure: Loop Recorder Insertion;  Surgeon: Thompson Grayer, MD;  Location: [facility];  Service: Cardiovascular;  Laterality: N/A;   LUMBAR FUSION  2011   TONSILLECTOMY     age 70 per pt    Social History   Socioeconomic History   Marital status: Divorced    Spouse name: Not on file   Number of children: Not on file   Years of  education: Not on file   Highest education level: Not on file  Occupational History   Not on file  Tobacco Use   Smoking status: Former    Packs/day: 2.00    Years: 15.00    Pack years: 30.00    Types: Cigarettes    Quit date: 10/08/1979    Years since quitting: 41.2   Smokeless tobacco: Never  Vaping Use   Vaping Use: Never used  Substance and Sexual Activity   Alcohol use: No    Alcohol/week: 0.0 standard drinks   Drug use: No   Sexual activity: Not on file  Other Topics Concern   Not on file  Social History Narrative   Not on file   Social Determinants of Health   Financial Resource Strain: Not on file  Food Insecurity: Not on file  Transportation Needs: Not on file  Physical Activity: Not on file  Stress: Not on file  Social Connections: Not on file  Intimate Partner Violence: Not on file    Family History  Problem Relation Age of Onset   Stroke Mother    Hypertension Mother    Alcoholism Mother    Alcoholism Father    Alcoholism Brother    Hypertension Brother    Hyperlipidemia Brother    Anemia Brother    Other Brother        bleeding hemorroids   Heart disease Brother        valve surgery   Heart attack Maternal Grandmother        heart valve surgery   Hyperlipidemia Maternal Grandmother    Hypertension Maternal Grandmother     Medications Prior to Admission  Medication Sig Dispense Refill Last Dose   ALPRAZolam (XANAX) 0.5 MG tablet Take 0.5 mg by mouth at bedtime as needed for sleep.   1 Past Week   b complex vitamins tablet Take 1 tablet by mouth daily.   12/14/2020   beclomethasone (QVAR) 80 MCG/ACT inhaler Inhale 1 puff into the lungs 2 (two) times daily.    Past Week   carvedilol (COREG) 25 MG tablet Take 1 tablet (25 mg total) by mouth 2 (two) times daily. 180 tablet 3 12/15/2020 at 0400   celecoxib (CELEBREX) 200 MG capsule Take 1 capsule by mouth 2 (two) times daily.   12/14/2020   chlorthalidone (HYGROTON) 25 MG tablet Take 25 mg by mouth  daily.   12/14/2020   cholecalciferol (VITAMIN D) 1000 UNITS tablet Take 2,000 Units by mouth daily.    12/14/2020   cyclobenzaprine (FLEXERIL) 10 MG tablet Take 10 mg by mouth at bedtime.   12/14/2020   diphenhydrAMINE (BENADRYL) 25 MG tablet Take 25 mg by mouth every 6 (six) hours as needed for allergies.   12/14/2020   DULoxetine (CYMBALTA) 60 MG capsule Take 1 capsule by mouth daily.   12/15/2020 at 0400   HYDROcodone-acetaminophen (NORCO) 5-325 MG per tablet Take 1 tablet by  mouth every 6 (six) hours as needed for pain. 30 tablet 0 Past Week   loperamide (IMODIUM) 2 MG capsule Take 2 mg by mouth as needed.   Past Month   magnesium oxide (MAG-OX) 400 MG tablet Take 0.5 tablets (200 mg total) by mouth every other day. 90 tablet 1 12/14/2020   Multiple Vitamins-Minerals (MULTIVITAMIN ADULT PO) Take 1 tablet by mouth daily.   12/14/2020   omeprazole (PRILOSEC) 20 MG capsule Take 20 mg by mouth daily.   12/15/2020 at 0400   OZEMPIC, 0.25 OR 0.5 MG/DOSE, 2 MG/1.5ML SOPN Inject 1 mg as directed once a week. monday   Past Week   potassium chloride SA (K-DUR,KLOR-CON) 20 MEQ tablet Take 20 mEq by mouth daily.    12/14/2020   pregabalin (LYRICA) 75 MG capsule Take 75 mg by mouth 2 (two) times daily.   12/15/2020 at 0400   rosuvastatin (CRESTOR) 10 MG tablet Take 10 mg by mouth at bedtime.   12/14/2020   telmisartan (MICARDIS) 40 MG tablet Take 0.5 tablets by mouth daily.   12/14/2020   VENTOLIN HFA 108 (90 BASE) MCG/ACT inhaler Inhale 1-2 puffs into the lungs every 6 (six) hours as needed for wheezing or shortness of breath.   3 Past Week   vitamin B-12 (CYANOCOBALAMIN) 1000 MCG tablet Take 5,000 mcg by mouth daily.    12/14/2020   diltiazem (CARDIZEM) 30 MG tablet Take 1 tablet by mouth every 4 hours AS NEEDED for HR>100 and BP>100   Unknown   losartan (COZAAR) 50 MG tablet Take 50 mg by mouth daily.      SUMAtriptan (IMITREX) 100 MG tablet Take 1 tablet by mouth as needed for migraine.   More than a month     Current Facility-Administered Medications  Medication Dose Route Frequency Provider Last Rate Last Admin   0.9 %  sodium chloride infusion   Intravenous Continuous Lidia Collum, MD 50 mL/hr at 12/15/20 0629 New Bag at 12/15/20 0629   bupivacaine liposome (EXPAREL) 1.3 % injection 266 mg  20 mL Infiltration Once Michael Boston, MD       Chlorhexidine Gluconate Cloth 2 % PADS 6 each  6 each Topical Once Michael Boston, MD       And   Chlorhexidine Gluconate Cloth 2 % PADS 6 each  6 each Topical Once Michael Boston, MD       Derrill Memo ON 12/16/2020] feeding supplement (ENSURE PRE-SURGERY) liquid 296 mL  296 mL Oral Once Michael Boston, MD       gabapentin (NEURONTIN) capsule 300 mg  300 mg Oral On Call to OR Michael Boston, MD         Allergies  Allergen Reactions   Penicillins     Joints ache   Aspirin Hives   Ibuprofen Hives    BP (!) 149/95   Pulse 93   Temp 99.1 F (37.3 C) (Oral)   Resp 17   Ht 4\' 11"  (1.499 m)   Wt 98 kg   SpO2 98%   BMI 43.65 kg/m   Labs: No results found for this or any previous visit (from the past 48 hour(s)).  Imaging / Studies: No results found.   Adin Hector, M.D., F.A.C.S. Gastrointestinal and Minimally Invasive Surgery [facility] [address], [place], [place] [address] [phone] Main / Paging  12/15/2020 7:03 AM    Adin Hector

## 2020-12-15 NOTE — Anesthesia Procedure Notes (Signed)
Procedure Name: Intubation Date/Time: 12/15/2020 7:40 AM Performed by: Suan Halter, CRNA Pre-anesthesia Checklist: Patient identified, Emergency Drugs available, Suction available and Patient being monitored Patient Re-evaluated:Patient Re-evaluated prior to induction Oxygen Delivery Method: Circle system utilized Preoxygenation: Pre-oxygenation with 100% oxygen Induction Type: IV induction Ventilation: Mask ventilation without difficulty Laryngoscope Size: Mac and 3 Grade View: Grade I Tube type: Oral Tube size: 7.0 mm Number of attempts: 1 Airway Equipment and Method: Stylet and Oral airway Placement Confirmation: ETT inserted through vocal cords under direct vision, positive ETCO2 and breath sounds checked- equal and bilateral Secured at: 22 cm Tube secured with: Tape Dental Injury: Teeth and Oropharynx as per pre-operative assessment

## 2020-12-15 NOTE — Transfer of Care (Signed)
Immediate Anesthesia Transfer of Care Note  Patient: Katie Owens  Procedure(s) Performed: Procedure(s) (LRB): LAPAROSCOPIC CHOLECYSTECTOMY WITH INTRAOPERATIVE CHOLANGIOGRAM, TAP BLOCK (N/A) NEEDLE CORE LIVER BIOPSY (N/A) EXCISION MASS ABDOMINAL WALL (N/A)  Patient Location: PACU  Anesthesia Type: General  Level of Consciousness: awake, oriented, sedated and patient cooperative  Airway & Oxygen Therapy: Patient Spontanous Breathing and Patient connected to face mask oxygen  Post-op Assessment: Report given to PACU RN and Post -op Vital signs reviewed and stable  Post vital signs: Reviewed and stable  Complications: No apparent anesthesia complications Last Vitals:  Vitals Value Taken Time  BP 134/57 12/15/20 0930  Temp    Pulse 82 12/15/20 0937  Resp 15 12/15/20 0937  SpO2 92 % 12/15/20 0937  Vitals shown include unvalidated device data.  Last Pain:  Vitals:   12/15/20 0611  TempSrc: Oral  PainSc: 3       Patients Stated Pain Goal: 3 (67/59/16 3846)  Complications: No notable events documented.

## 2020-12-15 NOTE — H&P (Addendum)
12/15/2020  Katie Owens DOB: 1950-11-09 Divorced / Language: [name] / Race: White Female   Patient Care Team: Derinda Late, MD as PCP - General (Family Medicine) Thompson Grayer, MD as PCP - Cardiology (Cardiology) Michael Boston, MD as Consulting Physician (General Surgery) Thompson Grayer, MD as Consulting Physician (Cardiology) Karie Chimera, MD as Referring Physician (Neurosurgery) Rigoberto Noel, MD as Consulting Physician (Pulmonary Disease)  ` ` Patient sent for surgical consultation at the request of Dr Arcelia Jew   Chief Complaint: Recurrent gallbladder attacks. ` ` The patient is a morbidly obese woman with IBS, diabetes and numerous other health issues.  Followed by [facility].  Dr. Elsworth Soho.  Recently diagnosed with obstructive sleep apnea.  CPAP recommended.  Frequent ectopy with disorganized atrial activity.  Has a pacemaker.  Followed by Dr. [name] with cardiology.  Found to have episodes of pain and nausea and vomiting.  Noncardiac chest pain.  Ultrasound showed Gallstones.  At least 5 attacks.  Saw Dr. Lucia Gaskins with our group in 2019.  Surgery offered.  Sounds like she declined and wished to observe.  Sounds like she's had some recurrent attacks.  She's had some dietary issues with her diabetes but is working to change her medications around.  Hemoglobin A1c was elevated.  She saw endocrinology.  Had some adjustments in her primary care physician.  A1c less than 7 now.  Wish to reconsider surgery.  Dr. Lucia Gaskins has retired, so patient follows up with me.  Patient ready for surgery   (Review of systems as stated in this history (HPI) or in the review of systems.  Otherwise all other 12 point ROS are negative)   Past Medical History: 1.  History of A Fib - ablation at [facility] by Dr. Clyda Hurdle in 2012. 2.  MDT ILR (insertable loop recorder) implanted 03/18/2016, Dr. [name].  She often sees his assistant, Renee 3.  DM since 2004 4.  Fibromyalgia     On Lyrica 5.  HTN 6.   HLD 7.  OSA with CPAP     also some asthma 8.  History of left breast cancer - 1995 9.  History of depression    On Tragenta and Cymbalta 10.  Morbid obesity - BMI - 46 11. endometriosis.  Seen on laparoscopy by Dr. Lemmie Evens. Mezer   12.  Diabetic foot neuropathy. 13.  C3-C4 neck surgery - 2015 - Dr. Hal Neer     L4-L5 back surgery around 2009 14.  IBS - tends toward the diarrhea side     Sees dr. Cleaster Corin.   Social History: Divorced. Retired from the [employer].  (this has to do with [employer]) No children ` ` ###########################################`   This patient encounter took 35 minutes today to perform the following: obtain history, perform exam, review outside records, interpret tests & imaging, counsel the patient on their diagnosis; and, document this encounter, including findings & plan in the electronic health record (EHR).     Allergies Janeann Forehand, CNA; 08/11/2020 2:02 PM) Aspirin 81 *ANALGESICS - NonNarcotic*  oxyCODONE-Ibuprofen  NSAIDs  Allergies Reconciled    Medication History Janeann Forehand, CNA; 08/11/2020 2:02 PM) ALPRAZolam  (0.5MG  Tablet, Oral) Active. Vitamin C  (100MG  Tablet, Oral) Active. Carvedilol  (25MG  Tablet, Oral) Active. Chlorthalidone  (50MG  Tablet, Oral) Active. Hydrocodone-Acetaminophen  (5-325MG  Tablet, Oral) Active. Tradjenta  (5MG  Tablet, Oral) Active. Rosuvastatin Calcium  (10MG  Tablet, Oral) Active. ProAir HFA  (108 (90 Base)MCG/ACT Aerosol Soln, Inhalation) Active. Omeprazole  (20MG  Capsule DR, Oral) Active.  MetFORMIN HCl ER  (500MG  Tablet ER 24HR, Oral) Active. Losartan Potassium  (50MG  Tablet, Oral) Active. Klor-Con M20  [facility] Tablet ER, Oral) Active. DULoxetine HCl  (60MG  Capsule DR Part, Oral) Active. Cyclobenzaprine HCl  (10MG  Tablet, Oral) Active. Lyrica  (75MG  Capsule, Oral) Active. Beclomethasone Diprop (Nasal)  (42MCG/ACT Aerosol Soln, Nasal) Active. Co-Enzyme Q-10  (30MG  Capsule, Oral)  Active. Cardizem  (30MG  Tablet, Oral) Active. Benadryl Allergy  (25MG  Tablet, Oral) Active. Lodine  (400MG  Tablet, Oral) Active. Multi-Vitamin  (Oral) Active. Medications Reconciled        Vitals (Donyelle Alston CNA; 08/11/2020 2:03 PM) 08/11/2020 2:02 PM Weight: 222.13 lb   Height: 60 in  Body Surface Area: 1.95 m   Body Mass Index: 43.38 kg/m   Temp.: 94.8 F    Pulse: 111 (Regular)    P.OX: 92% (Room air) BP: 120/60(Sitting, Left Arm, Standard)      BP (!) 149/95   Pulse 93   Temp 99.1 F (37.3 C) (Oral)   Resp 17   Ht 4\' 11"  (1.499 m)   Wt 98 kg   SpO2 98%   BMI 43.65 kg/m  12/15/2020   General: Pt awake/alert/oriented x4 in  no major acute distress Eyes: PERRL, normal EOM. Sclera nonicteric Neuro: CN II-XII intact w/o focal sensory/motor deficits. Lymph: No head/neck/groin lymphadenopathy Psych:  No delerium/psychosis/paranoia HENT: Normocephalic, Mucus membranes moist.  No thrush Neck: Supple, No tracheal deviation Chest: No pain.  Good respiratory excursion. CV:  Pulses intact.   Regular rhythm MS: Normal AROM mjr joints.  No obvious deformity Abdomen: Soft, Nondistended.  Nontender.  No incarcerated hernias. Ext:  SCDs BLE.  No significant edema.  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 Enlarging. Most likely consistent with lipoma. Can try and excise at the time of surgery or at least rule out a hernia. Could remove the gallbladder from there as the extraction point.     CHRONIC CHOLECYSTITIS WITH CALCULUS (K80.10) Impression: Rather classic story of recurrent biliary colic with known gallstones. Most recent attack early 2022.   Agree with Dr. Lucia Gaskins that she would benefit from cholecystectomy. Reasonable laparoscopic approach. Don't know she will be get a single site candidate. Can start with that and see how things go.   Current  Plans You are being scheduled for surgery - Our schedulers will call you.    You should hear from our office's scheduling department within 5 working days about the location, date, and time of surgery.  We try to make accommodations for patient's preferences in scheduling surgery, but sometimes the OR schedule or the surgeon's schedule prevents [place] from making those accommodations.   If you have not heard from our office [phone]) in 5 working days, call the office and ask for your surgeon's nurse.   If you have other questions about your diagnosis, plan, or surgery, call the office and ask for your surgeon's nurse.   Written instructions provided Pt Education - Pamphlet Given - Laparoscopic Gallbladder Surgery: discussed with patient and provided information. The anatomy & physiology of hepatobiliary & pancreatic function was discussed.  The pathophysiology of gallbladder dysfunction was discussed.  Natural history risks without surgery was discussed.   I feel the risks of no intervention will lead to serious problems that outweigh the operative risks; therefore, I recommended cholecystectomy to remove the pathology.  I explained laparoscopic techniques  with possible need for an open approach.  Probable cholangiogram to evaluate the bilary tract was explained as well.    Risks such as bleeding, infection, abscess, leak, injury to other organs, need for further treatment, heart attack, death, and other risks were discussed.  I noted a good likelihood this will help address the problem.  Possibility that this will not correct all abdominal symptoms was explained.  Goals of post-operative recovery were discussed as well.  We will work to minimize complications.  An educational handout further explaining the pathology and treatment options was given as well.  Questions were answered.  The patient expresses understanding & wishes to proceed with surgery.   Pt Education - [facility] Laparosopic Post Op HCI  (Quintavius Niebuhr) Pt Education - [facility] Good Bowel Health (Myrick Mcnairy) Pt Education - Laparoscopic Cholecystectomy: gallbladder   HISTORY OF CHRONIC ATRIAL FIBRILLATION (Z86.79)   Current Plans I recommended obtaining preoperative cardiac clearance.  I am concerned about the health of the patient and the ability to tolerate the operation.  Therefore, we will request clearance by cardiology to better assess operative risk & see if a reevaluation, further workup, etc is needed.  Also recommendations on how medications such as for anticoagulation and blood pressure should be managed/held/restarted after surgery.   STATUS POST PLACEMENT OF IMPLANTABLE LOOP RECORDER (Z95.818)     OSA ON CPAP (G47.33)     OBESITY, MORBID, BMI 40.0-49.9 (E66.01)     DIABETES MELLITUS TYPE 2 IN OBESE (E11.69)     AMBULATES WITH CANE (Z99.89)     Adin Hector, MD, FACS, MASCRS Esophageal, Gastrointestinal & Colorectal Surgery Robotic and Minimally Invasive Surgery  [facility] [address] [place], [place] [address] [phone] Fax [phone] Main/Paging  CONTACT INFORMATION: Weekday (9AM-5PM) concerns: Call [facility] main office at [phone] Weeknight (5PM-9AM) or Weekend/Holiday concerns: Check [email or web address] for General Surgery [facility] coverage (Please, do not use SecureChat as it is not reliable communication to operating surgeons for immediate patient care)

## 2020-12-15 NOTE — Discharge Instructions (Addendum)
LAPAROSCOPIC SURGERY: POST OP INSTRUCTIONS  ######################################################################  EAT Gradually transition to a high fiber diet with a fiber supplement over the next few weeks after discharge.  Start with a pureed / full liquid diet (see below)  WALK Walk an hour a day.  Control your pain to do that.    CONTROL PAIN Control pain so that you can walk, sleep, tolerate sneezing/coughing, go up/down stairs.  HAVE A BOWEL MOVEMENT DAILY Keep your bowels regular to avoid problems.  OK to try a laxative to override constipation.  OK to use an antidairrheal to slow down diarrhea.  Call if not better after 2 tries  CALL IF YOU HAVE PROBLEMS/CONCERNS Call if you are still struggling despite following these instructions. Call if you have concerns not answered by these instructions  ######################################################################    DIET: Follow a light bland diet & liquids the first 24 hours after arrival home, such as soup, liquids, starches, etc.  Be sure to drink plenty of fluids.  Quickly advance to a usual solid diet within a few days.  Avoid fast food or heavy meals as your are more likely to get nauseated or have irregular bowels.  A low-fat, high-fiber diet for the rest of your life is ideal.  Take your usually prescribed home medications unless otherwise directed.  PAIN CONTROL: Pain is best controlled by a usual combination of three different methods TOGETHER: Ice/Heat Over the counter pain medication Prescription pain medication Most patients will experience some swelling and bruising around the incisions.  Ice packs or heating pads (30-60 minutes up to 6 times a day) will help. Use ice for the first few days to help decrease swelling and bruising, then switch to heat to help relax tight/sore spots and speed recovery.  Some people prefer to use ice alone, heat alone, alternating between ice & heat.  Experiment to what works for  you.  Swelling and bruising can take several weeks to resolve.   It is helpful to take an over-the-counter pain medication regularly for the first few weeks.  Choose one of the following that works best for you: Naproxen (Aleve, etc)  Two 220mg  tabs twice a day Ibuprofen (Advil, etc) Three 200mg  tabs four times a day (every meal & bedtime) Acetaminophen (Tylenol, etc) 500-650mg  four times a day (every meal & bedtime) **Do not take until 12pm today if needed. A  prescription for pain medication (such as oxycodone, hydrocodone, tramadol, gabapentin, methocarbamol, etc) should be given to you upon discharge.  Take your pain medication as prescribed.  If you are having problems/concerns with the prescription medicine (does not control pain, nausea, vomiting, rash, itching, etc), please call us [phone] to see if we need to switch you to a different pain medicine that will work better for you and/or control your side effect better. If you need a refill on your pain medication, please give [place] 48 hour notice.  contact your pharmacy.  They will contact our office to request authorization. Prescriptions will not be filled after 5 pm or on week-ends  Avoid getting constipated.   Between the surgery and the pain medications, it is common to experience some constipation.   Increasing fluid intake and taking a fiber supplement (such as Metamucil, Citrucel, FiberCon, MiraLax, etc) 1-2 times a day regularly will usually help prevent this problem from occurring.   A mild laxative (prune juice, Milk of Magnesia, MiraLax, etc) should be taken according to package directions if there are no bowel movements after 48 hours.  Watch out for diarrhea.   If you have many loose bowel movements, simplify your diet to bland foods & liquids for a few days.   Stop any stool softeners and decrease your fiber supplement.   Switching to mild anti-diarrheal medications (Kayopectate, Pepto Bismol) can help.   If this  worsens or does not improve, please call us.  Wash / shower every day.  You may shower over the dressings as they are waterproof.  Continue to shower over incision(s) after the dressing is off.  REMOVE ALL DRESSINGS: Remove your waterproof bandages (tegaderm clear band-aids, steristrip skin tapes, etc) THREE DAYS AFTER SURGERY.  You may leave the incisions open to air.  You may replace a dressing/Band-Aid to cover the incision for comfort if you wish.   ACTIVITIES as tolerated:   You may resume regular (light) daily activities beginning the next day--such as daily self-care, walking, climbing stairs--gradually increasing activities as tolerated.  If you can walk 30 minutes without difficulty, it is safe to try more intense activity such as jogging, treadmill, bicycling, low-impact aerobics, swimming, etc. Save the most intensive and strenuous activity for last such as sit-ups, heavy lifting, contact sports, etc  Refrain from any heavy lifting or straining until you are off narcotics for pain control.   DO NOT PUSH THROUGH PAIN.  Let pain be your guide: If it hurts to do something, don't do it.  Pain is your body warning you to avoid that activity for another week until the pain goes down. You may drive when you are no longer taking prescription pain medication, you can comfortably wear a seatbelt, and you can safely maneuver your car and apply brakes. You may have sexual intercourse when it is comfortable.  FOLLOW UP in our office Please call [facility] at [phone] to set up an appointment to see your surgeon in the office for a follow-up appointment approximately 2-3 weeks after your surgery. Make sure that you call for this appointment the day you arrive home to insure a convenient appointment time.  10. IF YOU HAVE DISABILITY OR FAMILY LEAVE FORMS, BRING THEM TO THE OFFICE FOR PROCESSING.  DO NOT GIVE THEM TO YOUR DOCTOR.   WHEN TO CALL us [phone]: Poor pain control Reactions /  problems with new medications (rash/itching, nausea, etc)  Fever over 101.5 F (38.5 C) Inability to urinate Nausea and/or vomiting Worsening swelling or bruising Continued bleeding from incision. Increased pain, redness, or drainage from the incision   The clinic staff is available to answer your questions during regular business hours (8:30am-5pm).  Please don't hesitate to call and ask to speak to one of our nurses for clinical concerns.   If you have a medical emergency, go to the nearest emergency room or call 911.  A surgeon from [facility] is always on call at the [facility], [place], [place], [place], [place]  [address] ? MAIN: [phone] ? TOLL FREE: [phone] ?  FAX [phone] [email or web address]    Post Anesthesia Home Care Instructions  Activity: Get plenty of rest for the remainder of the day. A responsible individual must stay with you for 24 hours following the procedure.  For the next 24 hours, DO NOT: -Drive a car -Paediatric nurse -Drink alcoholic beverages -Take any medication unless instructed by your physician -Make any legal decisions or sign important papers.  Meals: Start with liquid foods such as gelatin or soup. Progress to regular foods  as tolerated. Avoid greasy, spicy, heavy foods. If nausea and/or vomiting occur, drink only clear liquids until the nausea and/or vomiting subsides. Call your physician if vomiting continues.  Special Instructions/Symptoms: Your throat may feel dry or sore from the anesthesia or the breathing tube placed in your throat during surgery. If this causes discomfort, gargle with warm salt water. The discomfort should disappear within 24 hours.  If you had a scopolamine patch placed behind your ear for the management of post- operative nausea and/or vomiting:  1. The medication in the patch is effective for 72 hours, after which it should be  removed.  Wrap patch in a tissue and discard in the trash. Wash hands thoroughly with soap and water. 2. You may remove the patch earlier than 72 hours if you experience unpleasant side effects which may include dry mouth, dizziness or visual disturbances. 3. Avoid touching the patch. Wash your hands with soap and water after contact with the patch.  **No acetaminophen or Tylenol until 12pm today if needed.

## 2020-12-15 NOTE — Progress Notes (Signed)
Patient with desaturation when she is resting.  History of sleep apnea noncompliant on CPAP due to intolerance of mask.  Saturating seem to normalize when she is alert.  Will place in observation overnight with oxygen.  Try diuresis.  If persistent hypoxia, consider medical and/or pulmonary consultation tomorrow for adjustment.  Katie Hector, MD, FACS, MASCRS Esophageal, Gastrointestinal & Colorectal Surgery Robotic and Minimally Invasive Surgery  [facility] [address], [place], [place] [address] [phone] Fax [phone] Main/Paging  CONTACT INFORMATION: Weekday (9AM-5PM) concerns: Call [facility] main office at [phone] Weeknight (5PM-9AM) or Weekend/Holiday concerns: Check [email or web address] for General Surgery [facility] coverage (Please, do not use SecureChat as it is not reliable communication to operating surgeons for immediate patient care)

## 2020-12-15 NOTE — Anesthesia Postprocedure Evaluation (Signed)
Anesthesia Post Note  Patient: Katie Owens  Procedure(s) Performed: LAPAROSCOPIC CHOLECYSTECTOMY WITH INTRAOPERATIVE CHOLANGIOGRAM, TAP BLOCK (Abdomen) NEEDLE CORE LIVER BIOPSY (Abdomen) EXCISION MASS ABDOMINAL WALL (Abdomen)     Patient location during evaluation: PACU Anesthesia Type: General Level of consciousness: sedated Pain management: pain level controlled Vital Signs Assessment: post-procedure vital signs reviewed and stable Respiratory status: spontaneous breathing and respiratory function stable Cardiovascular status: stable Postop Assessment: no apparent nausea or vomiting Anesthetic complications: no   No notable events documented.  Last Vitals:  Vitals:   12/15/20 1422 12/15/20 1423  BP:  (!) 104/51  Pulse:  87  Resp:  16  Temp: 36.9 C 36.9 C  SpO2:  93%    Last Pain:  Vitals:   12/15/20 1423  TempSrc: Oral  PainSc: 0-No pain                 Tywana Robotham DANIEL

## 2020-12-16 ENCOUNTER — Encounter (HOSPITAL_BASED_OUTPATIENT_CLINIC_OR_DEPARTMENT_OTHER): Payer: Self-pay | Admitting: Surgery

## 2020-12-16 DIAGNOSIS — K8010 Calculus of gallbladder with chronic cholecystitis without obstruction: Secondary | ICD-10-CM | POA: Diagnosis not present

## 2020-12-16 LAB — CBC
HCT: 37.5 % (ref 36.0–46.0)
Hemoglobin: 12.3 g/dL (ref 12.0–15.0)
MCH: 29.8 pg (ref 26.0–34.0)
MCHC: 32.8 g/dL (ref 30.0–36.0)
MCV: 90.8 fL (ref 80.0–100.0)
Platelets: 160 10*3/uL (ref 150–400)
RBC: 4.13 MIL/uL (ref 3.87–5.11)
RDW: 13.2 % (ref 11.5–15.5)
WBC: 10 10*3/uL (ref 4.0–10.5)
nRBC: 0 % (ref 0.0–0.2)

## 2020-12-16 LAB — BASIC METABOLIC PANEL
Anion gap: 7 (ref 5–15)
BUN: 21 mg/dL (ref 8–23)
CO2: 24 mmol/L (ref 22–32)
Calcium: 8.8 mg/dL — ABNORMAL LOW (ref 8.9–10.3)
Chloride: 103 mmol/L (ref 98–111)
Creatinine, Ser: 1.3 mg/dL — ABNORMAL HIGH (ref 0.44–1.00)
GFR, Estimated: 44 mL/min — ABNORMAL LOW (ref >60)
Glucose, Bld: 102 mg/dL — ABNORMAL HIGH (ref 70–99)
Potassium: 3.8 mmol/L (ref 3.5–5.1)
Sodium: 134 mmol/L — ABNORMAL LOW (ref 135–145)

## 2020-12-16 LAB — SURGICAL PATHOLOGY

## 2020-12-16 LAB — MAGNESIUM: Magnesium: 1.7 mg/dL (ref 1.7–2.4)

## 2020-12-16 MED ORDER — MAGNESIUM OXIDE -MG SUPPLEMENT 400 (240 MG) MG PO TABS
200.0000 mg | ORAL_TABLET | ORAL | Status: DC
  Administered 2020-12-16: 200 mg via ORAL
  Filled 2020-12-16: qty 1

## 2020-12-16 NOTE — Progress Notes (Signed)
D/C instructions given to patient. Patient had no questions. NT or writer will wheel patient out once her ride gets here

## 2020-12-16 NOTE — Discharge Summary (Signed)
Physician Discharge Summary    Patient ID: Katie Owens MRN: [id number] DOB/AGE: 1951/01/14  70 y.o.  Patient Care Team: Derinda Late, MD as PCP - General (Family Medicine) Thompson Grayer, MD as PCP - Cardiology (Cardiology) Michael Boston, MD as Consulting Physician (General Surgery) Thompson Grayer, MD as Consulting Physician (Cardiology) Karie Chimera, MD as Referring Physician (Neurosurgery) Rigoberto Noel, MD as Consulting Physician (Pulmonary Disease)  Admit date: 12/15/2020  Discharge date: 12/16/2020  Hospital Stay = 0 days    Discharge Diagnoses:  Principal Problem:   Hypoxia Active Problems:   Obstructive sleep apnea   Morbid obesity ([place])   Chronic calculous cholecystitis s/p lap cholecystectomy 12/15/2020   NAFLD (nonalcoholic fatty liver disease)   1 Day Post-Op  12/15/2020  POST-OPERATIVE DIAGNOSIS:   SYMPTOMATIC BILIARY COLIC, PROBABLE CHRONIC CHOLECYSTITIS  SURGERY:  12/15/2020  Procedure(s): LAPAROSCOPIC CHOLECYSTECTOMY WITH INTRAOPERATIVE CHOLANGIOGRAM, TAP BLOCK NEEDLE CORE LIVER BIOPSY EXCISION MASS ABDOMINAL WALL  SURGEON:    Surgeon(s): Michael Boston, MD  Consults: None  Hospital Course:   The patient underwent the surgery above.  Postoperatively, the patient gradually mobilized and advanced to a solid diet.  Pain and other symptoms were treated aggressively.    By the time of discharge, the patient was walking well the hallways, eating food, having flatus.  Pain was well-controlled on an oral medications.  Based on meeting discharge criteria and continuing to recover, I felt it was safe for the patient to be discharged from the hospital to further recover with close followup. Postoperative recommendations were discussed in detail.  They are written as well.  Discharged Condition: good  Discharge Exam: Blood pressure (!) 130/59, pulse 90, temperature 98.9 F (37.2 C), temperature source Oral, resp. rate 18, height 4\' 11"  (1.499 m),  weight 98 kg, SpO2 97 %.  General: Pt awake/alert/oriented x4 in No acute distress Eyes: PERRL, normal EOM.  Sclera clear.  No icterus Neuro: CN II-XII intact w/o focal sensory/motor deficits. Lymph: No head/neck/groin lymphadenopathy Psych:  No delerium/psychosis/paranoia HENT: Normocephalic, Mucus membranes moist.  No thrush Neck: Supple, No tracheal deviation Chest:  No chest wall pain w good excursion CV:  Pulses intact.  Regular rhythm MS: Normal AROM mjr joints.  No obvious deformity Abdomen: Soft.  Nondistended.  Nontender.  No evidence of peritonitis.  No incarcerated hernias. Ext:  SCDs BLE.  No mjr edema.  No cyanosis Skin: No petechiae / purpura   Disposition:    Follow-up Information     Michael Boston, MD. Schedule an appointment as soon as possible for a visit in 3 week(s).   Specialties: General Surgery, Colon and Rectal Surgery Why: To follow up after your operation Contact information: [place] [address] [phone]                 Discharge disposition: 01-Home or Self Care             Significant Diagnostic Studies:  Results for orders placed or performed during the hospital encounter of 12/15/20 (from the past 72 hour(s))  Glucose, capillary     Status: Abnormal   Collection Time: 12/15/20  6:36 AM  Result Value Ref Range   Glucose-Capillary 160 (H) 70 - 99 mg/dL    Comment: Glucose reference range applies only to samples taken after fasting for at least 8 hours.  Glucose, capillary     Status: Abnormal   Collection Time: 12/15/20  9:37 AM  Result Value Ref Range   Glucose-Capillary  244 (H) 70 - 99 mg/dL    Comment: Glucose reference range applies only to samples taken after fasting for at least 8 hours.  Basic metabolic panel     Status: Abnormal   Collection Time: 12/16/20  4:40 AM  Result Value Ref Range   Sodium 134 (L) 135 - 145 mmol/L   Potassium 3.8 3.5 - 5.1 mmol/L   Chloride 103 98 - 111  mmol/L   CO2 24 22 - 32 mmol/L   Glucose, Bld 102 (H) 70 - 99 mg/dL    Comment: Glucose reference range applies only to samples taken after fasting for at least 8 hours.   BUN 21 8 - 23 mg/dL   Creatinine, Ser 1.30 (H) 0.44 - 1.00 mg/dL   Calcium 8.8 (L) 8.9 - 10.3 mg/dL   GFR, Estimated 44 (L) >60 mL/min    Comment: (NOTE) Calculated using the CKD-EPI Creatinine Equation (2021)    Anion gap 7 5 - 15    Comment: Performed at [facility], [place] [address], [place], [place] [address]  Magnesium     Status: None   Collection Time: 12/16/20  4:40 AM  Result Value Ref Range   Magnesium 1.7 1.7 - 2.4 mg/dL    Comment: Performed at [facility], [place] [address] [place], [place] [address]  CBC     Status: None   Collection Time: 12/16/20  4:40 AM  Result Value Ref Range   WBC 10.0 4.0 - 10.5 K/uL   RBC 4.13 3.87 - 5.11 MIL/uL   Hemoglobin 12.3 12.0 - 15.0 g/dL   HCT 37.5 36.0 - 46.0 %   MCV 90.8 80.0 - 100.0 fL   MCH 29.8 26.0 - 34.0 pg   MCHC 32.8 30.0 - 36.0 g/dL   RDW 13.2 11.5 - 15.5 %   Platelets 160 150 - 400 K/uL   nRBC 0.0 0.0 - 0.2 %    Comment: Performed at [facility], [place] [address] [place], [place] [address]    No results found.  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   Dysrhythmia    hx AF had ablation in 2012, has occ pvc's per pt   Fatty liver    Fibromyalgia    GERD (gastroesophageal reflux disease)    History of kidney stones    passed on own in past per pt   Hyperlipemia     Hypertension    IBS (irritable bowel syndrome)    migraines 12/09/2020   with weather changes per pt   PONV (postoperative nausea and vomiting)    Sciatica 12/09/2020   right butt cheek and right leg and lower spine   Seasonal allergies    Shortness of breath 12/09/2020   on exertion   Sleep apnea    cpap broken waiting on new cpap, severe osa last sleep study home few months ago ( 12-09-2020 )   Uses walker 12/09/2020  left walker with friend out of the area   Wears glasses 12/09/2020    Past Surgical History:  Procedure Laterality Date   BREAST LUMPECTOMY WITH AXILLARY LYMPH NODE DISSECTION  07/02/1993   12 nodes removed   CARDIAC CATHETERIZATION  01/24/2009   left heart   CARDIAC ELECTROPHYSIOLOGY MAPPING AND ABLATION  07/02/2010   CARPAL TUNNEL RELEASE Right 10/14/2012   Procedure: CARPAL TUNNEL RELEASE;  Surgeon: Wynonia Sours, MD;  Location: [facility];  Service: Orthopedics;  Laterality: Right;   COLONOSCOPY  2021   DIAGNOSTIC LAPAROSCOPY  07/02/1988   EP IMPLANTABLE DEVICE N/A 03/08/2016   Procedure: Loop Recorder Insertion;  Surgeon: Thompson Grayer, MD;  Location: [facility];  Service: Cardiovascular;  Laterality: N/A;   LUMBAR FUSION  2011   TONSILLECTOMY     age 45 per pt    Social History   Socioeconomic History   Marital status: Divorced    Spouse name: Not on file   Number of children: Not on file   Years of education: Not on file   Highest education level: Not on file  Occupational History   Not on file  Tobacco Use   Smoking status: Former    Packs/day: 2.00    Years: 15.00    Pack years: 30.00    Types: Cigarettes    Quit date: 10/08/1979    Years since quitting: 41.2   Smokeless tobacco: Never  Vaping Use   Vaping Use: Never used  Substance and Sexual Activity   Alcohol use: No    Alcohol/week: 0.0 standard drinks   Drug use: No   Sexual activity: Not on file  Other Topics Concern   Not on file  Social History  Narrative   Not on file   Social Determinants of Health   Financial Resource Strain: Not on file  Food Insecurity: Not on file  Transportation Needs: Not on file  Physical Activity: Not on file  Stress: Not on file  Social Connections: Not on file  Intimate Partner Violence: Not on file    Family History  Problem Relation Age of Onset   Stroke Mother    Hypertension Mother    Alcoholism Mother    Alcoholism Father    Alcoholism Brother    Hypertension Brother    Hyperlipidemia Brother    Anemia Brother    Other Brother        bleeding hemorroids   Heart disease Brother        valve surgery   Heart attack Maternal Grandmother        heart valve surgery   Hyperlipidemia Maternal Grandmother    Hypertension Maternal Grandmother     Current Facility-Administered Medications  Medication Dose Route Frequency Provider Last Rate Last Admin   0.9 %  sodium chloride infusion   Intravenous Q8H PRN Michael Boston, MD       0.9 %  sodium chloride infusion  250 mL Intravenous PRN Michael Boston, MD       acetaminophen (TYLENOL) suppository 650 mg  650 mg Rectal Q6H PRN Michael Boston, MD       acetaminophen (TYLENOL) tablet 325-650 mg  325-650 mg Oral Q6H PRN Michael Boston, MD       albumin human 5 % solution 12.5 g  12.5 g Intravenous Q6H PRN Michael Boston, MD       albuterol (PROVENTIL) (2.5 MG/3ML) 0.083% nebulizer solution 2.5 mg  2.5 mg Nebulization Q6H PRN Michael Boston, MD  ALPRAZolam Duanne Moron) tablet 0.5 mg  0.5 mg Oral QHS PRN Michael Boston, MD       B-complex with vitamin C tablet 1 tablet  1 tablet Oral Daily Michael Boston, MD       bisacodyl (DULCOLAX) suppository 10 mg  10 mg Rectal Daily PRN Michael Boston, MD       budesonide (PULMICORT) nebulizer solution 0.25 mg  0.25 mg Nebulization BID Michael Boston, MD       carvedilol (COREG) tablet 25 mg  25 mg Oral BID Michael Boston, MD   25 mg at 12/15/20 2114   celecoxib (CELEBREX) capsule 200 mg  200 mg Oral BID Michael Boston, MD   200 mg at 12/15/20 2114   chlorthalidone (HYGROTON) tablet 25 mg  25 mg Oral Daily Michael Boston, MD   25 mg at 12/15/20 1642   cholecalciferol (VITAMIN D3) tablet 2,000 Units  2,000 Units Oral Daily Michael Boston, MD       cyclobenzaprine (FLEXERIL) tablet 10 mg  10 mg Oral Ardeen Fillers, MD   10 mg at 12/15/20 2114   diphenhydrAMINE (BENADRYL) tablet 25 mg  25 mg Oral Q6H PRN Michael Boston, MD       DULoxetine (CYMBALTA) DR capsule 60 mg  60 mg Oral Daily Michael Boston, MD       enoxaparin (LOVENOX) injection 40 mg  40 mg Subcutaneous Q24H Michael Boston, MD       HYDROcodone-acetaminophen (NORCO/VICODIN) 5-325 MG per tablet 1 tablet  1 tablet Oral Q6H PRN Michael Boston, MD   1 tablet at 12/16/20 0252   irbesartan (AVAPRO) tablet 75 mg  75 mg Oral Daily Michael Boston, MD   75 mg at 12/15/20 1642   lip balm (CARMEX) ointment 1 application  1 application Topical BID Michael Boston, MD   1 application at 41/28/78 2252   magic mouthwash  15 mL Oral QID PRN Michael Boston, MD       magnesium hydroxide (MILK OF MAGNESIA) suspension 30 mL  30 mL Oral Daily PRN Michael Boston, MD       magnesium oxide (MAG-OX) tablet 200 mg  200 mg Oral Carlis Abbott, MD       methocarbamol (ROBAXIN) 1,000 mg in dextrose 5 % 100 mL IVPB  1,000 mg Intravenous Q6H PRN Michael Boston, MD       methocarbamol (ROBAXIN) tablet 1,000 mg  1,000 mg Oral Q6H PRN Michael Boston, MD       methocarbamol (ROBAXIN) tablet 500 mg  500 mg Oral Q6H PRN Michael Boston, MD       metoprolol tartrate (LOPRESSOR) injection 5 mg  5 mg Intravenous Q6H PRN Michael Boston, MD       pantoprazole (PROTONIX) EC tablet 80 mg  80 mg Oral Daily Michael Boston, MD       polycarbophil (FIBERCON) tablet 625 mg  625 mg Oral BID Michael Boston, MD   625 mg at 12/15/20 2114   polyethylene glycol (MIRALAX / GLYCOLAX) packet 17 g  17 g Oral Q12H PRN Michael Boston, MD       potassium chloride SA (KLOR-CON) CR tablet 20 mEq  20 mEq Oral Daily  Michael Boston, MD   20 mEq at 12/15/20 1715   pregabalin (LYRICA) capsule 75 mg  75 mg Oral BID Michael Boston, MD   75 mg at 12/15/20 2114   rosuvastatin (CRESTOR) tablet 10 mg  10 mg Oral Ardeen Fillers, MD   10 mg at 12/15/20 2114   [  START ON 12/19/2020] Semaglutide(0.25 or 0.5MG /DOS) SOPN 1 mg  1 mg Injection Weekly Campbell Kray, Remo Lipps, MD       simethicone [facility]) chewable tablet 40 mg  40 mg Oral Q6H PRN Michael Boston, MD       sodium chloride flush (NS) 0.9 % injection 3 mL  3 mL Intravenous Gorden Harms, MD   3 mL at 12/15/20 2252   sodium chloride flush (NS) 0.9 % injection 3 mL  3 mL Intravenous PRN Michael Boston, MD       SUMAtriptan (IMITREX) tablet 100 mg  100 mg Oral PRN Michael Boston, MD       traMADol Veatrice Bourbon) tablet 50 mg  50 mg Oral Q6H PRN Michael Boston, MD       vitamin B-12 (CYANOCOBALAMIN) tablet 5,000 mcg  5,000 mcg Oral Daily Michael Boston, MD         Allergies  Allergen Reactions   Penicillins     Joints ache   Aspirin Hives and Rash   Ibuprofen Hives and Rash   Naproxen Hives and Rash    Signed: Morton Peters, MD, FACS, MASCRS Esophageal, Gastrointestinal & Colorectal Surgery Robotic and Minimally Invasive Surgery  [facility] [address], [place], [place] [address] [phone] Fax [phone] Main/Paging  CONTACT INFORMATION: Weekday (9AM-5PM) concerns: Call [facility] main office at [phone] Weeknight (5PM-9AM) or Weekend/Holiday concerns: Check [email or web address] for General Surgery [facility] coverage (Please, do not use SecureChat as it is not reliable communication to operating surgeons for immediate patient care)      12/16/2020, 7:19 AM

## 2020-12-16 NOTE — Progress Notes (Signed)
SATURATION QUALIFICATIONS: (This note is used to comply with regulatory documentation for home oxygen)  Patient Saturations on Room Air at Rest = 95%  Patient Saturations on Room Air while Ambulating = 92-95%

## 2021-02-21 ENCOUNTER — Encounter: Payer: Self-pay | Admitting: Pulmonary Disease

## 2021-02-21 ENCOUNTER — Other Ambulatory Visit: Payer: Self-pay

## 2021-02-21 ENCOUNTER — Ambulatory Visit: Payer: Medicare PPO | Admitting: Pulmonary Disease

## 2021-02-21 VITALS — BP 114/68 | HR 83 | Temp 98.0°F | Ht 59.0 in | Wt 226.2 lb

## 2021-02-21 DIAGNOSIS — G4733 Obstructive sleep apnea (adult) (pediatric): Secondary | ICD-10-CM | POA: Diagnosis not present

## 2021-02-21 DIAGNOSIS — G4721 Circadian rhythm sleep disorder, delayed sleep phase type: Secondary | ICD-10-CM | POA: Diagnosis not present

## 2021-02-21 NOTE — Patient Instructions (Signed)
Trial of  small airfit F 30 full face mask CPAP is working well on auto settings

## 2021-02-21 NOTE — Assessment & Plan Note (Signed)
Previous attempts to face shift has not been successful.  she is resting much better with CPAP Advised daylight exposure in am

## 2021-02-21 NOTE — Assessment & Plan Note (Signed)
CPAP download was reviewed which shows excellent control of events with good compliance about 8 hours every night and no leak, average pressure is 14 cm on auto settings 10 to 16 cm  She reports pressure on the bridge of her nose with current fullface mask and I advised her to trial AirFit F 30 fullface mask  Weight loss encouraged, compliance with goal of at least 4-6 hrs every night is the expectation. Advised against medications with sedative side effects Cautioned against driving when sleepy - understanding that sleepiness will vary on a day to day basis

## 2021-02-21 NOTE — Progress Notes (Signed)
   Subjective:    Patient ID: Katie Owens, female    DOB: 04-24-1951, 70 y.o.   MRN: [id number]  HPI  70 year old retiree for FU of OSA & delayed sleep phase syndrome PMH -atrial fibrillation p-ablation, mild intermittent asthma, hypertension, breast cancer   Severe OSA was diagnosed more than 15 years ago, she initially used CPAP for a while and then stopped .  A titration study in 2010 had shown requirement of 16 cm of CPAP when her weight was 266 pounds. She was again tested in 2016 and showed an AHI of 30/hour.  She was reassessed in 2017 and again used CPAP for a little while before stopping.  We reassessed her with a home sleep test in January 2022 and started her on auto CPAP with a full facemask.  She has tolerated this well and reports good improvement in her daytime somnolence and fatigue She still has the late bedtime but feels like she rest for at least 7 to 8 hours This is objectively confirmed on her CPAP download     Significant tests/ events reviewed  07/2020 HST AHI 19/hour, lowest saturation 78% PSG 05/01/15 >> AHI 30.2, SpO2 low 86%   05/2009 CPAP titration-266 pounds-16 cm Previous sleep study showed AHI of 28  Review of Systems neg for any significant sore throat, dysphagia, itching, sneezing, nasal congestion or excess/ purulent secretions, fever, chills, sweats, unintended wt loss, pleuritic or exertional cp, hempoptysis, orthopnea pnd or change in chronic leg swelling. Also denies presyncope, palpitations, heartburn, abdominal pain, nausea, vomiting, diarrhea or change in bowel or urinary habits, dysuria,hematuria, rash, arthralgias, visual complaints, headache, numbness weakness or ataxia.      Objective:   Physical Exam  Gen. Pleasant, obese, in no distress ENT - no lesions, no post nasal drip Neck: No JVD, no thyromegaly, no carotid bruits Lungs: no use of accessory muscles, no dullness to percussion, decreased without rales or rhonchi   Cardiovascular: Rhythm regular, heart sounds  normal, no murmurs or gallops, no peripheral edema Musculoskeletal: No deformities, no cyanosis or clubbing , no tremors        Assessment & Plan:

## 2021-03-17 ENCOUNTER — Telehealth: Payer: Self-pay | Admitting: Pulmonary Disease

## 2021-03-17 NOTE — Telephone Encounter (Signed)
LMTCB

## 2021-03-27 NOTE — Telephone Encounter (Signed)
PCC's can we follow up on the order sent to [facility] on 8/23 for the cpap mask.  Pt stated that she has not heard from them.   thanks

## 2021-03-28 NOTE — Telephone Encounter (Signed)
I have faxed the order to the number requested

## 2021-04-21 ENCOUNTER — Other Ambulatory Visit: Payer: Self-pay | Admitting: Neurological Surgery

## 2021-04-21 DIAGNOSIS — M5416 Radiculopathy, lumbar region: Secondary | ICD-10-CM

## 2021-05-16 ENCOUNTER — Ambulatory Visit
Admission: RE | Admit: 2021-05-16 | Discharge: 2021-05-16 | Disposition: A | Discharge status: Home / Self Care | Payer: Medicare PPO | Source: Ambulatory Visit | Attending: Neurological Surgery | Admitting: Neurological Surgery

## 2021-05-16 DIAGNOSIS — M5416 Radiculopathy, lumbar region: Secondary | ICD-10-CM

## 2021-05-16 MED ORDER — GADOBENATE DIMEGLUMINE 529 MG/ML IV SOLN: 20.0000 mL | Freq: Once | INTRAVENOUS | Status: DC | PRN

## 2021-05-16 MED ORDER — GADOBENATE DIMEGLUMINE 529 MG/ML IV SOLN
20.0000 mL | Freq: Once | INTRAVENOUS | Status: AC | PRN
  Administered 2021-05-16: 20 mL via INTRAVENOUS

## 2021-05-31 ENCOUNTER — Other Ambulatory Visit: Payer: Self-pay | Admitting: Physician Assistant

## 2021-07-04 ENCOUNTER — Other Ambulatory Visit: Payer: Self-pay | Admitting: Neurological Surgery

## 2021-07-07 ENCOUNTER — Telehealth: Payer: Self-pay | Admitting: Internal Medicine

## 2021-07-07 NOTE — Telephone Encounter (Signed)
Pt ILR reached RRT in April of 2021. Pt would like return kit for monitor.

## 2021-07-07 NOTE — Telephone Encounter (Signed)
I verified the patient address and let her know she will receive her return kit in 7-10 business days.

## 2021-07-24 NOTE — Pre-Procedure Instructions (Signed)
Surgical Instructions    Your procedure is scheduled on Thursday, January 26th.  Report to [facility] Main Entrance "A" at 05:30 A.M., then check in with the Admitting office.  Call this number if you have problems the morning of surgery:  [phone]   If you have any questions prior to your surgery date call [phone]: Open Monday-Friday 8am-4pm    Remember:  Do not eat after midnight the night before your surgery  You may drink clear liquids until 04:30 AM the morning of your surgery.   Clear liquids allowed are: Water, Non-Citrus Juices (without pulp), Carbonated Beverages, Clear Tea, Black Coffee Only (NO MILK, CREAM OR POWDERED CREAMER of any kind), and Gatorade.    Take these medicines the morning of surgery with A SIP OF WATER  Brexpiprazole (REXULTI)  carvedilol (COREG) DULoxetine (CYMBALTA) HYDROcodone-acetaminophen (NORCO) omeprazole (PRILOSEC) pregabalin (LYRICA)  If needed: beclomethasone (QVAR) inhaler dicyclomine (BENTYL) diltiazem (CARDIZEM) SUMAtriptan (IMITREX)  VENTOLIN HFA inhaler  As of today, STOP taking any Aspirin (unless otherwise instructed by your surgeon) Aleve, Naproxen, Ibuprofen, Motrin, Advil, Goody's, BC's, all herbal medications, fish oil, and all vitamins. This includes your celecoxib (CELEBREX).     WHAT DO I DO ABOUT MY DIABETES MEDICATION?   The day of surgery, do not take other diabetes injectables, including OZEMPIC    HOW TO MANAGE YOUR DIABETES BEFORE AND AFTER SURGERY  Why is it important to control my blood sugar before and after surgery? Improving blood sugar levels before and after surgery helps healing and can limit problems. A way of improving blood sugar control is eating a healthy diet by:  Eating less sugar and carbohydrates  Increasing activity/exercise  Talking with your doctor about reaching your blood sugar goals High blood sugars (greater than 180 mg/dL) can raise your risk of infections and slow your  recovery, so you will need to focus on controlling your diabetes during the weeks before surgery. Make sure that the doctor who takes care of your diabetes knows about your planned surgery including the date and location.  How do I manage my blood sugar before surgery? Check your blood sugar at least 4 times a day, starting 2 days before surgery, to make sure that the level is not too high or low.  Check your blood sugar the morning of your surgery when you wake up and every 2 hours until you get to the Short Stay unit.  If your blood sugar is less than 70 mg/dL, you will need to treat for low blood sugar: Do not take insulin. Treat a low blood sugar (less than 70 mg/dL) with  cup of clear juice (cranberry or apple), 4 glucose tablets, OR glucose gel. Recheck blood sugar in 15 minutes after treatment (to make sure it is greater than 70 mg/dL). If your blood sugar is not greater than 70 mg/dL on recheck, call [phone] for further instructions. Report your blood sugar to the short stay nurse when you get to Short Stay.  If you are admitted to the hospital after surgery: Your blood sugar will be checked by the staff and you will probably be given insulin after surgery (instead of oral diabetes medicines) to make sure you have good blood sugar levels. The goal for blood sugar control after surgery is 80-180 mg/dL.                    Do NOT Smoke (Tobacco/Vaping) for 24 hours prior to your procedure.  If you use a CPAP  at night, you may bring your mask/headgear for your overnight stay.   Contacts, glasses, piercing's, hearing aid's, dentures or partials may not be worn into surgery, please bring cases for these belongings.    For patients admitted to the hospital, discharge time will be determined by your treatment team.   Patients discharged the day of surgery will not be allowed to drive home, and someone needs to stay with them for 24 hours.  NO VISITORS WILL BE ALLOWED IN PRE-OP WHERE  PATIENTS ARE PREPPED FOR SURGERY.  ONLY 1 SUPPORT PERSON MAY BE PRESENT IN THE WAITING ROOM WHILE YOU ARE IN SURGERY.  IF YOU ARE TO BE ADMITTED, ONCE YOU ARE IN YOUR ROOM YOU WILL BE ALLOWED TWO (2) VISITORS. (1) VISITOR MAY STAY OVERNIGHT BUT MUST ARRIVE TO THE ROOM BY 8pm.  Minor children may have two parents present. Special consideration for safety and communication needs will be reviewed on a case by case basis.   Special instructions:   [place]- Preparing For Surgery  Before surgery, you can play an important role. Because skin is not sterile, your skin needs to be as free of germs as possible. You can reduce the number of germs on your skin by washing with CHG (chlorahexidine gluconate) Soap before surgery.  CHG is an antiseptic cleaner which kills germs and bonds with the skin to continue killing germs even after washing.    Oral Hygiene is also important to reduce your risk of infection.  Remember - BRUSH YOUR TEETH THE MORNING OF SURGERY WITH YOUR REGULAR TOOTHPASTE  Please do not use if you have an allergy to CHG or antibacterial soaps. If your skin becomes reddened/irritated stop using the CHG.  Do not shave (including legs and underarms) for at least 48 hours prior to first CHG shower. It is OK to shave your face.  Please follow these instructions carefully.   Shower the NIGHT BEFORE SURGERY and the MORNING OF SURGERY  If you chose to wash your hair, wash your hair first as usual with your normal shampoo.  After you shampoo, rinse your hair and body thoroughly to remove the shampoo.  Use CHG Soap as you would any other liquid soap. You can apply CHG directly to the skin and wash gently with a scrungie or a clean washcloth.   Apply the CHG Soap to your body ONLY FROM THE NECK DOWN.  Do not use on open wounds or open sores. Avoid contact with your eyes, ears, mouth and genitals (private parts). Wash Face and genitals (private parts)  with your normal soap.   Wash thoroughly,  paying special attention to the area where your surgery will be performed.  Thoroughly rinse your body with warm water from the neck down.  DO NOT shower/wash with your normal soap after using and rinsing off the CHG Soap.  Pat yourself dry with a CLEAN TOWEL.  Wear CLEAN PAJAMAS to bed the night before surgery  Place CLEAN SHEETS on your bed the night before your surgery  DO NOT SLEEP WITH PETS.   Day of Surgery: Shower with CHG soap. Do not wear jewelry, make up, nail polish, gel polish, artificial nails, or any other type of covering on natural nails including finger and toenails. If patients have artificial nails, gel coating, etc. that need to be removed by a nail salon please have this removed prior to surgery. Surgery may need to be canceled/delayed if the surgeon/anesthesiologist feels like the patient is unable to be adequately  monitored. Do not wear lotions, powders, perfumes, or deodorant. Do not shave 48 hours prior to surgery.   Do not bring valuables to the hospital. [facility] is not responsible for any belongings or valuables. Wear Clean/Comfortable clothing the morning of surgery Remember to brush your teeth WITH YOUR REGULAR TOOTHPASTE.   Please read over the following fact sheets that you were given.   3 days prior to your procedure or After your COVID test   You are not required to quarantine however you are required to wear a well-fitting mask when you are out and around people not in your household. If your mask becomes wet or soiled, replace with a new one.   Wash your hands often with soap and water for 20 seconds or clean your hands with an alcohol-based hand sanitizer that contains at least 60% alcohol.   Do not share personal items.   Notify your provider:  o if you are in close contact with someone who has COVID  o or if you develop a fever of 100.4 or greater, sneezing, cough, sore throat, shortness of breath or body aches.

## 2021-07-25 ENCOUNTER — Encounter (HOSPITAL_COMMUNITY): Payer: Self-pay

## 2021-07-25 ENCOUNTER — Other Ambulatory Visit: Payer: Self-pay

## 2021-07-25 ENCOUNTER — Encounter (HOSPITAL_COMMUNITY)
Admission: RE | Admit: 2021-07-25 | Discharge: 2021-07-25 | Disposition: A | Discharge status: Home / Self Care | Payer: Medicare HMO | Source: Ambulatory Visit | Attending: Neurological Surgery | Admitting: Neurological Surgery

## 2021-07-25 VITALS — BP 142/64 | HR 88 | Temp 98.7°F | Resp 19 | Ht 59.0 in | Wt 230.0 lb

## 2021-07-25 DIAGNOSIS — R0600 Dyspnea, unspecified: Secondary | ICD-10-CM | POA: Insufficient documentation

## 2021-07-25 DIAGNOSIS — Z923 Personal history of irradiation: Secondary | ICD-10-CM | POA: Insufficient documentation

## 2021-07-25 DIAGNOSIS — Z20822 Contact with and (suspected) exposure to covid-19: Secondary | ICD-10-CM | POA: Insufficient documentation

## 2021-07-25 DIAGNOSIS — Z9049 Acquired absence of other specified parts of digestive tract: Secondary | ICD-10-CM | POA: Insufficient documentation

## 2021-07-25 DIAGNOSIS — Z6841 Body Mass Index (BMI) 40.0 and over, adult: Secondary | ICD-10-CM | POA: Insufficient documentation

## 2021-07-25 DIAGNOSIS — E785 Hyperlipidemia, unspecified: Secondary | ICD-10-CM | POA: Insufficient documentation

## 2021-07-25 DIAGNOSIS — N183 Chronic kidney disease, stage 3 unspecified: Secondary | ICD-10-CM | POA: Insufficient documentation

## 2021-07-25 DIAGNOSIS — M4316 Spondylolisthesis, lumbar region: Secondary | ICD-10-CM | POA: Insufficient documentation

## 2021-07-25 DIAGNOSIS — I2510 Atherosclerotic heart disease of native coronary artery without angina pectoris: Secondary | ICD-10-CM

## 2021-07-25 DIAGNOSIS — M797 Fibromyalgia: Secondary | ICD-10-CM | POA: Insufficient documentation

## 2021-07-25 DIAGNOSIS — J45909 Unspecified asthma, uncomplicated: Secondary | ICD-10-CM | POA: Insufficient documentation

## 2021-07-25 DIAGNOSIS — Z87891 Personal history of nicotine dependence: Secondary | ICD-10-CM | POA: Insufficient documentation

## 2021-07-25 DIAGNOSIS — K589 Irritable bowel syndrome without diarrhea: Secondary | ICD-10-CM | POA: Insufficient documentation

## 2021-07-25 DIAGNOSIS — E1122 Type 2 diabetes mellitus with diabetic chronic kidney disease: Secondary | ICD-10-CM | POA: Insufficient documentation

## 2021-07-25 DIAGNOSIS — Z01818 Encounter for other preprocedural examination: Secondary | ICD-10-CM

## 2021-07-25 DIAGNOSIS — E119 Type 2 diabetes mellitus without complications: Secondary | ICD-10-CM

## 2021-07-25 DIAGNOSIS — Z95818 Presence of other cardiac implants and grafts: Secondary | ICD-10-CM | POA: Insufficient documentation

## 2021-07-25 DIAGNOSIS — I4891 Unspecified atrial fibrillation: Secondary | ICD-10-CM | POA: Insufficient documentation

## 2021-07-25 DIAGNOSIS — Z9221 Personal history of antineoplastic chemotherapy: Secondary | ICD-10-CM | POA: Insufficient documentation

## 2021-07-25 DIAGNOSIS — I129 Hypertensive chronic kidney disease with stage 1 through stage 4 chronic kidney disease, or unspecified chronic kidney disease: Secondary | ICD-10-CM | POA: Insufficient documentation

## 2021-07-25 DIAGNOSIS — Z9989 Dependence on other enabling machines and devices: Secondary | ICD-10-CM | POA: Insufficient documentation

## 2021-07-25 DIAGNOSIS — Z01812 Encounter for preprocedural laboratory examination: Secondary | ICD-10-CM | POA: Insufficient documentation

## 2021-07-25 DIAGNOSIS — E669 Obesity, unspecified: Secondary | ICD-10-CM | POA: Insufficient documentation

## 2021-07-25 DIAGNOSIS — G4733 Obstructive sleep apnea (adult) (pediatric): Secondary | ICD-10-CM | POA: Insufficient documentation

## 2021-07-25 DIAGNOSIS — I493 Ventricular premature depolarization: Secondary | ICD-10-CM | POA: Insufficient documentation

## 2021-07-25 DIAGNOSIS — Z853 Personal history of malignant neoplasm of breast: Secondary | ICD-10-CM | POA: Insufficient documentation

## 2021-07-25 DIAGNOSIS — K219 Gastro-esophageal reflux disease without esophagitis: Secondary | ICD-10-CM | POA: Insufficient documentation

## 2021-07-25 DIAGNOSIS — K760 Fatty (change of) liver, not elsewhere classified: Secondary | ICD-10-CM | POA: Insufficient documentation

## 2021-07-25 LAB — CBC
HCT: 39.1 % (ref 36.0–46.0)
Hemoglobin: 13 g/dL (ref 12.0–15.0)
MCH: 29.3 pg (ref 26.0–34.0)
MCHC: 33.2 g/dL (ref 30.0–36.0)
MCV: 88.1 fL (ref 80.0–100.0)
Platelets: 176 10*3/uL (ref 150–400)
RBC: 4.44 MIL/uL (ref 3.87–5.11)
RDW: 13.2 % (ref 11.5–15.5)
WBC: 5 10*3/uL (ref 4.0–10.5)
nRBC: 0 % (ref 0.0–0.2)

## 2021-07-25 LAB — SURGICAL PCR SCREEN
MRSA, PCR: NEGATIVE
Staphylococcus aureus: NEGATIVE

## 2021-07-25 LAB — COMPREHENSIVE METABOLIC PANEL
ALT: 44 U/L (ref 0–44)
AST: 74 U/L — ABNORMAL HIGH (ref 15–41)
Albumin: 3.9 g/dL (ref 3.5–5.0)
Alkaline Phosphatase: 100 U/L (ref 38–126)
Anion gap: 13 (ref 5–15)
BUN: 15 mg/dL (ref 8–23)
CO2: 23 mmol/L (ref 22–32)
Calcium: 9.6 mg/dL (ref 8.9–10.3)
Chloride: 99 mmol/L (ref 98–111)
Creatinine, Ser: 1.07 mg/dL — ABNORMAL HIGH (ref 0.44–1.00)
GFR, Estimated: 56 mL/min — ABNORMAL LOW (ref >60)
Glucose, Bld: 229 mg/dL — ABNORMAL HIGH (ref 70–99)
Potassium: 4.2 mmol/L (ref 3.5–5.1)
Sodium: 135 mmol/L (ref 135–145)
Total Bilirubin: 0.4 mg/dL (ref 0.3–1.2)
Total Protein: 7.1 g/dL (ref 6.5–8.1)

## 2021-07-25 LAB — TYPE AND SCREEN
ABO/RH(D): A POS
Antibody Screen: NEGATIVE

## 2021-07-25 LAB — HEMOGLOBIN A1C
Hgb A1c MFr Bld: 7.6 % — ABNORMAL HIGH (ref 4.8–5.6)
Mean Plasma Glucose: 171 mg/dL

## 2021-07-25 LAB — GLUCOSE, CAPILLARY: Glucose-Capillary: 283 mg/dL — ABNORMAL HIGH (ref 70–99)

## 2021-07-25 NOTE — Progress Notes (Signed)
PCP - Dr. Derinda Late Endocrinologist-Dr. Kathlene Cote Cardiologist - Dr. [name] Pulm-Dr. Elsworth Soho  PPM/ICD - n/a  Pt has a battery implanted of a deactivated loop recorder.   Chest x-ray - n/a EKG - 09/06/20 Stress Test - 10+ years ago ECHO - 10/03/20 Cardiac Cath -01/24/09   Sleep Study - OSA+ CPAP - uses nightly   Fasting Blood Sugar - 140-150 Checks Blood Sugar 3 times a week CBG at PAT 283. Pt states she ate a bagel with butter at 1200. Last A1C reported to be 6.7. Will collect A1C today.  Blood Thinner Instructions: n/s Aspirin Instructions:n/a  ERAS Protcol -Clear liquids until 0430 DOS PRE-SURGERY Ensure or G2- none ordered.   COVID TEST- 07/25/21; done in PAT  Anesthesia review: Yes, medical history.   Patient denies shortness of breath, fever, cough and chest pain at PAT appointment   All instructions explained to the patient, with a verbal understanding of the material. Patient agrees to go over the instructions while at home for a better understanding. Patient also instructed to self quarantine after being tested for COVID-19. The opportunity to ask questions was provided.

## 2021-07-26 ENCOUNTER — Encounter (HOSPITAL_COMMUNITY): Payer: Self-pay

## 2021-07-26 LAB — SARS CORONAVIRUS 2 (TAT 6-24 HRS): SARS Coronavirus 2: NEGATIVE

## 2021-07-26 MED ORDER — VANCOMYCIN HCL 1500 MG/300ML IV SOLN
1500.0000 mg | INTRAVENOUS | Status: AC
  Administered 2021-07-27: 1500 mg via INTRAVENOUS
  Filled 2021-07-26: qty 300

## 2021-07-26 NOTE — Progress Notes (Signed)
Anesthesia Chart Review:  Case: [id number] Date/Time: 07/27/21 0715   Procedure: L3-4 PLIF (Back) - 3C/RM 21   Anesthesia type: General   Pre-op diagnosis: Spondylolisthesis   Location: [facility] OR ROOM 21 / [place] OR   Surgeons: Kristeen Miss, MD       DISCUSSION: Patient is a 71 year old female scheduled for the above procedure.  History includes former smoker (quit 10/08/79), post-operative N/V, HTN, HLD, afib (s/p ablation in 2012 @ [facility] by Dr. Clyda Hurdle; loop recorder implant 03/08/16, RRT since 10/2019, patient preferred to leave device in 09/06/20), dyspnea, asthma, OSA (moderate OSA w/ AHI 19/hr 07/30/20, uses CPAP), DM2, CKD (stage III), fatty liver, GERD, fibromyalgia, breast cancer (left, s/p lumpectomy with LN dissection x 12, chemoradiation 1995), IBS, cholecystectomy (12/15/20 with liver biopsy {mildly active steatohepatitis grade 1 of 3, mild fibrosis stage 1-2 or 4] and abdominal wall lipoma excision), spinal surgery (C3-4 surgery ~ 2015; L4-5 TLIF 11/08/09). BMI is consistent with obesity.   Last EP visit 09/06/20 with Tommye Standard, PA-C for follow-up and preoperative evaluation for cholecystectomy which she had in June 2022. Echo updated at that time. She had afib ablation in 2012. No known definite recurrence since then, although likely brief atrial tachycardia in late 2017. ILD placed in 2018. She opted to stop Xarelto after ILD showed no afib after 3 months. Loop recorder at end of life since 10/2019, but patient opted to leave the device implanted. She has known occasional PVCs which cause infrequent palpitations.   07/25/21 presurgical COVID-19 test is in process. Anesthesia team to evaluate on the day of surgery.   VS: BP (!) 142/64    Pulse 88    Temp 37.1 C    Resp 19    Ht 4' 11"  (1.499 m)    Wt 104.3 kg    SpO2 98%    BMI 46.45 kg/m    PROVIDERS: Derinda Late, MD is PCP  Kara Mead, MD is pulmonologist Kathlene Cote, MD is endocrinologist Thompson Grayer, MD is EP cardiologist     LABS: Labs reviewed: Acceptable for surgery. AST 74 with normal ALT, total bili, Alk Phos, and PLT count. No recent comparison LFTs. Known fatty liver disease.  (all labs ordered are listed, but only abnormal results are displayed)  Labs Reviewed  GLUCOSE, CAPILLARY - Abnormal; Notable for the following components:      Result Value   Glucose-Capillary 283 (*)    All other components within normal limits  COMPREHENSIVE METABOLIC PANEL - Abnormal; Notable for the following components:   Glucose, Bld 229 (*)    Creatinine, Ser 1.07 (*)    AST 74 (*)    GFR, Estimated 56 (*)    All other components within normal limits  HEMOGLOBIN A1C - Abnormal; Notable for the following components:   Hgb A1c MFr Bld 7.6 (*)    All other components within normal limits  SURGICAL PCR SCREEN  SARS CORONAVIRUS 2 (TAT 6-24 HRS)  CBC  TYPE AND SCREEN     IMAGES: MRI L-spine 05/16/21: IMPRESSION: 1. Significant interval progression of degenerative changes at L3-4 with new anterolisthesis resulting in severe spinal canal stenosis and moderate bilateral foraminal narrowing at this level. 2. Stable mild spinal canal stenosis with narrowing of the left subarticular zone at L4-5. 3. Mild narrowing of the right subarticular zone and moderate bilateral neural foraminal narrowing at L5-S1. 4. Mild spinal canal stenosis, mild right and severe left neural foraminal narrowing at T11-12.      EKG: 09/06/20:  SR with PVCs   CV: Echo 10/03/20: IMPRESSIONS   1. Left ventricular ejection fraction, by estimation, is 50 to 55%. The  left ventricle has low normal function. The left ventricle has no regional  wall motion abnormalities. Left ventricular diastolic parameters are  indeterminate.   2. Right ventricular systolic function is normal. The right ventricular  size is normal. Mildly increased right ventricular wall thickness.   3. The mitral valve is normal in structure. Trivial mitral valve   regurgitation.   4. The aortic valve is normal in structure. Aortic valve regurgitation is  not visualized.   5. The inferior vena cava is normal in size with greater than 50%  respiratory variability, suggesting right atrial pressure of 3 mmHg.    Cardiac event monitor 10/11/15-11/09/15: No evidence of atrial fibrillation Occasional PVCs/sinus tachycardia associated with symptoms of flutter/palpitations Brief 10 beat run of paroxysmal atrial tachycardia    Cardiac cath 01/24/09: Summary: 1.  Normal coronary arteries without evidence of angiographically significant disease. 2.  Normal left ventricular function. 3.  No significant aortic gradient.  No significant mitral regurgitation.   Past Medical History:  Diagnosis Date   Abdominal wall mass 12/09/2020   Arthritis 12/09/2020   oa   Asthma    Cancer ([place]) 07/02/1993   breast cancer lt with 12 nodes out, chemo and radiation done   Chronic cholecystitis 12/09/2020   chronic kidney disease 12/09/2020   stage 3 per pt   Contact lens/glasses fitting    wears contacts or glasses   COVID    jan  2022 cold and headache x 7 -8 days/all symptoms resolved per pt   Depression    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   Dysrhythmia    hx AF had ablation in 2012, has occ pvc's per pt   Fatty liver    Fibromyalgia    GERD (gastroesophageal reflux disease)    History of kidney stones    passed on own in past per pt   Hyperlipemia    Hypertension    IBS (irritable bowel syndrome)    migraines 12/09/2020   with weather changes per pt   PONV (postoperative nausea and vomiting)    Sciatica 12/09/2020   right butt cheek and right leg and lower spine   Seasonal allergies    Shortness of breath 12/09/2020   on exertion   Sleep apnea    cpap broken waiting on new cpap, severe osa last sleep study home few months ago ( 12-09-2020 )   Uses walker 12/09/2020   left walker with friend out of the area   Wears glasses 12/09/2020    Past Surgical  History:  Procedure Laterality Date   BREAST LUMPECTOMY WITH AXILLARY LYMPH NODE DISSECTION  07/02/1993   12 nodes removed   CARDIAC CATHETERIZATION  01/24/2009   left heart   CARDIAC ELECTROPHYSIOLOGY MAPPING AND ABLATION  07/02/2010   CARPAL TUNNEL RELEASE Right 10/14/2012   Procedure: CARPAL TUNNEL RELEASE;  Surgeon: Wynonia Sours, MD;  Location: [facility];  Service: Orthopedics;  Laterality: Right;   CHOLECYSTECTOMY N/A 12/15/2020   Procedure: LAPAROSCOPIC CHOLECYSTECTOMY WITH INTRAOPERATIVE CHOLANGIOGRAM, TAP BLOCK;  Surgeon: Michael Boston, MD;  Location: [facility];  Service: General;  Laterality: N/A;   COLONOSCOPY  2021   DIAGNOSTIC LAPAROSCOPY  07/02/1988   EP IMPLANTABLE DEVICE N/A 03/08/2016   Procedure: Loop Recorder Insertion;  Surgeon: Thompson Grayer, MD;  Location: [facility];  Service: Cardiovascular;  Laterality: N/A;  LIVER BIOPSY N/A 12/15/2020   Procedure: NEEDLE CORE LIVER BIOPSY;  Surgeon: Michael Boston, MD;  Location: [facility];  Service: General;  Laterality: N/A;   LUMBAR FUSION  2011   MASS EXCISION N/A 12/15/2020   Procedure: EXCISION MASS ABDOMINAL WALL;  Surgeon: Michael Boston, MD;  Location: [facility];  Service: General;  Laterality: N/A;   TONSILLECTOMY     age 1 per pt    MEDICATIONS:  ALPRAZolam (XANAX) 0.5 MG tablet   b complex vitamins tablet   beclomethasone (QVAR) 80 MCG/ACT inhaler   Biotin 5 MG CAPS   Brexpiprazole (REXULTI) 0.5 MG TABS   Calcium Citrate (CITRACAL PO)   carvedilol (COREG) 25 MG tablet   celecoxib (CELEBREX) 200 MG capsule   chlorthalidone (HYGROTON) 25 MG tablet   Cholecalciferol (VITAMIN D) 50 MCG (2000 UT) tablet   cyclobenzaprine (FLEXERIL) 10 MG tablet   dicyclomine (BENTYL) 20 MG tablet   diltiazem (CARDIZEM) 30 MG tablet   diphenhydrAMINE (BENADRYL) 25 MG tablet   DULoxetine (CYMBALTA) 60 MG capsule   HYDROcodone-acetaminophen (NORCO) 5-325 MG per  tablet   loperamide (IMODIUM) 2 MG capsule   magnesium oxide (MAG-OX) 400 MG tablet   Multiple Vitamins-Minerals (MULTIVITAMIN ADULT PO)   omeprazole (PRILOSEC) 20 MG capsule   OZEMPIC, 0.25 OR 0.5 MG/DOSE, 2 MG/1.5ML SOPN   potassium chloride SA (K-DUR,KLOR-CON) 20 MEQ tablet   pregabalin (LYRICA) 75 MG capsule   rosuvastatin (CRESTOR) 10 MG tablet   SUMAtriptan (IMITREX) 100 MG tablet   telmisartan (MICARDIS) 40 MG tablet   VENTOLIN HFA 108 (90 BASE) MCG/ACT inhaler   No current facility-administered medications for this encounter.    Myra Gianotti, PA-C Surgical Short Stay/Anesthesiology [facility] Phone [phone] [facility] Phone [phone] 07/26/2021 10:50 AM

## 2021-07-26 NOTE — Anesthesia Preprocedure Evaluation (Addendum)
Anesthesia Evaluation  Patient identified by MRN, date of birth, ID band Patient awake    Reviewed: Allergy & Precautions, NPO status , Patient's Chart, lab work & pertinent test results  History of Anesthesia Complications (+) PONV  Airway Mallampati: II  TM Distance: >3 FB Neck ROM: Full    Dental  (+) Dental Advisory Given   Pulmonary sleep apnea and Continuous Positive Airway Pressure Ventilation , COPD,  COPD inhaler, former smoker,    breath sounds clear to auscultation       Cardiovascular hypertension, (-) angina(-) CAD + dysrhythmias (s/p successful ablation) Atrial Fibrillation  Rhythm:Regular Rate:Normal  '22 ECHO: EF 50-55%, normal LVF, normal RVF, no significant valvular abnormalities   Neuro/Psych  Headaches, Chronic back pain: walker    GI/Hepatic Neg liver ROS, GERD  Controlled and Medicated,  Endo/Other  diabetes (glu 185), Oral Hypoglycemic AgentsMorbid obesity  Renal/GU Renal InsufficiencyRenal disease     Musculoskeletal   Abdominal (+) + obese,   Peds  Hematology negative hematology ROS (+)   Anesthesia Other Findings H/o breast cancer  Reproductive/Obstetrics                           Anesthesia Physical Anesthesia Plan  ASA: 3  Anesthesia Plan: General   Post-op Pain Management: Tylenol PO (pre-op)   Induction: Intravenous  PONV Risk Score and Plan: 3 and Ondansetron, Dexamethasone and Treatment may vary due to age or medical condition  Airway Management Planned: Oral ETT  Additional Equipment: None  Intra-op Plan:   Post-operative Plan: Extubation in OR  Informed Consent: I have reviewed the patients History and Physical, chart, labs and discussed the procedure including the risks, benefits and alternatives for the proposed anesthesia with the patient or authorized representative who has indicated his/her understanding and acceptance.     Dental  advisory given  Plan Discussed with: CRNA and Surgeon  Anesthesia Plan Comments: (PAT note written 07/26/2021 by Myra Gianotti, PA-C. )      Anesthesia Quick Evaluation

## 2021-07-27 ENCOUNTER — Ambulatory Visit (HOSPITAL_COMMUNITY): Payer: Medicare HMO

## 2021-07-27 ENCOUNTER — Other Ambulatory Visit: Payer: Self-pay

## 2021-07-27 ENCOUNTER — Ambulatory Visit (HOSPITAL_COMMUNITY): Payer: Medicare HMO | Admitting: Anesthesiology

## 2021-07-27 ENCOUNTER — Ambulatory Visit (HOSPITAL_COMMUNITY): Payer: Medicare HMO | Admitting: Vascular Surgery

## 2021-07-27 ENCOUNTER — Inpatient Hospital Stay (HOSPITAL_COMMUNITY)
Admission: AD | Admit: 2021-07-27 | Discharge: 2021-07-29 | DRG: 454 | Disposition: A | Discharge status: Home Health | Payer: Medicare HMO | Attending: Neurological Surgery | Admitting: Neurological Surgery

## 2021-07-27 ENCOUNTER — Encounter (HOSPITAL_COMMUNITY): Payer: Self-pay | Admitting: Neurological Surgery

## 2021-07-27 ENCOUNTER — Encounter (HOSPITAL_COMMUNITY)
Admission: AD | Disposition: A | Discharge status: Home Health | Payer: Self-pay | Source: Home / Self Care | Attending: Neurological Surgery

## 2021-07-27 DIAGNOSIS — Z8616 Personal history of COVID-19: Secondary | ICD-10-CM

## 2021-07-27 DIAGNOSIS — Z886 Allergy status to analgesic agent status: Secondary | ICD-10-CM | POA: Diagnosis not present

## 2021-07-27 DIAGNOSIS — Z7985 Long-term (current) use of injectable non-insulin antidiabetic drugs: Secondary | ICD-10-CM | POA: Diagnosis not present

## 2021-07-27 DIAGNOSIS — L500 Allergic urticaria: Secondary | ICD-10-CM | POA: Diagnosis not present

## 2021-07-27 DIAGNOSIS — M48062 Spinal stenosis, lumbar region with neurogenic claudication: Secondary | ICD-10-CM | POA: Diagnosis present

## 2021-07-27 DIAGNOSIS — Z87442 Personal history of urinary calculi: Secondary | ICD-10-CM

## 2021-07-27 DIAGNOSIS — K219 Gastro-esophageal reflux disease without esophagitis: Secondary | ICD-10-CM | POA: Diagnosis present

## 2021-07-27 DIAGNOSIS — Z853 Personal history of malignant neoplasm of breast: Secondary | ICD-10-CM | POA: Diagnosis not present

## 2021-07-27 DIAGNOSIS — Z87891 Personal history of nicotine dependence: Secondary | ICD-10-CM

## 2021-07-27 DIAGNOSIS — I129 Hypertensive chronic kidney disease with stage 1 through stage 4 chronic kidney disease, or unspecified chronic kidney disease: Secondary | ICD-10-CM | POA: Diagnosis present

## 2021-07-27 DIAGNOSIS — Z79899 Other long term (current) drug therapy: Secondary | ICD-10-CM

## 2021-07-27 DIAGNOSIS — K589 Irritable bowel syndrome without diarrhea: Secondary | ICD-10-CM | POA: Diagnosis present

## 2021-07-27 DIAGNOSIS — N183 Chronic kidney disease, stage 3 unspecified: Secondary | ICD-10-CM | POA: Diagnosis present

## 2021-07-27 DIAGNOSIS — Z83438 Family history of other disorder of lipoprotein metabolism and other lipidemia: Secondary | ICD-10-CM

## 2021-07-27 DIAGNOSIS — G43909 Migraine, unspecified, not intractable, without status migrainosus: Secondary | ICD-10-CM | POA: Diagnosis present

## 2021-07-27 DIAGNOSIS — Z6841 Body Mass Index (BMI) 40.0 and over, adult: Secondary | ICD-10-CM

## 2021-07-27 DIAGNOSIS — Z419 Encounter for procedure for purposes other than remedying health state, unspecified: Secondary | ICD-10-CM

## 2021-07-27 DIAGNOSIS — T402X5A Adverse effect of other opioids, initial encounter: Secondary | ICD-10-CM | POA: Diagnosis not present

## 2021-07-27 DIAGNOSIS — J45909 Unspecified asthma, uncomplicated: Secondary | ICD-10-CM | POA: Diagnosis present

## 2021-07-27 DIAGNOSIS — I493 Ventricular premature depolarization: Secondary | ICD-10-CM | POA: Diagnosis present

## 2021-07-27 DIAGNOSIS — Z880 Allergy status to penicillin: Secondary | ICD-10-CM

## 2021-07-27 DIAGNOSIS — G4730 Sleep apnea, unspecified: Secondary | ICD-10-CM | POA: Diagnosis present

## 2021-07-27 DIAGNOSIS — Z20822 Contact with and (suspected) exposure to covid-19: Secondary | ICD-10-CM | POA: Diagnosis present

## 2021-07-27 DIAGNOSIS — E1122 Type 2 diabetes mellitus with diabetic chronic kidney disease: Secondary | ICD-10-CM | POA: Diagnosis present

## 2021-07-27 DIAGNOSIS — Z823 Family history of stroke: Secondary | ICD-10-CM

## 2021-07-27 DIAGNOSIS — E669 Obesity, unspecified: Secondary | ICD-10-CM | POA: Diagnosis present

## 2021-07-27 DIAGNOSIS — M5416 Radiculopathy, lumbar region: Secondary | ICD-10-CM | POA: Diagnosis present

## 2021-07-27 DIAGNOSIS — E785 Hyperlipidemia, unspecified: Secondary | ICD-10-CM | POA: Diagnosis present

## 2021-07-27 DIAGNOSIS — M4316 Spondylolisthesis, lumbar region: Secondary | ICD-10-CM | POA: Diagnosis present

## 2021-07-27 DIAGNOSIS — Z8249 Family history of ischemic heart disease and other diseases of the circulatory system: Secondary | ICD-10-CM

## 2021-07-27 LAB — GLUCOSE, CAPILLARY
Glucose-Capillary: 185 mg/dL — ABNORMAL HIGH (ref 70–99)
Glucose-Capillary: 178 mg/dL — ABNORMAL HIGH (ref 70–99)
Glucose-Capillary: 192 mg/dL — ABNORMAL HIGH (ref 70–99)
Glucose-Capillary: 147 mg/dL — ABNORMAL HIGH (ref 70–99)

## 2021-07-27 SURGERY — POSTERIOR LUMBAR FUSION 1 LEVEL
Anesthesia: General | Site: Spine Lumbar

## 2021-07-27 MED ORDER — ALUM & MAG HYDROXIDE-SIMETH 200-200-20 MG/5ML PO SUSP: 30.0000 mL | Freq: Four times a day (QID) | ORAL | Status: DC | PRN

## 2021-07-27 MED ORDER — CHLORHEXIDINE GLUCONATE CLOTH 2 % EX PADS: 6.0000 | MEDICATED_PAD | Freq: Once | CUTANEOUS | Status: DC

## 2021-07-27 MED ORDER — SUGAMMADEX SODIUM 200 MG/2ML IV SOLN
INTRAVENOUS | Status: DC | PRN
  Administered 2021-07-27: 200 mg via INTRAVENOUS

## 2021-07-27 MED ORDER — ONDANSETRON HCL 4 MG PO TABS
4.0000 mg | ORAL_TABLET | Freq: Four times a day (QID) | ORAL | Status: DC | PRN
  Administered 2021-07-28: 4 mg via ORAL
  Filled 2021-07-27: qty 1

## 2021-07-27 MED ORDER — FLEET ENEMA 7-19 GM/118ML RE ENEM: 1.0000 | ENEMA | Freq: Once | RECTAL | Status: DC | PRN

## 2021-07-27 MED ORDER — PREGABALIN 75 MG PO CAPS
75.0000 mg | ORAL_CAPSULE | Freq: Two times a day (BID) | ORAL | Status: DC
  Administered 2021-07-27 – 2021-07-28 (×3): 75 mg via ORAL
  Filled 2021-07-27 (×3): qty 1

## 2021-07-27 MED ORDER — THROMBIN 5000 UNITS EX SOLR
CUTANEOUS | Status: AC
  Filled 2021-07-27: qty 5000

## 2021-07-27 MED ORDER — LACTATED RINGERS IV SOLN: INTRAVENOUS | Status: DC

## 2021-07-27 MED ORDER — SODIUM CHLORIDE 0.9% FLUSH: 3.0000 mL | INTRAVENOUS | Status: DC | PRN

## 2021-07-27 MED ORDER — LOPERAMIDE HCL 2 MG PO CAPS: 2.0000 mg | ORAL_CAPSULE | ORAL | Status: DC | PRN

## 2021-07-27 MED ORDER — DILTIAZEM HCL 30 MG PO TABS
30.0000 mg | ORAL_TABLET | ORAL | Status: DC | PRN
  Filled 2021-07-27: qty 1

## 2021-07-27 MED ORDER — IRBESARTAN 150 MG PO TABS
150.0000 mg | ORAL_TABLET | Freq: Every day | ORAL | Status: DC
  Administered 2021-07-27 – 2021-07-28 (×2): 150 mg via ORAL
  Filled 2021-07-27 (×2): qty 1

## 2021-07-27 MED ORDER — ONDANSETRON HCL 4 MG/2ML IJ SOLN
INTRAMUSCULAR | Status: DC | PRN
  Administered 2021-07-27: 4 mg via INTRAVENOUS

## 2021-07-27 MED ORDER — LACTATED RINGERS IV SOLN: INTRAVENOUS | Status: DC | PRN

## 2021-07-27 MED ORDER — ORAL CARE MOUTH RINSE: 15.0000 mL | Freq: Once | OROMUCOSAL | Status: AC

## 2021-07-27 MED ORDER — SODIUM CHLORIDE 0.9% FLUSH: 3.0000 mL | Freq: Two times a day (BID) | INTRAVENOUS | Status: DC

## 2021-07-27 MED ORDER — MIDAZOLAM HCL 2 MG/2ML IJ SOLN
INTRAMUSCULAR | Status: AC
  Filled 2021-07-27: qty 2

## 2021-07-27 MED ORDER — CHLORTHALIDONE 25 MG PO TABS
25.0000 mg | ORAL_TABLET | Freq: Every day | ORAL | Status: DC
  Administered 2021-07-28: 25 mg via ORAL
  Filled 2021-07-27 (×2): qty 1

## 2021-07-27 MED ORDER — THROMBIN 20000 UNITS EX SOLR: CUTANEOUS | Status: DC | PRN

## 2021-07-27 MED ORDER — METHOCARBAMOL 500 MG PO TABS
500.0000 mg | ORAL_TABLET | Freq: Four times a day (QID) | ORAL | Status: DC | PRN
  Administered 2021-07-27 – 2021-07-29 (×4): 500 mg via ORAL
  Filled 2021-07-27 (×4): qty 1

## 2021-07-27 MED ORDER — BUPIVACAINE HCL (PF) 0.5 % IJ SOLN
INTRAMUSCULAR | Status: AC
  Filled 2021-07-27: qty 30

## 2021-07-27 MED ORDER — MORPHINE SULFATE (PF) 2 MG/ML IV SOLN
2.0000 mg | INTRAVENOUS | Status: DC | PRN
  Administered 2021-07-27: 2 mg via INTRAVENOUS
  Filled 2021-07-27: qty 1

## 2021-07-27 MED ORDER — ALBUTEROL SULFATE HFA 108 (90 BASE) MCG/ACT IN AERS: 1.0000 | INHALATION_SPRAY | Freq: Four times a day (QID) | RESPIRATORY_TRACT | Status: DC | PRN

## 2021-07-27 MED ORDER — HYDROMORPHONE HCL 1 MG/ML IJ SOLN
INTRAMUSCULAR | Status: AC
  Filled 2021-07-27: qty 1

## 2021-07-27 MED ORDER — DIPHENHYDRAMINE HCL 25 MG PO CAPS
25.0000 mg | ORAL_CAPSULE | Freq: Four times a day (QID) | ORAL | Status: DC | PRN
  Administered 2021-07-28: 25 mg via ORAL
  Filled 2021-07-27: qty 1

## 2021-07-27 MED ORDER — VANCOMYCIN HCL 750 MG/150ML IV SOLN
750.0000 mg | Freq: Once | INTRAVENOUS | Status: AC
  Administered 2021-07-27: 750 mg via INTRAVENOUS
  Filled 2021-07-27: qty 150

## 2021-07-27 MED ORDER — LIDOCAINE 2% (20 MG/ML) 5 ML SYRINGE
INTRAMUSCULAR | Status: AC
  Filled 2021-07-27: qty 5

## 2021-07-27 MED ORDER — DICYCLOMINE HCL 20 MG PO TABS
20.0000 mg | ORAL_TABLET | Freq: Three times a day (TID) | ORAL | Status: DC | PRN
  Filled 2021-07-27: qty 1

## 2021-07-27 MED ORDER — MAGNESIUM OXIDE -MG SUPPLEMENT 400 (240 MG) MG PO TABS
400.0000 mg | ORAL_TABLET | Freq: Every day | ORAL | Status: DC
  Administered 2021-07-28: 400 mg via ORAL
  Filled 2021-07-27: qty 1

## 2021-07-27 MED ORDER — CARVEDILOL 25 MG PO TABS
25.0000 mg | ORAL_TABLET | Freq: Two times a day (BID) | ORAL | Status: DC
  Administered 2021-07-27 – 2021-07-28 (×3): 25 mg via ORAL
  Filled 2021-07-27 (×3): qty 1

## 2021-07-27 MED ORDER — OXYCODONE HCL 5 MG/5ML PO SOLN: 5.0000 mg | Freq: Once | ORAL | Status: AC | PRN

## 2021-07-27 MED ORDER — 0.9 % SODIUM CHLORIDE (POUR BTL) OPTIME
TOPICAL | Status: DC | PRN
Start: 2021-07-27 — End: 2021-07-27
  Administered 2021-07-27: 1000 mL

## 2021-07-27 MED ORDER — HYDROCODONE-ACETAMINOPHEN 5-325 MG PO TABS
1.0000 | ORAL_TABLET | ORAL | Status: DC | PRN
  Administered 2021-07-27 – 2021-07-28 (×5): 2 via ORAL
  Filled 2021-07-27 (×5): qty 2

## 2021-07-27 MED ORDER — ONDANSETRON HCL 4 MG/2ML IJ SOLN: 4.0000 mg | Freq: Four times a day (QID) | INTRAMUSCULAR | Status: DC | PRN

## 2021-07-27 MED ORDER — POLYETHYLENE GLYCOL 3350 17 G PO PACK: 17.0000 g | PACK | Freq: Every day | ORAL | Status: DC | PRN

## 2021-07-27 MED ORDER — POTASSIUM CHLORIDE CRYS ER 20 MEQ PO TBCR
20.0000 meq | EXTENDED_RELEASE_TABLET | Freq: Every day | ORAL | Status: DC
  Administered 2021-07-28: 20 meq via ORAL
  Filled 2021-07-27: qty 1

## 2021-07-27 MED ORDER — MIDAZOLAM HCL 2 MG/2ML IJ SOLN
INTRAMUSCULAR | Status: DC | PRN
  Administered 2021-07-27: 2 mg via INTRAVENOUS

## 2021-07-27 MED ORDER — THROMBIN 5000 UNITS EX SOLR: OROMUCOSAL | Status: DC | PRN

## 2021-07-27 MED ORDER — SENNA 8.6 MG PO TABS
1.0000 | ORAL_TABLET | Freq: Two times a day (BID) | ORAL | Status: DC
  Administered 2021-07-27 – 2021-07-28 (×3): 8.6 mg via ORAL
  Filled 2021-07-27 (×3): qty 1

## 2021-07-27 MED ORDER — CHLORHEXIDINE GLUCONATE 0.12 % MT SOLN
15.0000 mL | Freq: Once | OROMUCOSAL | Status: AC
  Administered 2021-07-27: 15 mL via OROMUCOSAL
  Filled 2021-07-27: qty 15

## 2021-07-27 MED ORDER — LIDOCAINE-EPINEPHRINE 1 %-1:100000 IJ SOLN
INTRAMUSCULAR | Status: AC
  Filled 2021-07-27: qty 1

## 2021-07-27 MED ORDER — FENTANYL CITRATE (PF) 250 MCG/5ML IJ SOLN
INTRAMUSCULAR | Status: AC
  Filled 2021-07-27: qty 5

## 2021-07-27 MED ORDER — CYCLOBENZAPRINE HCL 10 MG PO TABS: 10.0000 mg | ORAL_TABLET | Freq: Every evening | ORAL | Status: DC | PRN

## 2021-07-27 MED ORDER — PHENOL 1.4 % MT LIQD: 1.0000 | OROMUCOSAL | Status: DC | PRN

## 2021-07-27 MED ORDER — SODIUM CHLORIDE 0.9 % IV SOLN: 250.0000 mL | INTRAVENOUS | Status: DC

## 2021-07-27 MED ORDER — PROPOFOL 10 MG/ML IV BOLUS
INTRAVENOUS | Status: AC
  Filled 2021-07-27: qty 20

## 2021-07-27 MED ORDER — DOCUSATE SODIUM 100 MG PO CAPS
100.0000 mg | ORAL_CAPSULE | Freq: Two times a day (BID) | ORAL | Status: DC
  Administered 2021-07-27 – 2021-07-28 (×3): 100 mg via ORAL
  Filled 2021-07-27 (×3): qty 1

## 2021-07-27 MED ORDER — OXYCODONE HCL 5 MG PO TABS
5.0000 mg | ORAL_TABLET | Freq: Once | ORAL | Status: AC | PRN
  Administered 2021-07-27: 5 mg via ORAL

## 2021-07-27 MED ORDER — THROMBIN 20000 UNITS EX SOLR
CUTANEOUS | Status: AC
  Filled 2021-07-27: qty 20000

## 2021-07-27 MED ORDER — PROMETHAZINE HCL 25 MG/ML IJ SOLN: 6.2500 mg | INTRAMUSCULAR | Status: DC | PRN

## 2021-07-27 MED ORDER — DULOXETINE HCL 30 MG PO CPEP
60.0000 mg | ORAL_CAPSULE | Freq: Every day | ORAL | Status: DC
  Administered 2021-07-28: 60 mg via ORAL
  Filled 2021-07-27: qty 2

## 2021-07-27 MED ORDER — SUMATRIPTAN SUCCINATE 100 MG PO TABS: 100.0000 mg | ORAL_TABLET | ORAL | Status: DC | PRN

## 2021-07-27 MED ORDER — PANTOPRAZOLE SODIUM 40 MG PO TBEC
40.0000 mg | DELAYED_RELEASE_TABLET | Freq: Every day | ORAL | Status: DC
  Administered 2021-07-28: 40 mg via ORAL
  Filled 2021-07-27: qty 1

## 2021-07-27 MED ORDER — INSULIN ASPART 100 UNIT/ML IJ SOLN
0.0000 [IU] | Freq: Three times a day (TID) | INTRAMUSCULAR | Status: DC
  Administered 2021-07-27: 4 [IU] via SUBCUTANEOUS
  Administered 2021-07-28: 11 [IU] via SUBCUTANEOUS
  Administered 2021-07-28: 7 [IU] via SUBCUTANEOUS

## 2021-07-27 MED ORDER — ROSUVASTATIN CALCIUM 5 MG PO TABS
10.0000 mg | ORAL_TABLET | Freq: Every day | ORAL | Status: DC
  Administered 2021-07-27 – 2021-07-28 (×2): 10 mg via ORAL
  Filled 2021-07-27 (×2): qty 2

## 2021-07-27 MED ORDER — LIDOCAINE HCL (CARDIAC) PF 100 MG/5ML IV SOSY
PREFILLED_SYRINGE | INTRAVENOUS | Status: DC | PRN
  Administered 2021-07-27: 100 mg via INTRAVENOUS

## 2021-07-27 MED ORDER — PROPOFOL 10 MG/ML IV BOLUS
INTRAVENOUS | Status: DC | PRN
Start: 2021-07-27 — End: 2021-07-27
  Administered 2021-07-27: 130 mg via INTRAVENOUS

## 2021-07-27 MED ORDER — MIDAZOLAM HCL 2 MG/2ML IJ SOLN: 0.5000 mg | Freq: Once | INTRAMUSCULAR | Status: DC | PRN

## 2021-07-27 MED ORDER — ALPRAZOLAM 0.5 MG PO TABS
0.5000 mg | ORAL_TABLET | Freq: Every day | ORAL | Status: DC
  Administered 2021-07-27 – 2021-07-28 (×2): 0.5 mg via ORAL
  Filled 2021-07-27 (×2): qty 1

## 2021-07-27 MED ORDER — LIDOCAINE-EPINEPHRINE 1 %-1:100000 IJ SOLN
INTRAMUSCULAR | Status: DC | PRN
Start: 2021-07-27 — End: 2021-07-27
  Administered 2021-07-27: 5 mL

## 2021-07-27 MED ORDER — PHENYLEPHRINE HCL-NACL 20-0.9 MG/250ML-% IV SOLN
INTRAVENOUS | Status: DC | PRN
  Administered 2021-07-27: 50 ug/min via INTRAVENOUS

## 2021-07-27 MED ORDER — HYDROMORPHONE HCL 1 MG/ML IJ SOLN
0.2500 mg | INTRAMUSCULAR | Status: DC | PRN
  Administered 2021-07-27 (×2): 0.5 mg via INTRAVENOUS

## 2021-07-27 MED ORDER — SEMAGLUTIDE(0.25 OR 0.5MG/DOS) 2 MG/1.5ML ~~LOC~~ SOPN: 1.0000 mg | PEN_INJECTOR | SUBCUTANEOUS | Status: DC

## 2021-07-27 MED ORDER — BISACODYL 10 MG RE SUPP: 10.0000 mg | Freq: Every day | RECTAL | Status: DC | PRN

## 2021-07-27 MED ORDER — ACETAMINOPHEN 500 MG PO TABS
1000.0000 mg | ORAL_TABLET | Freq: Once | ORAL | Status: AC
  Administered 2021-07-27: 1000 mg via ORAL
  Filled 2021-07-27: qty 2

## 2021-07-27 MED ORDER — BUDESONIDE 0.25 MG/2ML IN SUSP: 0.2500 mg | Freq: Two times a day (BID) | RESPIRATORY_TRACT | Status: DC

## 2021-07-27 MED ORDER — ROCURONIUM 10MG/ML (10ML) SYRINGE FOR MEDFUSION PUMP - OPTIME
INTRAVENOUS | Status: DC | PRN
Start: 2021-07-27 — End: 2021-07-27
  Administered 2021-07-27: 10 mg via INTRAVENOUS

## 2021-07-27 MED ORDER — OXYCODONE HCL 5 MG PO TABS
ORAL_TABLET | ORAL | Status: AC
  Filled 2021-07-27: qty 1

## 2021-07-27 MED ORDER — MENTHOL 3 MG MT LOZG: 1.0000 | LOZENGE | OROMUCOSAL | Status: DC | PRN

## 2021-07-27 MED ORDER — FENTANYL CITRATE (PF) 250 MCG/5ML IJ SOLN
INTRAMUSCULAR | Status: DC | PRN
  Administered 2021-07-27 (×4): 50 ug via INTRAVENOUS

## 2021-07-27 MED ORDER — ACETAMINOPHEN 325 MG PO TABS
650.0000 mg | ORAL_TABLET | ORAL | Status: DC | PRN
  Administered 2021-07-28 – 2021-07-29 (×3): 650 mg via ORAL
  Filled 2021-07-27 (×3): qty 2

## 2021-07-27 MED ORDER — ONDANSETRON HCL 4 MG/2ML IJ SOLN
INTRAMUSCULAR | Status: AC
  Filled 2021-07-27: qty 2

## 2021-07-27 MED ORDER — BUPIVACAINE HCL (PF) 0.5 % IJ SOLN
INTRAMUSCULAR | Status: DC | PRN
  Administered 2021-07-27: 5 mL

## 2021-07-27 MED ORDER — ACETAMINOPHEN 650 MG RE SUPP: 650.0000 mg | RECTAL | Status: DC | PRN

## 2021-07-27 MED ORDER — BREXPIPRAZOLE 0.25 MG PO TABS
0.5000 mg | ORAL_TABLET | Freq: Every day | ORAL | Status: DC
  Administered 2021-07-27 – 2021-07-28 (×2): 0.5 mg via ORAL
  Filled 2021-07-27 (×3): qty 2

## 2021-07-27 MED ORDER — ROCURONIUM BROMIDE 10 MG/ML (PF) SYRINGE
PREFILLED_SYRINGE | INTRAVENOUS | Status: AC
  Filled 2021-07-27: qty 10

## 2021-07-27 MED ORDER — METHOCARBAMOL 1000 MG/10ML IJ SOLN
500.0000 mg | Freq: Four times a day (QID) | INTRAVENOUS | Status: DC | PRN
  Filled 2021-07-27: qty 5

## 2021-07-27 SURGICAL SUPPLY — 70 items
BAG COUNTER SPONGE SURGICOUNT (BAG) ×2 IMPLANT
BASKET BONE COLLECTION (BASKET) ×2 IMPLANT
BLADE CLIPPER SURG (BLADE) IMPLANT
BONE CANC CHIPS 20CC PCAN1/4 (Bone Implant) ×2 IMPLANT
BUR MATCHSTICK NEURO 3.0 LAGG (BURR) ×2 IMPLANT
CAGE COROENT MP 8X9X23M-8 SPIN (Cage) ×2 IMPLANT
CANISTER SUCT 3000ML PPV (MISCELLANEOUS) ×2 IMPLANT
CHIPS CANC BONE 20CC PCAN1/4 (Bone Implant) ×1 IMPLANT
CNTNR URN SCR LID CUP LEK RST (MISCELLANEOUS) ×1 IMPLANT
CONT SPEC 4OZ STRL OR WHT (MISCELLANEOUS) ×2
COVER BACK TABLE 60X90IN (DRAPES) ×2 IMPLANT
DECANTER SPIKE VIAL GLASS SM (MISCELLANEOUS) ×1 IMPLANT
DERMABOND ADVANCED (GAUZE/BANDAGES/DRESSINGS) ×1
DERMABOND ADVANCED .7 DNX12 (GAUZE/BANDAGES/DRESSINGS) ×1 IMPLANT
DEVICE DISSECT PLASMABLAD 3.0S (MISCELLANEOUS) ×1 IMPLANT
DRAPE C-ARM 42X72 X-RAY (DRAPES) ×4 IMPLANT
DRAPE HALF SHEET 40X57 (DRAPES) ×1 IMPLANT
DRAPE LAPAROTOMY 100X72X124 (DRAPES) ×2 IMPLANT
DRSG OPSITE POSTOP 4X6 (GAUZE/BANDAGES/DRESSINGS) ×1 IMPLANT
DRSG OPSITE POSTOP 4X8 (GAUZE/BANDAGES/DRESSINGS) ×1 IMPLANT
DURAPREP 26ML APPLICATOR (WOUND CARE) ×2 IMPLANT
DURASEAL APPLICATOR TIP (TIP) IMPLANT
DURASEAL SPINE SEALANT 3ML (MISCELLANEOUS) IMPLANT
ELECT REM PT RETURN 9FT ADLT (ELECTROSURGICAL) ×2
ELECTRODE REM PT RTRN 9FT ADLT (ELECTROSURGICAL) ×1 IMPLANT
GAUZE 4X4 16PLY ~~LOC~~+RFID DBL (SPONGE) ×1 IMPLANT
GAUZE SPONGE 4X4 12PLY STRL (GAUZE/BANDAGES/DRESSINGS) ×2 IMPLANT
GLOVE SURG LTX SZ6.5 (GLOVE) ×2 IMPLANT
GLOVE SURG LTX SZ7 (GLOVE) ×2 IMPLANT
GLOVE SURG LTX SZ8.5 (GLOVE) ×6 IMPLANT
GLOVE SURG UNDER POLY LF SZ7 (GLOVE) ×2 IMPLANT
GLOVE SURG UNDER POLY LF SZ7.5 (GLOVE) ×10 IMPLANT
GLOVE SURG UNDER POLY LF SZ8.5 (GLOVE) ×6 IMPLANT
GOWN STRL REUS W/ TWL LRG LVL3 (GOWN DISPOSABLE) IMPLANT
GOWN STRL REUS W/ TWL XL LVL3 (GOWN DISPOSABLE) IMPLANT
GOWN STRL REUS W/TWL 2XL LVL3 (GOWN DISPOSABLE) ×4 IMPLANT
GOWN STRL REUS W/TWL LRG LVL3 (GOWN DISPOSABLE) ×2
GOWN STRL REUS W/TWL XL LVL3 (GOWN DISPOSABLE) ×10
GRAFT BNE CANC CHIPS 1-8 20CC (Bone Implant) IMPLANT
GRAFT BONE PROTEIOS LRG 5CC (Orthopedic Implant) ×1 IMPLANT
HEMOSTAT POWDER KIT SURGIFOAM (HEMOSTASIS) ×2 IMPLANT
KIT BASIN OR (CUSTOM PROCEDURE TRAY) ×2 IMPLANT
KIT GRAFTMAG DEL NEURO DISP (NEUROSURGERY SUPPLIES) ×2 IMPLANT
KIT TURNOVER KIT B (KITS) ×2 IMPLANT
MILL MEDIUM DISP (BLADE) ×2 IMPLANT
NDL HYPO 18GX1.5 BLUNT FILL (NEEDLE) IMPLANT
NEEDLE HYPO 18GX1.5 BLUNT FILL (NEEDLE) ×2 IMPLANT
NEEDLE HYPO 22GX1.5 SAFETY (NEEDLE) ×2 IMPLANT
NS IRRIG 1000ML POUR BTL (IV SOLUTION) ×2 IMPLANT
PACK LAMINECTOMY NEURO (CUSTOM PROCEDURE TRAY) ×2 IMPLANT
PAD ARMBOARD 7.5X6 YLW CONV (MISCELLANEOUS) ×9 IMPLANT
PATTIES SURGICAL .5 X1 (DISPOSABLE) ×2 IMPLANT
PLASMABLADE 3.0S (MISCELLANEOUS) ×2
ROD RELIN-O LORD 5.5X65MM (Rod) ×2 IMPLANT
SCREW LOCK RELINE 5.5 TULIP (Screw) ×6 IMPLANT
SCREW RELINE-O POLY 6.5X45 (Screw) ×6 IMPLANT
SPONGE SURGIFOAM ABS GEL 100 (HEMOSTASIS) ×3 IMPLANT
SPONGE T-LAP 4X18 ~~LOC~~+RFID (SPONGE) ×2 IMPLANT
SUT PROLENE 6 0 BV (SUTURE) IMPLANT
SUT VIC AB 1 CT1 18XBRD ANBCTR (SUTURE) ×1 IMPLANT
SUT VIC AB 1 CT1 8-18 (SUTURE) ×2
SUT VIC AB 2-0 CP2 18 (SUTURE) ×3 IMPLANT
SUT VIC AB 3-0 SH 8-18 (SUTURE) ×2 IMPLANT
SUT VIC AB 4-0 RB1 18 (SUTURE) ×2 IMPLANT
SYR 3ML LL SCALE MARK (SYRINGE) ×8 IMPLANT
SYR 5ML LL (SYRINGE) ×1 IMPLANT
TOWEL GREEN STERILE (TOWEL DISPOSABLE) ×2 IMPLANT
TOWEL GREEN STERILE FF (TOWEL DISPOSABLE) ×2 IMPLANT
TRAY FOLEY MTR SLVR 16FR STAT (SET/KITS/TRAYS/PACK) ×2 IMPLANT
WATER STERILE IRR 1000ML POUR (IV SOLUTION) ×2 IMPLANT

## 2021-07-27 NOTE — Op Note (Signed)
Date of surgery: 07/27/2021 Preoperative diagnosis: Spondylolisthesis L3-L4.  History of fusion L4-L5.  Lumbar stenosis.  Neurogenic claudication.  Lumbar radiculopathy. Postoperative diagnosis: Same Procedure: Bilateral laminotomies L3-L4 with more work than required for simple interbody technique.  Decompression of the L3 and L4 nerve roots with posterior lumbar interbody arthrodesis using peek spacers local autograft allograft.  Posterolateral arthrodesis with local autograft allograft and Proteus.  Pedicle screw fixation L3-L5 with revision of previous hardware at L4-L5. Surgeon: Kristeen Miss First Assistant: Consuella Lose MD Anesthesia: General endotracheal Indications: Katie Owens is a 71 year old individual who had surgery a number of years ago by Dr. Hal Neer at that time she had a unilateral decompression arthrodesis at the level of L4-L5.  She has been developing increasing pain in the back and then her right leg and now into her left leg and has developed a degenerative spondylolisthesis at L3-L4 above her fusion she has been advised regarding surgery to decompress and stabilize the L3-4 level.  Procedure: Patient was brought to the operating room supine on the stretcher.  After the smooth induction of general endotracheal anesthesia she was carefully turned prone.  The back was prepped with alcohol DuraPrep and draped in a sterile fashion.  Midline incision was created in the lower lumbar spine and carried down to the lumbodorsal fascia the L3-4 space was identified positively with a radiograph and then the dissection was carried out to expose the previously placed hardware at L4-L5.  The hardware was only right-sided.  The hardware was loosened and removed.  Then by creating laminotomies bilaterally at L3 removing the entire inferior laminar arch of L3 out to and including the entirety of the facet were able to expose the disc space at L3-4 carefully decompressed the L3 nerve root superiorly  and the L4 nerve roots inferiorly.  The disc space was noted to be bulging severely.  The disc space was entered and then a series of disc shavers curettes and rongeurs was used to evacuate the disc of significant quantities of degenerated disc material.  As the interspace was expanded a reduction of the spondylolisthesis was obtained.  Ultimately it was felt that an 8 x 9 x 23 mm spacer with 8 degrees lordosis would fit best into this interval this was fitted into the interspace after being packed with bone along with a total of 12 cc of autograft allograft and Proteus.  Then the lateral gutters were decorticated and the intertransverse space at L3 and L4 pedicle entry sites were chosen at L3 and 6.5 x 45 mm screws were placed into the pedicles at L3 using fluoroscopic guidance along with a 6.5 mm x 45 mm screw in the left side at L4.  It was decided because of the quality of the interbody fusion at the L4-5 level to replace the hardware at L4 and L5 and a right sided pedicle screw was placed at L5 using fluoroscopic guidance with a 6.5 x 45 mm screw being placed there once the screws were placed 65 mm precontoured rods were used to connect the pedicle screws together.  An additional 9 cc of bone graft was packed into the lateral gutters on the right side from L3-L5.  Once the posterolateral arthrodesis was completed and the screws were tightened in a neutral construct final radiographs were obtained in AP and lateral projection.  Hemostasis in the wound was obtained meticulously and then the lumbodorsal fascia was closed with #1 Vicryl in interrupted fashion 2-0 Vicryl in the subcutaneous tissues and 3-0  Vicryl subcuticularly.  4-0 Vicryl was also used in the final subcuticular closure.  Blood loss was estimated 400 cc 25 cc of's half percent Marcaine was injected into the paraspinous fascia at the time of closure.  Patient tolerated procedure well was returned to recovery room in stable condition.

## 2021-07-27 NOTE — Anesthesia Postprocedure Evaluation (Signed)
Anesthesia Post Note  Patient: Katie Owens  Procedure(s) Performed: Lumbar three-lumbar four  Posterior Lumbar Interbody Fusion (Spine Lumbar)     Patient location during evaluation: PACU Anesthesia Type: General Level of consciousness: awake and alert, patient cooperative and oriented Pain management: pain level controlled Vital Signs Assessment: post-procedure vital signs reviewed and stable Respiratory status: spontaneous breathing, nonlabored ventilation, respiratory function stable and patient connected to nasal cannula oxygen Cardiovascular status: blood pressure returned to baseline and stable Postop Assessment: no apparent nausea or vomiting Anesthetic complications: no   No notable events documented.  Last Vitals:  Vitals:   07/27/21 1311 07/27/21 1457  BP: (!) 133/55 130/85  Pulse:  (!) 104  Resp:  16  Temp:    SpO2:  97%    Last Pain:  Vitals:   07/27/21 1500  TempSrc:   PainSc: 3                  Leaira Fullam,E. Calandra Madura

## 2021-07-27 NOTE — H&P (Signed)
Katie Owens is an 71 y.o. female.   Chief Complaint: Back bilateral lower extremity pain right greater than left HPI: Katie Owens is a 71 year old individual who has had previous lumbar decompression and fusion surgery by Dr. Karie Chimera.  Her primary process back then was at L4-L5.  Patient had a mild spondylolisthesis noted at the time and in the past year and a half she has had progressively worsening back pain and initially right lower extremity pain she now notes in the last few months that the pain has shifted over to the left side also and she gets pain into both lower extremities with weakness and inability to walk distances.  Her films demonstrate that she has evolved a significant spondylolisthesis at L3-L4 with bilateral foraminal stenosis at that level.  Having failed efforts at conservative management I have advised surgical decompression and fusion at the level of L3-L4.  Patient is now being admitted to undergo that surgery.  Past Medical History:  Diagnosis Date   Abdominal wall mass 12/09/2020   lipoma, s/p excision 12/15/20 at time of cholecystectomy   Arthritis 12/09/2020   oa   Asthma    Cancer ([place]) 07/02/1993   breast cancer lt with 12 nodes out, chemo and radiation done   Chronic cholecystitis 12/09/2020   s/p cholecystectomy 12/15/20   chronic kidney disease 12/09/2020   stage 3 per pt   Contact lens/glasses fitting    wears contacts or glasses   COVID    jan  2022 cold and headache x 7 -8 days/all symptoms resolved per pt   Depression    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   Dysrhythmia    hx AF had ablation in 2012, has occ pvc's per pt   Fatty liver    Fibromyalgia    GERD (gastroesophageal reflux disease)    History of kidney stones    passed on own in past per pt   Hyperlipemia    Hypertension    IBS (irritable bowel syndrome)    migraines 12/09/2020   with weather changes per pt   PONV (postoperative nausea and vomiting)    Sciatica 12/09/2020   right butt  cheek and right leg and lower spine   Seasonal allergies    Shortness of breath 12/09/2020   on exertion   Sleep apnea    cpap broken waiting on new cpap, severe osa last sleep study home few months ago ( 12-09-2020 )   Uses walker 12/09/2020   left walker with friend out of the area   Wears glasses 12/09/2020    Past Surgical History:  Procedure Laterality Date   BREAST LUMPECTOMY WITH AXILLARY LYMPH NODE DISSECTION  07/02/1993   12 nodes removed   CARDIAC CATHETERIZATION  01/24/2009   left heart   CARDIAC ELECTROPHYSIOLOGY MAPPING AND ABLATION  07/02/2010   CARPAL TUNNEL RELEASE Right 10/14/2012   Procedure: CARPAL TUNNEL RELEASE;  Surgeon: Wynonia Sours, MD;  Location: [facility];  Service: Orthopedics;  Laterality: Right;   CHOLECYSTECTOMY N/A 12/15/2020   Procedure: LAPAROSCOPIC CHOLECYSTECTOMY WITH INTRAOPERATIVE CHOLANGIOGRAM, TAP BLOCK;  Surgeon: Michael Boston, MD;  Location: [facility]  Service: General;  Laterality: N/A;   COLONOSCOPY  2021   DIAGNOSTIC LAPAROSCOPY  07/02/1988   EP IMPLANTABLE DEVICE N/A 03/08/2016   Procedure: Loop Recorder Insertion;  Surgeon: Thompson Grayer, MD;  Location: [place] [facility];  Service: Cardiovascular;  Laterality: N/A;   LIVER BIOPSY N/A 12/15/2020   Procedure: NEEDLE CORE LIVER  BIOPSY;  Surgeon: Michael Boston, MD;  Location: [facility];  Service: General;  Laterality: N/A;   LUMBAR FUSION  2011   MASS EXCISION N/A 12/15/2020   Procedure: EXCISION MASS ABDOMINAL WALL;  Surgeon: Michael Boston, MD;  Location: [place];  Service: General;  Laterality: N/A;   TONSILLECTOMY     age 54 per pt    Family History  Problem Relation Age of Onset   Stroke Mother    Hypertension Mother    Alcoholism Mother    Alcoholism Father    Alcoholism Brother    Hypertension Brother    Hyperlipidemia Brother    Anemia Brother    Other Brother        bleeding hemorroids   Heart disease  Brother        valve surgery   Heart attack Maternal Grandmother        heart valve surgery   Hyperlipidemia Maternal Grandmother    Hypertension Maternal Grandmother    Social History:  reports that she quit smoking about 41 years ago. Her smoking use included cigarettes. She has a 30.00 pack-year smoking history. She has never used smokeless tobacco. She reports that she does not drink alcohol and does not use drugs.  Allergies:  Allergies  Allergen Reactions   Penicillins     Joints ache   Aspirin Hives and Rash   Ibuprofen Hives and Rash   Naproxen Hives and Rash    Medications Prior to Admission  Medication Sig Dispense Refill   ALPRAZolam (XANAX) 0.5 MG tablet Take 0.5 mg by mouth at bedtime.  1   b complex vitamins tablet Take 1 tablet by mouth daily.     Biotin 5 MG CAPS Take 5 mg by mouth daily.     Brexpiprazole (REXULTI) 0.5 MG TABS Take 0.5 mg by mouth daily.     Calcium Citrate (CITRACAL PO) Take 2 tablets by mouth daily.     carvedilol (COREG) 25 MG tablet Take 1 tablet (25 mg total) by mouth 2 (two) times daily. 180 tablet 3   celecoxib (CELEBREX) 200 MG capsule Take 200 mg by mouth daily.     chlorthalidone (HYGROTON) 25 MG tablet Take 25 mg by mouth daily.     Cholecalciferol (VITAMIN D) 50 MCG (2000 UT) tablet Take 2,000 Units by mouth daily.      cyclobenzaprine (FLEXERIL) 10 MG tablet Take 10 mg by mouth at bedtime as needed for muscle spasms.     dicyclomine (BENTYL) 20 MG tablet Take 20 mg by mouth every 8 (eight) hours as needed for spasms.     diltiazem (CARDIZEM) 30 MG tablet Take 30 mg by mouth every 4 (four) hours as needed (for HR>100 and BP>100).     diphenhydrAMINE (BENADRYL) 25 MG tablet Take 25 mg by mouth every 6 (six) hours as needed for allergies.     DULoxetine (CYMBALTA) 60 MG capsule Take 60 mg by mouth daily.     HYDROcodone-acetaminophen (NORCO) 5-325 MG per tablet Take 1 tablet by mouth every 6 (six) hours as needed for pain. (Patient taking  differently: Take 1 tablet by mouth in the morning and at bedtime.) 30 tablet 0   loperamide (IMODIUM) 2 MG capsule Take 2-4 mg by mouth as needed for diarrhea or loose stools.     magnesium oxide (MAG-OX) 400 MG tablet Take 1 tablet (400 mg total) by mouth daily. Please make yearly appt with Dr. [name] for March 2023 for future  refills. Thank you 1st attempt 90 tablet 1   Multiple Vitamins-Minerals (MULTIVITAMIN ADULT PO) Take 1 tablet by mouth daily.     omeprazole (PRILOSEC) 20 MG capsule Take 20 mg by mouth daily.     OZEMPIC, 0.25 OR 0.5 MG/DOSE, 2 MG/1.5ML SOPN Inject 1 mg as directed every Monday.     potassium chloride SA (K-DUR,KLOR-CON) 20 MEQ tablet Take 20 mEq by mouth daily.      pregabalin (LYRICA) 75 MG capsule Take 75 mg by mouth 2 (two) times daily.     rosuvastatin (CRESTOR) 10 MG tablet Take 10 mg by mouth daily after supper.     SUMAtriptan (IMITREX) 100 MG tablet Take 100 mg by mouth as needed for migraine.     telmisartan (MICARDIS) 40 MG tablet Take 20 mg by mouth daily.     VENTOLIN HFA 108 (90 BASE) MCG/ACT inhaler Inhale 1-2 puffs into the lungs every 6 (six) hours as needed for wheezing or shortness of breath.   3   beclomethasone (QVAR) 80 MCG/ACT inhaler Inhale 1 puff into the lungs 2 (two) times daily as needed (shortness of breath).      Results for orders placed or performed during the hospital encounter of 07/27/21 (from the past 48 hour(s))  Glucose, capillary     Status: Abnormal   Collection Time: 07/27/21  6:13 AM  Result Value Ref Range   Glucose-Capillary 185 (H) 70 - 99 mg/dL    Comment: Glucose reference range applies only to samples taken after fasting for at least 8 hours.   No results found.  Review of Systems  Musculoskeletal:  Positive for back pain and gait problem.  Neurological:  Positive for weakness and numbness.  All other systems reviewed and are negative.  Blood pressure (!) 138/47, pulse 95, temperature 98.2 F (36.8 C), temperature  source Oral, resp. rate 18, SpO2 93 %. Physical Exam Constitutional:      Appearance: Normal appearance. She is obese.  HENT:     Head: Normocephalic and atraumatic.     Nose: Nose normal.     Mouth/Throat:     Mouth: Mucous membranes are moist.  Eyes:     Extraocular Movements: Extraocular movements intact.     Pupils: Pupils are equal, round, and reactive to light.  Cardiovascular:     Rate and Rhythm: Normal rate and regular rhythm.     Pulses: Normal pulses.     Heart sounds: Normal heart sounds.  Pulmonary:     Effort: Pulmonary effort is normal.     Breath sounds: Normal breath sounds.  Abdominal:     General: Bowel sounds are normal.     Palpations: Abdomen is soft.  Musculoskeletal:     Cervical back: Normal range of motion and neck supple.     Comments: Positive straight leg raising bilaterally Patrick's maneuver is negative bilaterally.  Skin:    General: Skin is warm and dry.  Neurological:     Mental Status: She is alert.     Comments: Moderate weakness in the quadriceps bilaterally absent patellar reflexes iliopsoas strength is 4 out of 5 gastrocs and tibialis anterior are intact with absent Achilles reflexes bilaterally incision is diminished dorsum of both feet and anterior shins.  Cranial nerve examination is within the limits of normal upper extremity strength and reflexes are normal  Psychiatric:        Mood and Affect: Mood normal.        Behavior: Behavior normal.  Thought Content: Thought content normal.        Judgment: Judgment normal.     Assessment/Plan Spondylolisthesis L3-L4 history of fusion L4-L5.  Neurogenic claudication.  Lumbar radiculopathy.  Plan: Decompression fusion L3-L4 with incorporation of hardware from L3-L5.  Earleen Newport, MD 07/27/2021, 7:40 AM

## 2021-07-27 NOTE — Anesthesia Procedure Notes (Signed)
Procedure Name: Intubation Date/Time: 07/27/2021 8:00 AM Performed by: Claris Che, CRNA Pre-anesthesia Checklist: Patient identified, Emergency Drugs available, Suction available, Patient being monitored and Timeout performed Patient Re-evaluated:Patient Re-evaluated prior to induction Oxygen Delivery Method: Circle system utilized Preoxygenation: Pre-oxygenation with 100% oxygen Induction Type: IV induction Ventilation: Mask ventilation without difficulty Laryngoscope Size: Mac and 4 Tube type: Oral Tube size: 7.5 mm Number of attempts: 1 Airway Equipment and Method: Stylet Placement Confirmation: ETT inserted through vocal cords under direct vision, positive ETCO2 and breath sounds checked- equal and bilateral Secured at: 22 cm Tube secured with: Tape Dental Injury: Teeth and Oropharynx as per pre-operative assessment

## 2021-07-27 NOTE — Progress Notes (Signed)
Pt doesn't have any drain in place per Rn post op.  Vanc 750mg  IV x1  Onnie Boer, PharmD, [place], AAHIVP, CPP Infectious Disease Pharmacist 07/27/2021 3:13 PM

## 2021-07-27 NOTE — Transfer of Care (Signed)
Immediate Anesthesia Transfer of Care Note  Patient: Katie Owens  Procedure(s) Performed: Lumbar three-lumbar four  Posterior Lumbar Interbody Fusion (Spine Lumbar)  Patient Location: PACU  Anesthesia Type:General  Level of Consciousness: drowsy, patient cooperative and responds to stimulation  Airway & Oxygen Therapy: Patient Spontanous Breathing and Patient connected to nasal cannula oxygen  Post-op Assessment: Report given to RN, Post -op Vital signs reviewed and stable and Patient moving all extremities X 4  Post vital signs: Reviewed and stable  Last Vitals:  Vitals Value Taken Time  BP 131/63 07/27/21 1241  Temp    Pulse 99 07/27/21 1242  Resp 6 07/27/21 1242  SpO2 90 % 07/27/21 1242  Vitals shown include unvalidated device data.  Last Pain:  Vitals:   07/27/21 0646  TempSrc:   PainSc: 3          Complications: No notable events documented.

## 2021-07-28 LAB — CBC
HCT: 33.9 % — ABNORMAL LOW (ref 36.0–46.0)
Hemoglobin: 11.3 g/dL — ABNORMAL LOW (ref 12.0–15.0)
MCH: 29 pg (ref 26.0–34.0)
MCHC: 33.3 g/dL (ref 30.0–36.0)
MCV: 87.1 fL (ref 80.0–100.0)
Platelets: 176 10*3/uL (ref 150–400)
RBC: 3.89 MIL/uL (ref 3.87–5.11)
RDW: 13.3 % (ref 11.5–15.5)
WBC: 11.2 10*3/uL — ABNORMAL HIGH (ref 4.0–10.5)
nRBC: 0 % (ref 0.0–0.2)

## 2021-07-28 LAB — GLUCOSE, CAPILLARY
Glucose-Capillary: 236 mg/dL — ABNORMAL HIGH (ref 70–99)
Glucose-Capillary: 200 mg/dL — ABNORMAL HIGH (ref 70–99)
Glucose-Capillary: 261 mg/dL — ABNORMAL HIGH (ref 70–99)
Glucose-Capillary: 273 mg/dL — ABNORMAL HIGH (ref 70–99)

## 2021-07-28 LAB — BASIC METABOLIC PANEL
Anion gap: 10 (ref 5–15)
BUN: 16 mg/dL (ref 8–23)
CO2: 23 mmol/L (ref 22–32)
Calcium: 8.6 mg/dL — ABNORMAL LOW (ref 8.9–10.3)
Chloride: 99 mmol/L (ref 98–111)
Creatinine, Ser: 1.29 mg/dL — ABNORMAL HIGH (ref 0.44–1.00)
GFR, Estimated: 44 mL/min — ABNORMAL LOW (ref >60)
Glucose, Bld: 250 mg/dL — ABNORMAL HIGH (ref 70–99)
Potassium: 4.1 mmol/L (ref 3.5–5.1)
Sodium: 132 mmol/L — ABNORMAL LOW (ref 135–145)

## 2021-07-28 MED ORDER — THROMBIN 5000 UNITS EX SOLR: OROMUCOSAL | Status: DC | PRN

## 2021-07-28 MED ORDER — INSULIN ASPART 100 UNIT/ML IJ SOLN
0.0000 [IU] | Freq: Every day | INTRAMUSCULAR | Status: DC
  Administered 2021-07-28: 3 [IU] via SUBCUTANEOUS

## 2021-07-28 MED ORDER — HYDROCORTISONE 0.5 % EX CREA
1.0000 "application " | TOPICAL_CREAM | Freq: Four times a day (QID) | CUTANEOUS | Status: DC
  Administered 2021-07-28 – 2021-07-29 (×3): 1 via TOPICAL
  Filled 2021-07-28 (×2): qty 28.35

## 2021-07-28 MED ORDER — INSULIN ASPART 100 UNIT/ML IJ SOLN
0.0000 [IU] | Freq: Three times a day (TID) | INTRAMUSCULAR | Status: DC
  Administered 2021-07-29: 11 [IU] via SUBCUTANEOUS

## 2021-07-28 MED ORDER — TRAMADOL HCL 50 MG PO TABS
50.0000 mg | ORAL_TABLET | Freq: Four times a day (QID) | ORAL | Status: DC | PRN
  Administered 2021-07-28 – 2021-07-29 (×2): 50 mg via ORAL
  Administered 2021-07-29: 100 mg via ORAL
  Filled 2021-07-28: qty 2
  Filled 2021-07-28 (×2): qty 1

## 2021-07-28 NOTE — Evaluation (Signed)
Physical Therapy Evaluation Patient Details Name: Katie Owens MRN: [id number] DOB: 12/13/1950 Today's Date: 07/28/2021  History of Present Illness  Pt is a 71 y/o female who presents s/p L3-L4 PLIF on 07/27/2021. PMH significant for L breast CA, CKD III, COVID Jan 2022, DM II, fibromyalgia, HTN, loop recorder 2017, Lumbar fusion 2011, R carpal tunnel release 2014.   Clinical Impression  Pt admitted with above diagnosis. At the time of PT eval, pt was able to demonstrate transfers and ambulation with gross supervision for safety and rollator for support. Pt fatigues quickly and requires frequent seated rest breaks during gait training, however overall steady and did not require hands on assistance. Pt was able to negotiate a flight of stairs with min-mod assist through L HHA, however feel she could be more independent with cane use. Pt was educated on precautions, brace application/wearing schedule, appropriate activity progression, and car transfer. Pt currently with functional limitations due to the deficits listed below (see PT Problem List).     After session, pt, RN, PT all had an extensive discussion regarding options for follow up therapies. Pt most interested in AIR, however it is likely that pt does not have a qualifying diagnosis, and she is currently mobilizing without physical assistance. Pt not interested in SNF level therapies, and states she has a friend group who can assist her as needed when she gets home. Encouraged pt to call her friend group and set up frequent assistance after d/c.  Acutely, pt will benefit from skilled PT to increase their independence and safety with mobility to allow discharge to the venue listed below.       Recommendations for follow up therapy are one component of a multi-disciplinary discharge planning process, led by the attending physician.  Recommendations may be updated based on patient status, additional functional criteria and insurance  authorization.  Follow Up Recommendations Home health PT    Assistance Recommended at Discharge Intermittent Supervision/Assistance  Patient can return home with the following  A little help with walking and/or transfers;Assist for transportation;Help with stairs or ramp for entrance    Equipment Recommendations None recommended by PT  Recommendations for Other Services       Functional Status Assessment Patient has had a recent decline in their functional status and demonstrates the ability to make significant improvements in function in a reasonable and predictable amount of time.     Precautions / Restrictions Precautions Precautions: Fall;Back Precaution Booklet Issued: Yes (comment) Precaution Comments: Reviewed handout with pt and she was cued for precautions during functional mobility. Required Braces or Orthoses: Spinal Brace Spinal Brace: Lumbar corset;Applied in sitting position Restrictions Weight Bearing Restrictions: No      Mobility  Bed Mobility               General bed mobility comments: Pt was received sitting up EOB.    Transfers Overall transfer level: Needs assistance Equipment used: Rolling walker (2 wheels), Rollator (4 wheels) Transfers: Sit to/from Stand Sit to Stand: Supervision           General transfer comment: VC's for optimal safety with power up to full stand. Pt with difficulty initiating but able to power up fairly well once hips cleared from the bed or rollator. VC's for hand placement on seated surface.    Ambulation/Gait Ambulation/Gait assistance: Supervision Gait Distance (Feet): 100 Feet Assistive device: Rolling walker (2 wheels), Rollator (4 wheels) Gait Pattern/deviations: Step-through pattern, Decreased stride length, Wide base of support Gait velocity: Decreased  Gait velocity interpretation: 1.31 - 2.62 ft/sec, indicative of limited community ambulator   General Gait Details: VC's throughout for improved  posture, closer walker proximity, and forward gaze. No assist required. Pt utilized both RW and rollator, as she uses rollator at home. Pt was able to manage rollator well and transition to/from sitting on rollator when she needed a seated rest break.  Stairs Stairs: Yes Stairs assistance: Min assist, Mod assist Stair Management: One rail Right, Step to pattern, Forwards Number of Stairs: 10 General stair comments: Pt negotiated a full flight of stairs with HHA on the L and railing use on the R. Pt with heavy push through therapist's hand to decrease pull on railing and maintain good posture.  Wheelchair Mobility    Modified Rankin (Stroke Patients Only)       Balance Overall balance assessment: Needs assistance Sitting-balance support: Feet supported, No upper extremity supported Sitting balance-Leahy Scale: Fair     Standing balance support: No upper extremity supported, During functional activity Standing balance-Leahy Scale: Fair Standing balance comment: Not heavily reliant on walker for support                             Pertinent Vitals/Pain Pain Assessment Pain Assessment: 0-10 Pain Score: 5  Pain Location: Back Pain Descriptors / Indicators: Operative site guarding, Sore Pain Intervention(s): Limited activity within patient's tolerance, Monitored during session, Repositioned    Home Living Family/patient expects to be discharged to:: Private residence Living Arrangements: Alone Available Help at Discharge: Friend(s);Family;Available PRN/intermittently Type of Home: Apartment Home Access: Stairs to enter Entrance Stairs-Rails: Right;Left Entrance Stairs-Number of Steps: Flight from outside to inside of the [employer], and then one additional flight up to her apartment once inside.   Home Layout: One level Home Equipment: Rollator (4 wheels);Cane - quad;Shower seat      Prior Function Prior Level of Function : Needs assist       Physical  Assist : Mobility (physical);ADLs (physical) Mobility (physical): Gait ADLs (physical): IADLs Mobility Comments: Uses rollator inside and QC when going out into the community. ADLs Comments: Pt reports she microwave cooks, and has a friend who brings in meals. She does not go to the grocery store - she has groceries delivered.     Hand Dominance        Extremity/Trunk Assessment   Upper Extremity Assessment Upper Extremity Assessment: LUE deficits/detail LUE Deficits / Details: Pt reports rotator cuff injury on the L. Grossly functional throughout session.    Lower Extremity Assessment Lower Extremity Assessment: Generalized weakness;RLE deficits/detail RLE Deficits / Details: Bilaterally, LE's are generally weak however pt with increased weakness on the R consistent with pre-op diagnosis.    Cervical / Trunk Assessment Cervical / Trunk Assessment: Back Surgery  Communication   Communication: No difficulties  Cognition Arousal/Alertness: Awake/alert Behavior During Therapy: WFL for tasks assessed/performed Overall Cognitive Status: Within Functional Limits for tasks assessed                                          General Comments      Exercises     Assessment/Plan    PT Assessment Patient needs continued PT services  PT Problem List Decreased strength;Decreased activity tolerance;Decreased balance;Decreased mobility;Decreased knowledge of use of DME;Decreased knowledge of precautions;Decreased safety awareness;Pain       PT Treatment  Interventions DME instruction;Gait training;Stair training;Functional mobility training;Therapeutic activities;Therapeutic exercise;Neuromuscular re-education;Patient/family education    PT Goals (Current goals can be found in the Care Plan section)  Acute Rehab PT Goals Patient Stated Goal: Be able to manage at home with intermittent support PT Goal Formulation: With patient Time For Goal Achievement:  08/04/21 Potential to Achieve Goals: Good    Frequency Min 5X/week     Co-evaluation               AM-PAC PT "6 Clicks" Mobility  Outcome Measure Help needed turning from your back to your side while in a flat bed without using bedrails?: A Little Help needed moving from lying on your back to sitting on the side of a flat bed without using bedrails?: A Little Help needed moving to and from a bed to a chair (including a wheelchair)?: A Little Help needed standing up from a chair using your arms (e.g., wheelchair or bedside chair)?: A Little Help needed to walk in hospital room?: A Little Help needed climbing 3-5 steps with a railing? : A Little 6 Click Score: 18    End of Session Equipment Utilized During Treatment: Gait belt;Back brace Activity Tolerance: Patient tolerated treatment well Patient left: in bed;with call bell/phone within reach;with nursing/sitter in room Nurse Communication: Mobility status PT Visit Diagnosis: Unsteadiness on feet (R26.81);Pain Pain - part of body:  (back)    Time: 1027-2536 PT Time Calculation (min) (ACUTE ONLY): 41 min   Charges:   PT Evaluation $PT Eval Low Complexity: 1 Low PT Treatments $Gait Training: 23-37 mins        Rolinda Roan, PT, DPT Acute Rehabilitation Services Pager: [phone] Office: [phone]   Thelma Comp 07/28/2021, 11:27 AM

## 2021-07-28 NOTE — Progress Notes (Addendum)
Patient ID: Katie Owens, female   DOB: 1950/10/21, 71 y.o.   MRN: [id number] Vital signs stable,alert  Moving slowly.  Patient lives alone, Will need SNF for rehab . Post op labs ok, glucose elevated with coverage being given

## 2021-07-28 NOTE — Progress Notes (Signed)
Patient called staff to informed the writer she noticed hives on her arms,abdominal area and back. Patient also informed staff she takes cortisone cream/ benadryl when she gets hives at home. MD informed and new orders received. Will continue to monitor patient status.

## 2021-07-28 NOTE — Evaluation (Signed)
Occupational Therapy Evaluation Patient Details Name: Katie Owens MRN: [id number] DOB: 1951/02/20 Today's Date: 07/28/2021   History of Present Illness Pt is a 71 y/o female who presents s/p L3-L4 PLIF on 07/27/2021. PMH significant for L breast CA, CKD III, COVID Jan 2022, DM II, fibromyalgia, HTN, loop recorder 2017, Lumbar fusion 2011, R carpal tunnel release 2014.   Clinical Impression   Pt admitted for procedure listed above. PTA pt reported that she was fairly independent with all ADL's and IADL's. At this time, pt is limited by pain and weakness, requiring min guard for all ADL's and functional mobility at this time. Requires continued education on safe transfers and mobility, using her rollator. Recommending HHOT to maximize pt's independence and safety at home. OT will follow acutely.      Recommendations for follow up therapy are one component of a multi-disciplinary discharge planning process, led by the attending physician.  Recommendations may be updated based on patient status, additional functional criteria and insurance authorization.   Follow Up Recommendations  Home health OT    Assistance Recommended at Discharge Set up Supervision/Assistance  Patient can return home with the following A little help with walking and/or transfers;A little help with bathing/dressing/bathroom;Assistance with cooking/housework;Other (comment) (Help with litter box)    Functional Status Assessment  Patient has had a recent decline in their functional status and demonstrates the ability to make significant improvements in function in a reasonable and predictable amount of time.  Equipment Recommendations  None recommended by OT    Recommendations for Other Services       Precautions / Restrictions Precautions Precautions: Fall;Back Precaution Booklet Issued: Yes (comment) Precaution Comments: Reviewed handout with pt and she was cued for precautions during functional  mobility. Required Braces or Orthoses: Spinal Brace Spinal Brace: Lumbar corset;Applied in sitting position Restrictions Weight Bearing Restrictions: No      Mobility Bed Mobility Overal bed mobility: Modified Independent             General bed mobility comments: Pt completed log roll with no difficulties or assist.    Transfers Overall transfer level: Needs assistance Equipment used: Rolling walker (2 wheels), Rollator (4 wheels) Transfers: Sit to/from Stand Sit to Stand: Supervision           General transfer comment: VC for safety with powering up, completed multiple sit<>stands from bed and toilet this session      Balance Overall balance assessment: Needs assistance Sitting-balance support: Feet supported, No upper extremity supported Sitting balance-Leahy Scale: Fair     Standing balance support: No upper extremity supported, During functional activity Standing balance-Leahy Scale: Fair Standing balance comment: Not heavily reliant on walker for support                           ADL either performed or assessed with clinical judgement   ADL Overall ADL's : Needs assistance/impaired                                       General ADL Comments: Overall requiring min gaurd assist for all ADL's, following compensatory strategies.     Vision Baseline Vision/History: 0 No visual deficits Ability to See in Adequate Light: 0 Adequate Patient Visual Report: No change from baseline Vision Assessment?: No apparent visual deficits     Perception     Praxis  Pertinent Vitals/Pain Pain Assessment Pain Assessment: 0-10 Pain Score: 5  Pain Location: Back Pain Descriptors / Indicators: Operative site guarding, Sore Pain Intervention(s): Limited activity within patient's tolerance, Monitored during session, Repositioned     Hand Dominance Right   Extremity/Trunk Assessment Upper Extremity Assessment Upper Extremity  Assessment: LUE deficits/detail LUE Deficits / Details: Pt reports rotator cuff injury on the L. Grossly functional throughout session.   Lower Extremity Assessment Lower Extremity Assessment: Defer to PT evaluation RLE Deficits / Details: Bilaterally, LE's are generally weak however pt with increased weakness on the R consistent with pre-op diagnosis.   Cervical / Trunk Assessment Cervical / Trunk Assessment: Back Surgery   Communication Communication Communication: No difficulties   Cognition Arousal/Alertness: Awake/alert Behavior During Therapy: WFL for tasks assessed/performed Overall Cognitive Status: Within Functional Limits for tasks assessed                                       General Comments  VSS on RA, dressings appear intact    Exercises     Shoulder Instructions      Home Living Family/patient expects to be discharged to:: Private residence Living Arrangements: Alone Available Help at Discharge: Friend(s);Family;Available PRN/intermittently Type of Home: Apartment Home Access: Stairs to enter Entrance Stairs-Number of Steps: Flight from outside to inside of the condo building, and then one additional flight up to her apartment once inside. Entrance Stairs-Rails: Right;Left Home Layout: One level     Bathroom Shower/Tub: Occupational psychologist: Standard     Home Equipment: Rollator (4 wheels);Cane - quad;Shower seat          Prior Functioning/Environment Prior Level of Function : Needs assist       Physical Assist : Mobility (physical);ADLs (physical) Mobility (physical): Gait ADLs (physical): IADLs Mobility Comments: Uses rollator inside and QC when going out into the community. ADLs Comments: Pt reports she microwave cooks, and has a friend who brings in meals. She does not go to the grocery store - she has groceries delivered.        OT Problem List: Decreased strength;Decreased activity tolerance;Impaired  balance (sitting and/or standing);Pain      OT Treatment/Interventions: Self-care/ADL training;Therapeutic exercise;Energy conservation;DME and/or AE instruction;Therapeutic activities;Patient/family education;Balance training    OT Goals(Current goals can be found in the care plan section) Acute Rehab OT Goals Patient Stated Goal: To reduce pain and be more independent OT Goal Formulation: With patient Time For Goal Achievement: 08/11/21 Potential to Achieve Goals: Good ADL Goals Pt Will Perform Lower Body Bathing: with modified independence;sitting/lateral leans;sit to/from stand Pt Will Perform Lower Body Dressing: with modified independence;sitting/lateral leans;sit to/from stand Pt Will Transfer to Toilet: with modified independence;ambulating Pt Will Perform Toileting - Clothing Manipulation and hygiene: with modified independence;sitting/lateral leans;sit to/from stand Additional ADL Goal #1: Pt will follow 3/3 spinal precautions 100% of the time. Additional ADL Goal #2: Pt will tolerate standing ADL task for 5 mins to improve activity tolerance.  OT Frequency: Min 2X/week    Co-evaluation              AM-PAC OT "6 Clicks" Daily Activity     Outcome Measure Help from another person eating meals?: None Help from another person taking care of personal grooming?: None Help from another person toileting, which includes using toliet, bedpan, or urinal?: A Little Help from another person bathing (including washing, rinsing, drying)?: A Little  Help from another person to put on and taking off regular upper body clothing?: None Help from another person to put on and taking off regular lower body clothing?: A Little 6 Click Score: 21   End of Session Equipment Utilized During Treatment: Rolling walker (2 wheels);Back brace Nurse Communication: Mobility status  Activity Tolerance: Patient tolerated treatment well Patient left: in bed;with call bell/phone within reach  OT Visit  Diagnosis: Unsteadiness on feet (R26.81);Other abnormalities of gait and mobility (R26.89);Muscle weakness (generalized) (M62.81)                Time: 1720-9106 OT Time Calculation (min): 48 min Charges:  OT General Charges $OT Visit: 1 Visit OT Evaluation $OT Eval Moderate Complexity: 1 Mod OT Treatments $Self Care/Home Management : 23-37 mins  Vallerie Hentz H., OTR/L Acute Rehabilitation  Oluwaseyi Tull Elane Shayana Hornstein 07/28/2021, 11:54 AM

## 2021-07-28 NOTE — TOC Initial Note (Signed)
Transition of Care [facility]) - Initial/Assessment Note    Patient Details  Name: Katie Owens MRN: [id number] Date of Birth: Nov 20, 1950  [facility] CM/SW Contact:    Ninfa Meeker, RN Phone Number: 07/28/2021, 2:02 PM  Clinical Narrative:    Patient  is a 71 y/o female  s/p L3-L4 PLIF on 07/27/2021 . Case manager spoke with patient concerning need for Home Health therapies. She states she has no preference, CM provided [facility] of [place], permission given to call referral to [facility] liaison. Patient states she lives alone and will have neighbors that will help, they will also be the persons that will open the lower level door for the therapist.   Expected Discharge Plan: Home w [employer] Barriers to Discharge: No Barriers Identified   Patient Goals and CMS Choice     Choice offered to / list presented to : Patient  Expected Discharge Plan and Services Expected Discharge Plan: [place] In-house Referral: NA Discharge Planning Services: CM Consult Post Acute Care Choice: Home Health Living arrangements for the past 2 months: Apartment                 DME Arranged: N/A DME Agency: NA       HH Arranged: PT, OT, Nurse's Aide HH Agency: [place] Date [facility] Agency Contacted: 07/28/21 Time HH Agency Contacted: 70 Representative spoke with at [place]: Jenny Reichmann  Prior Living Arrangements/Services Living arrangements for the past 2 months: Apartment Lives with:: Self Patient language and need for interpreter reviewed:: Yes Do you feel safe going back to the place where you live?: Yes      Need for Family Participation in Patient Care: Yes (Comment) Care giver support system in place?: Yes (comment)   Criminal Activity/Legal Involvement Pertinent to Current Situation/Hospitalization: No - Comment as needed  Activities of Daily Living      Permission Sought/Granted                  Emotional  Assessment   Attitude/Demeanor/Rapport: Gracious   Orientation: : Oriented to Self, Oriented to Place, Oriented to  Time, Oriented to Situation Alcohol / Substance Use: Not Applicable Psych Involvement: No (comment)  Admission diagnosis:  Spondylolisthesis of lumbar region [M43.16] Patient Active Problem List   Diagnosis Date Noted   Spondylolisthesis of lumbar region 07/27/2021   Chronic calculous cholecystitis s/p lap cholecystectomy 12/15/2020 12/15/2020   NAFLD (nonalcoholic fatty liver disease) 12/15/2020   Hypoxia 12/15/2020   Circadian rhythm sleep disorder, delayed sleep phase type 06/15/2020   Hyperlipidemia LDL goal <70 08/25/2014   Obstructive sleep apnea 08/25/2014   Morbid obesity ([place]) 08/25/2014   HYPERLIPIDEMIA 09/20/2009   Essential hypertension 09/20/2009   ATRIAL FIBRILLATION, PAROXYSMAL 09/20/2009   GERD 09/20/2009   PCP:  Derinda Late, MD Pharmacy:   [id number] [address], [place] [address] Phone: [phone] Fax: [phone]  [id number] [place], [place] - [address] AT [place] [address] Phone: [phone] Fax: [phone]     Social Determinants of Health (SDOH) Interventions    Readmission Risk Interventions No flowsheet data found.

## 2021-07-29 LAB — GLUCOSE, CAPILLARY: Glucose-Capillary: 288 mg/dL — ABNORMAL HIGH (ref 70–99)

## 2021-07-29 MED ORDER — FLUCONAZOLE 150 MG PO TABS
150.0000 mg | ORAL_TABLET | Freq: Once | ORAL | Status: AC
  Administered 2021-07-29: 150 mg via ORAL
  Filled 2021-07-29: qty 1

## 2021-07-29 MED ORDER — TRAMADOL HCL 50 MG PO TABS: 50.0000 mg | ORAL_TABLET | Freq: Four times a day (QID) | ORAL | 1 refills | Status: DC | PRN

## 2021-07-29 MED ORDER — DOCUSATE SODIUM 100 MG PO CAPS: 100.0000 mg | ORAL_CAPSULE | Freq: Two times a day (BID) | ORAL | 0 refills | Status: DC

## 2021-07-29 MED ORDER — ONDANSETRON HCL 4 MG PO TABS: 4.0000 mg | ORAL_TABLET | Freq: Three times a day (TID) | ORAL | 1 refills | Status: AC | PRN

## 2021-07-29 NOTE — TOC Transition Note (Signed)
Transition of Care [facility]) - CM/SW Discharge Note   Patient Details  Name: Katie Owens MRN: [id number] Date of Birth: 1950-12-13  Transition of Care [facility]) CM/SW Contact:  Carles Collet, RN Phone Number: 07/29/2021, 9:05 AM   Clinical Narrative:    [name] of DC today. No new needs.      Final next level of care: Home w Home Health Services Barriers to Discharge: No Barriers Identified   Patient Goals and CMS Choice     Choice offered to / list presented to : Patient  Discharge Placement                       Discharge Plan and Services In-house Referral: NA Discharge Planning Services: CM Consult Post Acute Care Choice: Home Health          DME Arranged: N/A DME Agency: NA       HH Arranged: PT, OT, Nurse's Aide [place] Agency: [place] Date [facility] Agency Contacted: 07/29/21 Time [place]: [phone] Representative spoke with at [place]: [place] Determinants of Health ([place]) Interventions     Readmission Risk Interventions No flowsheet data found.

## 2021-07-29 NOTE — Progress Notes (Signed)
Occupational Therapy Treatment Patient Details Name: Katie Owens MRN: [id number] DOB: 05-Jan-1951 Today's Date: 07/29/2021   History of present illness Pt is a 71 y/o female who presents s/p L3-L4 PLIF on 07/27/2021. PMH significant for L breast CA, CKD III, COVID Jan 2022, DM II, fibromyalgia, HTN, loop recorder 2017, Lumbar fusion 2011, R carpal tunnel release 2014.   OT comments  Katie Owens is progressing well, she verbalized great understanding of back precautions and brace wear/care schedule. Overall she is completing functional mobility and ADLs with min G - supervision with minimal verbal cues for compensatory techniques. Reviewed shower and car transfers with great understanding. Pt continues to be limited by generalized weakness, pain and precautions, but plans to have near 24/7 assist   and home health therapies at d/c.    Recommendations for follow up therapy are one component of a multi-disciplinary discharge planning process, led by the attending physician.  Recommendations may be updated based on patient status, additional functional criteria and insurance authorization.    Follow Up Recommendations  Home health OT    Assistance Recommended at Discharge Set up Supervision/Assistance  Patient can return home with the following  A little help with walking and/or transfers;A little help with bathing/dressing/bathroom;Assistance with cooking/housework;Other (comment)   Equipment Recommendations  None recommended by OT       Precautions / Restrictions Precautions Precautions: Fall;Back Precaution Booklet Issued: Yes (comment) Precaution Comments: Reviewed handout with pt and she was cued for precautions during functional mobility. Required Braces or Orthoses: Spinal Brace Spinal Brace: Lumbar corset;Applied in sitting position Restrictions Weight Bearing Restrictions: No       Mobility Bed Mobility Overal bed mobility: Modified Independent             General bed  mobility comments: Pt completed log roll with no difficulties or assist.    Transfers Overall transfer level: Needs assistance Equipment used: Rolling walker (2 wheels), Rollator (4 wheels) Transfers: Sit to/from Stand Sit to Stand: Supervision           General transfer comment: slow to rise, also reviewed shower transfer with shower seat and car transfer with RW     Balance Overall balance assessment: Needs assistance Sitting-balance support: Feet supported, No upper extremity supported Sitting balance-Leahy Scale: Good     Standing balance support: No upper extremity supported, During functional activity Standing balance-Leahy Scale: Fair                             ADL either performed or assessed with clinical judgement   ADL Overall ADL's : Needs assistance/impaired       General ADL Comments: Overall requiring min gaurd assist for all ADL's, following compensatory strategies. - continues to benefit from verbal cues. Pt able to utilize a modified figure four position with her leg propped on the bed for lower body tasks. Educated on use of assistance for LB tasks and shower transfers to maintain back precautions and safety    Extremity/Trunk Assessment Upper Extremity Assessment Upper Extremity Assessment: LUE deficits/detail LUE Deficits / Details: Pt reports rotator cuff injury on the L. Grossly functional throughout session.   Lower Extremity Assessment Lower Extremity Assessment: Defer to PT evaluation        Vision   Vision Assessment?: No apparent visual deficits Additional Comments: wears glasses   Perception Perception Perception: Not tested   Praxis Praxis Praxis: Not tested    Cognition Arousal/Alertness: Awake/alert Behavior During Therapy:  WFL for tasks assessed/performed Overall Cognitive Status: Within Functional Limits for tasks assessed                    General Comments VSS on RA    Pertinent Vitals/ Pain        Pain Assessment Pain Assessment: Faces Faces Pain Scale: Hurts little more Pain Location: Back Pain Descriptors / Indicators: Operative site guarding, Sore Pain Intervention(s): Limited activity within patient's tolerance   Frequency  Min 2X/week        Progress Toward Goals  OT Goals(current goals can now be found in the care plan section)  Progress towards OT goals: Progressing toward goals  Acute Rehab OT Goals Patient Stated Goal: to go home OT Goal Formulation: With patient Time For Goal Achievement: 08/11/21 Potential to Achieve Goals: Good ADL Goals Pt Will Perform Lower Body Bathing: with modified independence;sitting/lateral leans;sit to/from stand Pt Will Perform Lower Body Dressing: with modified independence;sitting/lateral leans;sit to/from stand Pt Will Transfer to Toilet: with modified independence;ambulating Pt Will Perform Toileting - Clothing Manipulation and hygiene: with modified independence;sitting/lateral leans;sit to/from stand Additional ADL Goal #1: Pt will follow 3/3 spinal precautions 100% of the time. Additional ADL Goal #2: Pt will tolerate standing ADL task for 5 mins to improve activity tolerance.  Plan Discharge plan remains appropriate       AM-PAC OT "6 Clicks" Daily Activity     Outcome Measure   Help from another person eating meals?: None Help from another person taking care of personal grooming?: None Help from another person toileting, which includes using toliet, bedpan, or urinal?: A Little Help from another person bathing (including washing, rinsing, drying)?: A Little Help from another person to put on and taking off regular upper body clothing?: None Help from another person to put on and taking off regular lower body clothing?: A Little 6 Click Score: 21    End of Session Equipment Utilized During Treatment: Rolling walker (2 wheels);Back brace  OT Visit Diagnosis: Unsteadiness on feet (R26.81);Other abnormalities of gait  and mobility (R26.89);Muscle weakness (generalized) (M62.81)   Activity Tolerance Patient tolerated treatment well   Patient Left in bed;with call bell/phone within reach   Nurse Communication Mobility status        Time: 5701-7793 OT Time Calculation (min): 14 min  Charges: OT General Charges $OT Visit: 1 Visit OT Treatments $Self Care/Home Management : 8-22 mins   Vincent Ehrler A Bolden Hagerman 07/29/2021, 8:57 AM

## 2021-07-29 NOTE — Progress Notes (Signed)
Physical Therapy Treatment Patient Details Name: Katie Owens MRN: [id number] DOB: January 19, 1951 Today's Date: 07/29/2021   History of Present Illness Pt is a 71 y/o female who presents s/p L3-L4 PLIF on 07/27/2021. PMH significant for L breast CA, CKD III, COVID Jan 2022, DM II, fibromyalgia, HTN, loop recorder 2017, Lumbar fusion 2011, R carpal tunnel release 2014.    PT Comments    Pt tolerates treatment well, demonstrating improved stair negotiation with UE support of cane and railing. Pt is able to tolerate increased ambulation distances and reports improved comfort with mobility. Pt will continue to benefit from frequent mobilization and gait training to improve endurance.   Recommendations for follow up therapy are one component of a multi-disciplinary discharge planning process, led by the attending physician.  Recommendations may be updated based on patient status, additional functional criteria and insurance authorization.  Follow Up Recommendations  Home health PT     Assistance Recommended at Discharge Intermittent Supervision/Assistance  Patient can return home with the following Help with stairs or ramp for entrance   Equipment Recommendations  None recommended by PT    Recommendations for Other Services       Precautions / Restrictions Precautions Precautions: Fall;Back Precaution Booklet Issued: Yes (comment) Precaution Comments: verbally reviewed back precautions Required Braces or Orthoses: Spinal Brace Spinal Brace: Lumbar corset (on upon arrival) Restrictions Weight Bearing Restrictions: No     Mobility  Bed Mobility                    Transfers Overall transfer level: Needs assistance Equipment used: Rollator (4 wheels) Transfers: Sit to/from Stand, Bed to chair/wheelchair/BSC Sit to Stand: Supervision   Step pivot transfers: Supervision       General transfer comment: verbal cues to lock brakes before sitting on rollator. Pt retains cues  for future breaks    Ambulation/Gait Ambulation/Gait assistance: Supervision Gait Distance (Feet): 100 Feet (100' x 2 trials) Assistive device: Rollator (4 wheels) Gait Pattern/deviations: Step-through pattern Gait velocity: functional Gait velocity interpretation: 1.31 - 2.62 ft/sec, indicative of limited community ambulator   General Gait Details: pt with steady step-through gait, taking seated rest break on 4 wheeled walker when UE fatigues   Stairs Stairs: Yes Stairs assistance: Min guard Stair Management: One rail Right, With cane, Step to pattern Number of Stairs: 16 General stair comments: cues for cane placement   Wheelchair Mobility    Modified Rankin (Stroke Patients Only)       Balance Overall balance assessment: Needs assistance Sitting-balance support: No upper extremity supported, Feet supported Sitting balance-Leahy Scale: Good     Standing balance support: During functional activity, Single extremity supported, Bilateral upper extremity supported, Reliant on assistive device for balance Standing balance-Leahy Scale: Fair                              Cognition Arousal/Alertness: Awake/alert Behavior During Therapy: WFL for tasks assessed/performed Overall Cognitive Status: Within Functional Limits for tasks assessed                                          Exercises      General Comments General comments (skin integrity, edema, etc.): VSS on RA      Pertinent Vitals/Pain Pain Assessment Pain Assessment: Faces Faces Pain Scale: Hurts little more Pain Location: back Pain  Descriptors / Indicators: Grimacing Pain Intervention(s): Monitored during session    Home Living                          Prior Function            PT Goals (current goals can now be found in the care plan section) Acute Rehab PT Goals Patient Stated Goal: Be able to manage at home with intermittent support Progress towards PT  goals: Progressing toward goals    Frequency    Min 5X/week      PT Plan Current plan remains appropriate    Co-evaluation              AM-PAC PT "6 Clicks" Mobility   Outcome Measure  Help needed turning from your back to your side while in a flat bed without using bedrails?: A Little Help needed moving from lying on your back to sitting on the side of a flat bed without using bedrails?: A Little Help needed moving to and from a bed to a chair (including a wheelchair)?: A Little Help needed standing up from a chair using your arms (e.g., wheelchair or bedside chair)?: None Help needed to walk in hospital room?: A Little Help needed climbing 3-5 steps with a railing? : A Little 6 Click Score: 19    End of Session Equipment Utilized During Treatment: Back brace Activity Tolerance: Patient tolerated treatment well Patient left: in bed;with call bell/phone within reach Nurse Communication: Mobility status PT Visit Diagnosis: Unsteadiness on feet (R26.81);Pain     Time: 4401-0272 PT Time Calculation (min) (ACUTE ONLY): 30 min  Charges:  $Gait Training: 23-37 mins                     Zenaida Niece, PT, DPT Acute Rehabilitation Pager: [phone] Office [place] 07/29/2021, 10:04 AM

## 2021-07-29 NOTE — Discharge Summary (Signed)
Physician Discharge Summary  Patient ID: Katie Owens MRN: [id number] DOB/AGE: [birth year] 72 y.o.  Admit date: 07/27/2021 Discharge date: 07/29/2021  Admission Diagnoses: Lumbar spondylolisthesis  Discharge Diagnoses:  Principal Problem:   Spondylolisthesis of lumbar region   Discharged Condition: good  Hospital Course: Dr. Ellene Route performed a extension of the patient's lumbar fusion to L3-4 on 07/27/2021.  The patient's postoperative course was remarkable for hives with taking hydrocodone.  She was changed to tramadol and did well.  On 07/29/2021 the patient requested discharge home.  She was given verbal and written discharge instructions.  All her questions were answered.  Consults: PT, OT, care management Significant Diagnostic Studies: None Treatments: L3-4 decompression, instrumentation and fusion. Discharge Exam: Blood pressure (!) 103/47, pulse 90, temperature 98.4 F (36.9 C), temperature source Oral, resp. rate 16, SpO2 99 %. The patient is alert and pleasant.  Her strength is grossly normal.  Disposition: Home  Discharge Instructions     Call MD for:  difficulty breathing, headache or visual disturbances   Complete by: As directed    Call MD for:  extreme fatigue   Complete by: As directed    Call MD for:  hives   Complete by: As directed    Call MD for:  persistant dizziness or light-headedness   Complete by: As directed    Call MD for:  persistant nausea and vomiting   Complete by: As directed    Call MD for:  redness, tenderness, or signs of infection (pain, swelling, redness, odor or green/yellow discharge around incision site)   Complete by: As directed    Call MD for:  severe uncontrolled pain   Complete by: As directed    Call MD for:  temperature >100.4   Complete by: As directed    Diet - low sodium heart healthy   Complete by: As directed    Discharge instructions   Complete by: As directed    Call [phone] for a followup appointment. Take  a stool softener while you are using pain medications.   Driving Restrictions   Complete by: As directed    Do not drive for 2 weeks.   Incentive spirometry RT   Complete by: As directed    Increase activity slowly   Complete by: As directed    Lifting restrictions   Complete by: As directed    Do not lift more than 5 pounds. No excessive bending or twisting.   May shower / Bathe   Complete by: As directed    Remove the dressing for 3 days after surgery.  You may shower, but leave the incision alone.   Remove dressing in 48 hours   Complete by: As directed       Allergies as of 07/29/2021       Reactions   Penicillins    Joints ache   Aspirin Hives, Rash   Ibuprofen Hives, Rash   Naproxen Hives, Rash        Medication List     STOP taking these medications    celecoxib 200 MG capsule Commonly known as: CELEBREX   HYDROcodone-acetaminophen 5-325 MG tablet Commonly known as: Norco       TAKE these medications    ALPRAZolam 0.5 MG tablet Commonly known as: XANAX Take 0.5 mg by mouth at bedtime.   b complex vitamins tablet Take 1 tablet by mouth daily.   beclomethasone 80 MCG/ACT inhaler Commonly known as: QVAR Inhale 1 puff into the lungs 2 (two) times daily  as needed (shortness of breath).   Biotin 5 MG Caps Take 5 mg by mouth daily.   carvedilol 25 MG tablet Commonly known as: COREG Take 1 tablet (25 mg total) by mouth 2 (two) times daily.   chlorthalidone 25 MG tablet Commonly known as: HYGROTON Take 25 mg by mouth daily.   CITRACAL PO Take 2 tablets by mouth daily.   cyclobenzaprine 10 MG tablet Commonly known as: FLEXERIL Take 10 mg by mouth at bedtime as needed for muscle spasms.   dicyclomine 20 MG tablet Commonly known as: BENTYL Take 20 mg by mouth every 8 (eight) hours as needed for spasms.   diltiazem 30 MG tablet Commonly known as: CARDIZEM Take 30 mg by mouth every 4 (four) hours as needed (for HR>100 and BP>100).    diphenhydrAMINE 25 MG tablet Commonly known as: BENADRYL Take 25 mg by mouth every 6 (six) hours as needed for allergies.   docusate sodium 100 MG capsule Commonly known as: COLACE Take 1 capsule (100 mg total) by mouth 2 (two) times daily.   DULoxetine 60 MG capsule Commonly known as: CYMBALTA Take 60 mg by mouth daily.   loperamide 2 MG capsule Commonly known as: IMODIUM Take 2-4 mg by mouth as needed for diarrhea or loose stools.   magnesium oxide 400 MG tablet Commonly known as: MAG-OX Take 1 tablet (400 mg total) by mouth daily. Please make yearly appt with Dr. [name] for March 2023 for future refills. Thank you 1st attempt   MULTIVITAMIN ADULT PO Take 1 tablet by mouth daily.   omeprazole 20 MG capsule Commonly known as: PRILOSEC Take 20 mg by mouth daily.   Ozempic (0.25 or 0.5 MG/DOSE) 2 MG/1.5ML Sopn Generic drug: Semaglutide(0.25 or 0.5MG /DOS) Inject 1 mg as directed every Monday.   potassium chloride SA 20 MEQ tablet Commonly known as: KLOR-CON M Take 20 mEq by mouth daily.   pregabalin 75 MG capsule Commonly known as: LYRICA Take 75 mg by mouth 2 (two) times daily.   Rexulti 0.5 MG Tabs Generic drug: Brexpiprazole Take 0.5 mg by mouth daily.   rosuvastatin 10 MG tablet Commonly known as: CRESTOR Take 10 mg by mouth daily after supper.   SUMAtriptan 100 MG tablet Commonly known as: IMITREX Take 100 mg by mouth as needed for migraine.   telmisartan 40 MG tablet Commonly known as: MICARDIS Take 20 mg by mouth daily.   traMADol 50 MG tablet Commonly known as: ULTRAM Take 1-2 tablets (50-100 mg total) by mouth every 6 (six) hours as needed for moderate pain or severe pain.   Ventolin HFA 108 (90 Base) MCG/ACT inhaler Generic drug: albuterol Inhale 1-2 puffs into the lungs every 6 (six) hours as needed for wheezing or shortness of breath.   Vitamin D 50 MCG (2000 UT) tablet Take 2,000 Units by mouth daily.        Follow-up Information      Care, [facility] Follow up.   Specialty: [place] Why: A representative from [facility] will contact you to arrange start date and time for your therapy. Contact information: [address] [place] [address] [phone]                 Signed: Ophelia Charter 07/29/2021, 8:58 AM

## 2021-07-29 NOTE — Progress Notes (Signed)
Patient alert and oriented, mae's well, voiding adequate amount of urine, swallowing without difficulty, no c/o pain at time of discharge. Patient discharged home with Friends and family. Script and discharged instructions given to patient. Patient and family stated understanding of instructions given. Patient has an appointment with Dr.Elsner in 3 weeks

## 2021-07-31 ENCOUNTER — Encounter (HOSPITAL_COMMUNITY): Payer: Self-pay | Admitting: Neurological Surgery

## 2021-08-21 ENCOUNTER — Telehealth: Payer: Self-pay | Admitting: Internal Medicine

## 2021-08-21 NOTE — Telephone Encounter (Signed)
Pt scheduled for follow up with RU.  No call back received from PT with [facility].  Await further needs.

## 2021-08-21 NOTE — Telephone Encounter (Signed)
Left detailed message for PT with [facility].  Pt is overdue for follow up with RU.  Will give to scheduling.  Requested call back from PT if needed.

## 2021-08-21 NOTE — Telephone Encounter (Signed)
Therapist calling in because he seeing the patient. Has bp drop once she stand up about 10-15 pts. Patient says no lightheadness or dizziness but does say she has thrubbing in her head for about 15 secs. Patient is not drinking enough water and just started take UTI medication.

## 2021-09-17 NOTE — Progress Notes (Deleted)
? ?Cardiology Office Note ?Date:  09/17/2021  ?Patient ID:  Katie Owens Mar 18, 1951, MRN [id number] ?PCP:  Derinda Late, MD  ?Electrophysiologist:  Dr. [name] ?  ?Chief Complaint:  *** 6 mo ? ?History of Present Illness: ?Katie Owens is a 71 y.o. female with history of AFib s/p afib ablation at [facility] by Dr Clyda Hurdle in 2012, DM, fibromyalgia, HTN, HLD, OSA w/CPAP.  ? ?She saw Dr. [name] spring 2018, at that time discussed her symptoms of palpitations prompted ILR implant, to date of that visit, palpitations were attributed to PVCs, a brief ATach had been observed, no AFib.  The patient opted to stop Xarelto at that time given no AFib.  Understanding that if AF were to be noted by her ILR would need to resume. ?A  subsequent telephone note a pre-syncopal spell with brief ATach. ? ? ?She was seen in the ER in April 2019 for a near syncopal event, no clear etiology was noted, symptoms resolved and discharged.   ?She reported she had drank more then usual Ice Tea and thinks the extra caffeine, shrimp in the food she had made her BP high and feel poorly, her PMD resumed her chlorthalidone and mentions he changed her other BP medicine to Irbesartan as well. I saw after this she felt like her heart rate was faster then usual the day prior, maybe slightly lightheaded, she was seated, and self resolved quickly.   She denied orthostatic symptoms.  She had not felt like she hash had any AFib.  This being a very specific and different symptoms then her "faster rates".  Denied CP or SOB.  No syncope. ?ILR had no observations, no arrhythmias. ? ?She has not been seen again. ?Device clinic noted device had reached RRT, discussed with the patient, she preferred to keep the device in. ? ?08/20/19: device clinic f/u note by Dr. [name] noted  ?Sinus with frequent ectopy/ disorganized atrial activity ?Continue to monitor without change at this time ? ?I saw her 10/21/19 ?She mentions that for a couple months she just has no  energy, just does not feel like doing anything at all.  She suffers with depression and felt like this could be the cause and her medicines have been adjusted by her PMD.  Though has not gotten much improvement.  She has little exertional capacity,  gets winded with any increased exertional activities, even casual walking if it is of any distance, this is her baseline.  That being said, since she retired in 2017, spends much of her time home seated.  She does not exercise, never has.  She has stopped using her CPAP for no clear reason, just does not wear it.  She denies any kind of CP, no rest SOB, no dizzy spells near syncope or syncope, outside if the occasional fleeting lighteaded feeling when standing. ?She has occasional palpitations that are a feeling of a stop/missed beat and starts up, nothing persistent, long lasting.  She suspects these are her PVCs ?She has not felt like she has had any AFib ? ?Her loop was RRT, she preferred to keep it in place if ot to be replaced with another. ?No device observation since the already evaluated episode on 08/05/19 described as disorganized atrial activity (77mnute duration) ?03/22/2018 the only other since her last programmer check and this was ST with PVCs, no true AFib in review of EGM ?I urged her to resume using her CPAP and follow up with her PMD regrding her suspicion of  depression not adequately managed. ? ? ?Pending lap-chole, pending scheduling ?She has seen pulmonary and seems to be working on getting CPAP and sleep hygiene. ? ?I saw her march 2022 ?She is doing well. ?Does not exercise 2/2 back/sciatic pain, but last week started PT, doing stretching exercises and one strength exercise and feels like it is helping her already! ?She cares for her home, chores etc without exertional intolerances, lives in a condo, walks 2 flights of stairs to get to her apartment, does this usually 2x a day. ?No changes with her exertional capaity or unusual DOE, no CP. ?No dizzy  spells, near syncope or syncope. ?She has infrequent awareness of a skip in her heart beat, this is a second or two, no palpitations otherwise, no symptoms of AFib  ?Noted with some PVCs, known for her ?Planned labs/lytes echo ?RCRI is zero (0.4%) ?Felt an acceptable risk for GB surgery ?Planned for 6 mo visit ? ?Had back surgery 07/27/21 ? ?Recent notes 08/21/21, with reports of orthostatic BPs, currently on abx for UTI, reported sub-optimal water intake ? ?*** symptoms ?*** orthostatic? ?*** PVCs ?*** AFib?  Not on a/c ?  ? ?Device information: ?MDT ILR implanted 03/08/16, Dr. 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 ?Past Medical History:  ?Diagnosis Date  ? Abdominal wall mass 12/09/2020  ? lipoma, s/p excision 12/15/20 at time of cholecystectomy  ? Arthritis 12/09/2020  ? oa  ? Asthma   ? Cancer ([place]) 07/02/1993  ? breast cancer lt with 12 nodes out, chemo and radiation done  ? Chronic cholecystitis 12/09/2020  ? s/p cholecystectomy 12/15/20  ? chronic kidney disease 12/09/2020  ? stage 3 per pt  ? Contact lens/glasses fitting   ? wears contacts or glasses  ? COVID   ? jan  2022 cold and headache x 7 -8 days/all symptoms resolved per pt  ? Depression   ? 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  ? Dysrhythmia   ? hx AF had ablation in 2012, has occ pvc's per pt  ? Fatty liver   ? Fibromyalgia   ? GERD (gastroesophageal reflux disease)   ? History of kidney stones   ? passed on own in past per pt  ? Hyperlipemia   ? Hypertension   ? IBS (irritable bowel syndrome)   ? migraines 12/09/2020  ? with weather changes per pt  ? PONV (postoperative nausea and vomiting)   ? Sciatica 12/09/2020  ? right butt cheek and right leg and lower spine  ? Seasonal allergies   ? Shortness of breath 12/09/2020  ? on exertion  ? Sleep apnea   ? cpap broken waiting on new cpap, severe osa last sleep study home few months ago ( 12-09-2020 )  ? Uses walker 12/09/2020  ? left walker with friend out of the area  ? Wears glasses 12/09/2020  ? ? ?Past Surgical History:  ?Procedure  Laterality Date  ? BREAST LUMPECTOMY WITH AXILLARY LYMPH NODE DISSECTION  07/02/1993  ? 12 nodes removed  ? CARDIAC CATHETERIZATION  01/24/2009  ? left heart  ? CARDIAC ELECTROPHYSIOLOGY MAPPING AND ABLATION  07/02/2010  ? CARPAL TUNNEL RELEASE Right 10/14/2012  ? Procedure: CARPAL TUNNEL RELEASE;  Surgeon: Wynonia Sours, MD;  Location: [facility];  Service: Orthopedics;  Laterality: Right;  ? CHOLECYSTECTOMY N/A 12/15/2020  ? Procedure: LAPAROSCOPIC CHOLECYSTECTOMY WITH INTRAOPERATIVE CHOLANGIOGRAM, TAP BLOCK;  Surgeon: Michael Boston, MD;  Location: [facility];  Service: General;  Laterality: N/A;  ? COLONOSCOPY  2021  ?  DIAGNOSTIC LAPAROSCOPY  07/02/1988  ? EP IMPLANTABLE DEVICE N/A 03/08/2016  ? Procedure: Loop Recorder Insertion;  Surgeon: Thompson Grayer, MD;  Location: [facility];  Service: Cardiovascular;  Laterality: N/A;  ? LIVER BIOPSY N/A 12/15/2020  ? Procedure: NEEDLE CORE LIVER BIOPSY;  Surgeon: Michael Boston, MD;  Location: [facility];  Service: General;  Laterality: N/A;  ? LUMBAR FUSION  2011  ? MASS EXCISION N/A 12/15/2020  ? Procedure: EXCISION MASS ABDOMINAL WALL;  Surgeon: Michael Boston, MD;  Location: [facility];  Service: General;  Laterality: N/A;  ? TONSILLECTOMY    ? age 20 per pt  ? ? ?Current Outpatient Medications  ?Medication Sig Dispense Refill  ? ALPRAZolam (XANAX) 0.5 MG tablet Take 0.5 mg by mouth at bedtime.  1  ? b complex vitamins tablet Take 1 tablet by mouth daily.    ? beclomethasone (QVAR) 80 MCG/ACT inhaler Inhale 1 puff into the lungs 2 (two) times daily as needed (shortness of breath).    ? Biotin 5 MG CAPS Take 5 mg by mouth daily.    ? Brexpiprazole (REXULTI) 0.5 MG TABS Take 0.5 mg by mouth daily.    ? Calcium Citrate (CITRACAL PO) Take 2 tablets by mouth daily.    ? carvedilol (COREG) 25 MG tablet Take 1 tablet (25 mg total) by mouth 2 (two) times daily. 180 tablet 3  ? chlorthalidone (HYGROTON) 25 MG  tablet Take 25 mg by mouth daily.    ? Cholecalciferol (VITAMIN D) 50 MCG (2000 UT) tablet Take 2,000 Units by mouth daily.     ? cyclobenzaprine (FLEXERIL) 10 MG tablet Take 10 mg by mouth at bedtime as need

## 2021-09-20 ENCOUNTER — Ambulatory Visit: Payer: Medicare HMO | Admitting: Physician Assistant

## 2021-10-27 ENCOUNTER — Other Ambulatory Visit: Payer: Self-pay | Admitting: Family Medicine

## 2021-10-27 DIAGNOSIS — R7401 Elevation of levels of liver transaminase levels: Secondary | ICD-10-CM

## 2021-11-02 ENCOUNTER — Ambulatory Visit
Admission: RE | Admit: 2021-11-02 | Discharge: 2021-11-02 | Disposition: A | Discharge status: Home / Self Care | Payer: Medicare HMO | Source: Ambulatory Visit | Attending: Family Medicine | Admitting: Family Medicine

## 2021-11-02 DIAGNOSIS — R7401 Elevation of levels of liver transaminase levels: Secondary | ICD-10-CM

## 2021-12-09 ENCOUNTER — Other Ambulatory Visit: Payer: Self-pay | Admitting: Internal Medicine

## 2021-12-26 ENCOUNTER — Other Ambulatory Visit: Payer: Self-pay | Admitting: Internal Medicine

## 2022-01-23 ENCOUNTER — Ambulatory Visit
Admission: RE | Admit: 2022-01-23 | Discharge: 2022-01-23 | Disposition: A | Discharge status: Home / Self Care | Payer: Medicare HMO | Source: Ambulatory Visit | Attending: Family Medicine | Admitting: Family Medicine

## 2022-01-23 ENCOUNTER — Other Ambulatory Visit: Payer: Self-pay | Admitting: Family Medicine

## 2022-01-23 DIAGNOSIS — R059 Cough, unspecified: Secondary | ICD-10-CM

## 2022-04-10 ENCOUNTER — Ambulatory Visit: Payer: Medicare HMO | Admitting: Pulmonary Disease

## 2022-05-30 ENCOUNTER — Ambulatory Visit (HOSPITAL_BASED_OUTPATIENT_CLINIC_OR_DEPARTMENT_OTHER): Payer: Medicare HMO | Admitting: Pulmonary Disease

## 2022-10-02 ENCOUNTER — Ambulatory Visit (HOSPITAL_BASED_OUTPATIENT_CLINIC_OR_DEPARTMENT_OTHER): Payer: Medicare HMO | Admitting: Pulmonary Disease

## 2022-11-27 ENCOUNTER — Encounter: Payer: Self-pay | Admitting: "Endocrinology

## 2022-11-27 ENCOUNTER — Ambulatory Visit: Payer: Medicare HMO | Admitting: "Endocrinology

## 2022-11-27 VITALS — BP 128/70 | HR 88 | Ht 59.0 in | Wt 222.2 lb

## 2022-11-27 DIAGNOSIS — Z7984 Long term (current) use of oral hypoglycemic drugs: Secondary | ICD-10-CM | POA: Diagnosis not present

## 2022-11-27 DIAGNOSIS — E782 Mixed hyperlipidemia: Secondary | ICD-10-CM | POA: Diagnosis not present

## 2022-11-27 DIAGNOSIS — E1165 Type 2 diabetes mellitus with hyperglycemia: Secondary | ICD-10-CM | POA: Diagnosis not present

## 2022-11-27 LAB — POCT GLYCOSYLATED HEMOGLOBIN (HGB A1C): Hemoglobin A1C: 9.1 % — AB (ref 4.0–5.6)

## 2022-11-27 MED ORDER — TRULICITY 0.75 MG/0.5ML ~~LOC~~ SOAJ: 0.7500 mg | SUBCUTANEOUS | 0 refills | Status: AC

## 2022-11-27 MED ORDER — BLOOD GLUCOSE MONITORING SUPPL DEVI: 1.0000 | Freq: Three times a day (TID) | 0 refills | Status: AC

## 2022-11-27 MED ORDER — LANCETS MISC. MISC: 1.0000 | Freq: Three times a day (TID) | 3 refills | Status: AC

## 2022-11-27 MED ORDER — GLIPIZIDE ER 5 MG PO TB24: 5.0000 mg | ORAL_TABLET | Freq: Every day | ORAL | 2 refills | Status: DC

## 2022-11-27 MED ORDER — LANCET DEVICE MISC: 1.0000 | Freq: Three times a day (TID) | 0 refills | Status: AC

## 2022-11-27 MED ORDER — BLOOD GLUCOSE TEST VI STRP: 1.0000 | ORAL_STRIP | Freq: Three times a day (TID) | 3 refills | Status: AC

## 2022-11-27 NOTE — Addendum Note (Signed)
Addended byAltamese Schlater on: 11/27/2022 01:54 PM   Modules accepted: Orders

## 2022-11-27 NOTE — Progress Notes (Addendum)
Outpatient Endocrinology Note Katie [place], MD  11/27/22   Katie Owens [birth year] [id number]  Referring Provider: Mosetta Putt, MD Primary Care Provider: Mosetta Putt, MD Reason for consultation: Subjective   Assessment & Plan  Diagnoses and all orders for this visit:  Uncontrolled type 2 diabetes mellitus with hyperglycemia (HCC) -     POCT glycosylated hemoglobin (Hb A1C) -     Comprehensive metabolic panel; Future -     Lipid panel; Future -     Microalbumin / creatinine urine ratio; Future -     Hemoglobin A1c; Future  Long term (current) use of oral hypoglycemic drugs  Mixed hypercholesterolemia and hypertriglyceridemia  Other orders -     Dulaglutide (TRULICITY) 0.75 MG/0.5ML SOPN; Inject 0.75 mg into the skin once a week. -     glipiZIDE (GLUCOTROL XL) 5 MG 24 hr tablet; Take 1 tablet (5 mg total) by mouth daily with breakfast. -     Blood Glucose Monitoring Suppl DEVI; 1 each by Does not apply route in the morning, at noon, and at bedtime. May substitute to any manufacturer covered by patient's insurance. -     Glucose Blood (BLOOD GLUCOSE TEST STRIPS) STRP; 1 each by In Vitro route in the morning, at noon, and at bedtime. May substitute to any manufacturer covered by patient's insurance. -     Lancet Device MISC; 1 each by Does not apply route in the morning, at noon, and at bedtime. May substitute to any manufacturer covered by patient's insurance. -     Lancets Misc. MISC; 1 each by Does not apply route in the morning, at noon, and at bedtime. May substitute to any manufacturer covered by patient's insurance.    Diabetes complicated by neuropathy, nephropathy  Hba1c goal less than 7.0, current Hba1c is 9.1. Will recommend for the following change of medications to: Continue metformin XR 500 mg twice daily Start Trulicity 0.75 mg weekly Start glipizide XL 5 mg daily, hold if blood sugar less than 150  Patient previously tolerated Ozempic well,  unable to afford now  No known contraindications to any of above medications No history of MEN syndrome/medullary thyroid cancer/pancreatitis or pancreatic cancer in self or family   Hyperlipidemia -Last LDL off goal: 90 -on rosuvastatin 10 mg QD -Follow low fat diet and exercise    -Blood pressure goal <140/90 - Microalbumin/creatinine goal < 30, GFR low - on ACE/ARB Telmisartan 40 mg qd: defer to nephrology  -diet changes including salt restriction -limit eating outside -counseled BP targets per standards of diabetes care -Uncontrolled blood pressure can lead to retinopathy, nephropathy and cardiovascular and atherosclerotic heart disease  Reviewed and counseled on: -A1C target -Blood sugar targets -Complications of uncontrolled diabetes  -Checking blood sugar before meals and bedtime and bring log next visit -All medications with mechanism of action and side effects -Hypoglycemia management: rule of 15's, Glucagon Emergency Kit and medical alert ID -low-carb low-fat plate-method diet -At least 20 minutes of physical activity per day -Annual dilated retinal eye exam and foot exam -compliance and follow up needs -follow up as scheduled or earlier if problem gets worse  Call if blood sugar is less than 70 or consistently above 250    Take a 15 gm snack of carbohydrate at bedtime before you go to sleep if your blood sugar is less than 100.    If you are going to fast after midnight for a test or procedure, ask your physician for instructions on how  to reduce/decrease your insulin dose.    Call if blood sugar is less than 70 or consistently above 250  -Treating a low sugar by rule of 15  (15 gms of sugar every 15 min until sugar is more than 70) If you feel your sugar is low, test your sugar to be sure If your sugar is low (less than 70), then take 15 grams of a fast acting Carbohydrate (3-4 glucose tablets or glucose gel or 4 ounces of juice or regular soda) Recheck your  sugar 15 min after treating low to make sure it is more than 70 If sugar is still less than 70, treat again with 15 grams of carbohydrate          Don't drive the hour of hypoglycemia  If unconscious/unable to eat or drink by mouth, use glucagon injection or nasal spray baqsimi and call 911. Can repeat again in 15 min if still unconscious.  Return in about 6 weeks (around 01/09/2023).   I have reviewed current medications, nurse's notes, allergies, vital signs, past medical and surgical history, family medical history, and social history for this encounter. Counseled patient on symptoms, examination findings, lab findings, imaging results, treatment decisions and monitoring and prognosis. The patient understood the recommendations and agrees with the treatment plan. All questions regarding treatment plan were fully answered.  Katie [place], MD  11/27/22    History of Present Illness Katie Owens is a 72 y.o. year old female who presents for evaluation of Type 2 diabetes mellitus.  IVON WIGINTON was first diagnosed in 2014.   Diabetes education +  Home diabetes regimen: Metformin ER 500 mg bid (1000 bid lead to diarrhea)  Cannot afford ozempic - no issues   COMPLICATIONS -  MI/Stroke -  retinopathy +  neuropathy +  nephropathy  SYMPTOMS REVIEWED - Polyuria - Weight loss - Blurred vision  BLOOD SUGAR DATA Per meter, checks 3 times a day Range: 110-240  Referral reviewed 09/11/2021 ALT 54 AST 6 1 GFR 56 A1c 7.1 LDL 90 HDL 55 Total cholesterol 122 TG 177  Physical Exam  BP 128/70   Pulse 88   Ht 4\' 11"  (1.499 m)   Wt 222 lb 3.2 oz (100.8 kg)   SpO2 97%   BMI 44.88 kg/m    Constitutional: well developed, well nourished Head: normocephalic, atraumatic Eyes: sclera anicteric, no redness Neck: supple Lungs: normal respiratory effort Neurology: alert and oriented Skin: dry, no appreciable rashes Musculoskeletal: no appreciable defects Psychiatric: normal  mood and affect Diabetic Foot Exam - Simple   No data filed      Current Medications Patient's Medications  New Prescriptions   BLOOD GLUCOSE MONITORING SUPPL DEVI    1 each by Does not apply route in the morning, at noon, and at bedtime. May substitute to any manufacturer covered by patient's insurance.   DULAGLUTIDE (TRULICITY) 0.75 MG/0.5ML SOPN    Inject 0.75 mg into the skin once a week.   GLIPIZIDE (GLUCOTROL XL) 5 MG 24 HR TABLET    Take 1 tablet (5 mg total) by mouth daily with breakfast.   GLUCOSE BLOOD (BLOOD GLUCOSE TEST STRIPS) STRP    1 each by In Vitro route in the morning, at noon, and at bedtime. May substitute to any manufacturer covered by patient's insurance.   LANCET DEVICE MISC    1 each by Does not apply route in the morning, at noon, and at bedtime. May substitute to any manufacturer covered by patient's insurance.  LANCETS MISC. MISC    1 each by Does not apply route in the morning, at noon, and at bedtime. May substitute to any manufacturer covered by patient's insurance.  Previous Medications   ALPRAZOLAM (XANAX) 0.5 MG TABLET    Take 0.5 mg by mouth at bedtime.   B COMPLEX VITAMINS TABLET    Take 1 tablet by mouth daily.   BECLOMETHASONE (QVAR) 80 MCG/ACT INHALER    Inhale 1 puff into the lungs 2 (two) times daily as needed (shortness of breath).   BIOTIN 5 MG CAPS    Take 5 mg by mouth daily.   BREXPIPRAZOLE (REXULTI) 0.5 MG TABS    Take 0.5 mg by mouth daily.   CALCIUM CITRATE (CITRACAL PO)    Take 2 tablets by mouth daily.   CARVEDILOL (COREG) 25 MG TABLET    Take 1 tablet (25 mg total) by mouth 2 (two) times daily.   CHLORTHALIDONE (HYGROTON) 25 MG TABLET    Take 25 mg by mouth daily.   CHOLECALCIFEROL (VITAMIN D) 50 MCG (2000 UT) TABLET    Take 2,000 Units by mouth daily.    CYCLOBENZAPRINE (FLEXERIL) 10 MG TABLET    Take 10 mg by mouth at bedtime as needed for muscle spasms.   DICYCLOMINE (BENTYL) 20 MG TABLET    Take 20 mg by mouth every 8 (eight) hours as  needed for spasms.   DILTIAZEM (CARDIZEM) 30 MG TABLET    Take 30 mg by mouth every 4 (four) hours as needed (for HR>100 and BP>100).   DIPHENHYDRAMINE (BENADRYL) 25 MG TABLET    Take 25 mg by mouth every 6 (six) hours as needed for allergies.   DOCUSATE SODIUM (COLACE) 100 MG CAPSULE    Take 1 capsule (100 mg total) by mouth 2 (two) times daily.   DULOXETINE (CYMBALTA) 60 MG CAPSULE    Take 60 mg by mouth daily.   LOPERAMIDE (IMODIUM) 2 MG CAPSULE    Take 2-4 mg by mouth as needed for diarrhea or loose stools.   MAGNESIUM OXIDE (MAG-OX) 400 (240 MG) MG TABLET    TAKE 1 TABLET BY MOUTH EVERY DAY   MULTIPLE VITAMINS-MINERALS (MULTIVITAMIN ADULT PO)    Take 1 tablet by mouth daily.   OMEPRAZOLE (PRILOSEC) 20 MG CAPSULE    Take 20 mg by mouth daily.   POTASSIUM CHLORIDE SA (K-DUR,KLOR-CON) 20 MEQ TABLET    Take 20 mEq by mouth daily.    PREGABALIN (LYRICA) 75 MG CAPSULE    Take 75 mg by mouth 2 (two) times daily.   ROSUVASTATIN (CRESTOR) 10 MG TABLET    Take 10 mg by mouth daily after supper.   SUMATRIPTAN (IMITREX) 100 MG TABLET    Take 100 mg by mouth as needed for migraine.   TELMISARTAN (MICARDIS) 40 MG TABLET    Take 20 mg by mouth daily.   TRAMADOL (ULTRAM) 50 MG TABLET    Take 1-2 tablets (50-100 mg total) by mouth every 6 (six) hours as needed for moderate pain or severe pain.   VENTOLIN HFA 108 (90 BASE) MCG/ACT INHALER    Inhale 1-2 puffs into the lungs every 6 (six) hours as needed for wheezing or shortness of breath.   Modified Medications   No medications on file  Discontinued Medications   OZEMPIC, 0.25 OR 0.5 MG/DOSE, 2 MG/1.5ML SOPN    Inject 1 mg as directed every Monday.    Allergies Allergies  Allergen Reactions   Penicillins  Joints ache   Aspirin Hives and Rash   Ibuprofen Hives and Rash   Naproxen Hives and Rash    Past Medical History Past Medical History:  Diagnosis Date   Abdominal wall mass 12/09/2020   lipoma, s/p excision 12/15/20 at time of  cholecystectomy   Arthritis 12/09/2020   oa   Asthma    Cancer (HCC) 07/02/1993   breast cancer lt with 12 nodes out, chemo and radiation done   Chronic cholecystitis 12/09/2020   s/p cholecystectomy 12/15/20   chronic kidney disease 12/09/2020   stage 3 per pt   Contact lens/glasses fitting    wears contacts or glasses   COVID    jan  2022 cold and headache x 7 -8 days/all symptoms resolved per pt   Depression    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   Dysrhythmia    hx AF had ablation in 2012, has occ pvc's per pt   Fatty liver    Fibromyalgia    GERD (gastroesophageal reflux disease)    History of kidney stones    passed on own in past per pt   Hyperlipemia    Hypertension    IBS (irritable bowel syndrome)    migraines 12/09/2020   with weather changes per pt   PONV (postoperative nausea and vomiting)    Sciatica 12/09/2020   right butt cheek and right leg and lower spine   Seasonal allergies    Shortness of breath 12/09/2020   on exertion   Sleep apnea    cpap broken waiting on new cpap, severe osa last sleep study home few months ago ( 12-09-2020 )   Uses walker 12/09/2020   left walker with friend out of the area   Wears glasses 12/09/2020    Past Surgical History Past Surgical History:  Procedure Laterality Date   BREAST LUMPECTOMY WITH AXILLARY LYMPH NODE DISSECTION  07/02/1993   12 nodes removed   CARDIAC CATHETERIZATION  01/24/2009   left heart   CARDIAC ELECTROPHYSIOLOGY MAPPING AND ABLATION  07/02/2010   CARPAL TUNNEL RELEASE Right 10/14/2012   Procedure: CARPAL TUNNEL RELEASE;  Surgeon: Nicki Reaper, MD;  Location: [facility];  Service: Orthopedics;  Laterality: Right;   CHOLECYSTECTOMY N/A 12/15/2020   Procedure: LAPAROSCOPIC CHOLECYSTECTOMY WITH INTRAOPERATIVE CHOLANGIOGRAM, TAP BLOCK;  Surgeon: Karie Soda, MD;  Location: [facility];  Service: General;  Laterality: N/A;   COLONOSCOPY  2021   DIAGNOSTIC LAPAROSCOPY  07/02/1988   EP  IMPLANTABLE DEVICE N/A 03/08/2016   Procedure: Loop Recorder Insertion;  Surgeon: Hillis Range, MD;  Location: [facility];  Service: Cardiovascular;  Laterality: N/A;   LIVER BIOPSY N/A 12/15/2020   Procedure: NEEDLE CORE LIVER BIOPSY;  Surgeon: Karie Soda, MD;  Location: [facility];  Service: General;  Laterality: N/A;   LUMBAR FUSION  2011   MASS EXCISION N/A 12/15/2020   Procedure: EXCISION MASS ABDOMINAL WALL;  Surgeon: Karie Soda, MD;  Location: [facility];  Service: General;  Laterality: N/A;   TONSILLECTOMY     age 24 per pt    Family History family history includes Alcoholism in her brother, father, and mother; Anemia in her brother; Heart attack in her maternal grandmother; Heart disease in her brother; Hyperlipidemia in her brother and maternal grandmother; Hypertension in her brother, maternal grandmother, and mother; Other in her brother; Stroke in her mother.  Social History Social History   Socioeconomic History   Marital status: Divorced  Spouse name: Not on file   Number of children: Not on file   Years of education: Not on file   Highest education level: Not on file  Occupational History   Not on file  Tobacco Use   Smoking status: Former    Packs/day: 2.00    Years: 15.00    Additional pack years: 0.00    Total pack years: 30.00    Types: Cigarettes    Quit date: 10/08/1979    Years since quitting: 43.1   Smokeless tobacco: Never  Vaping Use   Vaping Use: Never used  Substance and Sexual Activity   Alcohol use: No    Alcohol/week: 0.0 standard drinks of alcohol   Drug use: No   Sexual activity: Not on file  Other Topics Concern   Not on file  Social History Narrative   Not on file   Social Determinants of Health   Financial Resource Strain: Not on file  Food Insecurity: Not on file  Transportation Needs: Not on file  Physical Activity: Not on file  Stress: Not on file  Social Connections: Not on file   Intimate Partner Violence: Not on file    Lab Results  Component Value Date   HGBA1C 9.1 (A) 11/27/2022   HGBA1C 7.6 (H) 07/25/2021   HGBA1C (H) 01/23/2009    6.2 (NOTE) The ADA recommends the following therapeutic goal for glycemic control related to Hgb A1c measurement: Goal of therapy: <6.5 Hgb A1c  Reference: American Diabetes Association: Clinical Practice Recommendations 2010, Diabetes Care, 2010, 33: (Suppl  1).   Lab Results  Component Value Date   CHOL  01/24/2009    140        ATP III CLASSIFICATION:  <200     mg/dL   Desirable  161-096  mg/dL   Borderline High  >=045    mg/dL   High          Lab Results  Component Value Date   HDL 61 01/24/2009   Lab Results  Component Value Date   LDLCALC  01/24/2009    58        Total Cholesterol/HDL:CHD Risk Coronary Heart Disease Risk Table                     Men   Women  1/2 Average Risk   3.4   3.3  Average Risk       5.0   4.4  2 X Average Risk   9.6   7.1  3 X Average Risk  23.4   11.0        Use the calculated Patient Ratio above and the CHD Risk Table to determine the patient's CHD Risk.        ATP III CLASSIFICATION (LDL):  <100     mg/dL   Optimal  409-811  mg/dL   Near or Above                    Optimal  130-159  mg/dL   Borderline  914-782  mg/dL   High  >956     mg/dL   Very High   Lab Results  Component Value Date   TRIG 106 01/24/2009   Lab Results  Component Value Date   CHOLHDL 2.3 01/24/2009   Lab Results  Component Value Date   CREATININE 1.29 (H) 07/28/2021   No results found for: "GFR" No results found for: "MICROALBUR", "MALB24HUR"    Component Value  Date/Time   NA 132 (L) 07/28/2021 0632   NA 138 10/03/2020 1511   K 4.1 07/28/2021 0632   CL 99 07/28/2021 0632   CO2 23 07/28/2021 0632   GLUCOSE 250 (H) 07/28/2021 0632   BUN 16 07/28/2021 0632   BUN 13 10/03/2020 1511   CREATININE 1.29 (H) 07/28/2021 0632   CALCIUM 8.6 (L) 07/28/2021 0632   PROT 7.1 07/25/2021 1513    ALBUMIN 3.9 07/25/2021 1513   AST 74 (H) 07/25/2021 1513   ALT 44 07/25/2021 1513   ALKPHOS 100 07/25/2021 1513   BILITOT 0.4 07/25/2021 1513   GFRNONAA 44 (L) 07/28/2021 0632   GFRAA >60 10/09/2017 1956      Latest Ref Rng & Units 07/28/2021    6:32 AM 07/25/2021    3:13 PM 12/16/2020    4:40 AM  BMP  Glucose 70 - 99 mg/dL 161  096  045   BUN 8 - 23 mg/dL 16  15  21    Creatinine 0.44 - 1.00 mg/dL 4.09  8.11  9.14   Sodium 135 - 145 mmol/L 132  135  134   Potassium 3.5 - 5.1 mmol/L 4.1  4.2  3.8   Chloride 98 - 111 mmol/L 99  99  103   CO2 22 - 32 mmol/L 23  23  24    Calcium 8.9 - 10.3 mg/dL 8.6  9.6  8.8        Component Value Date/Time   WBC 11.2 (H) 07/28/2021 0632   RBC 3.89 07/28/2021 0632   HGB 11.3 (L) 07/28/2021 0632   HCT 33.9 (L) 07/28/2021 0632   PLT 176 07/28/2021 0632   MCV 87.1 07/28/2021 0632   MCH 29.0 07/28/2021 0632   MCHC 33.3 07/28/2021 0632   RDW 13.3 07/28/2021 0632   LYMPHSABS 2.8 01/24/2009 0420   MONOABS 0.5 01/24/2009 0420   EOSABS 0.1 01/24/2009 0420   BASOSABS 0.0 01/24/2009 0420     Parts of this note may have been dictated using voice recognition software. There may be variances in spelling and vocabulary which are unintentional. Not all errors are proofread. Please notify the Thereasa Parkin if any discrepancies are noted or if the meaning of any statement is not clear.

## 2022-11-27 NOTE — Patient Instructions (Signed)

## 2022-12-03 ENCOUNTER — Encounter (HOSPITAL_COMMUNITY): Payer: Self-pay

## 2022-12-03 ENCOUNTER — Emergency Department (HOSPITAL_COMMUNITY)
Admission: EM | Admit: 2022-12-03 | Discharge: 2022-12-04 | Disposition: A | Discharge status: Home / Self Care | Payer: Medicare HMO | Attending: Emergency Medicine | Admitting: Emergency Medicine

## 2022-12-03 ENCOUNTER — Emergency Department (HOSPITAL_COMMUNITY): Payer: Medicare HMO

## 2022-12-03 DIAGNOSIS — R059 Cough, unspecified: Secondary | ICD-10-CM | POA: Diagnosis not present

## 2022-12-03 DIAGNOSIS — R262 Difficulty in walking, not elsewhere classified: Secondary | ICD-10-CM | POA: Diagnosis not present

## 2022-12-03 DIAGNOSIS — Z79899 Other long term (current) drug therapy: Secondary | ICD-10-CM | POA: Diagnosis not present

## 2022-12-03 DIAGNOSIS — E1165 Type 2 diabetes mellitus with hyperglycemia: Secondary | ICD-10-CM | POA: Diagnosis not present

## 2022-12-03 DIAGNOSIS — Z853 Personal history of malignant neoplasm of breast: Secondary | ICD-10-CM | POA: Insufficient documentation

## 2022-12-03 DIAGNOSIS — R350 Frequency of micturition: Secondary | ICD-10-CM | POA: Insufficient documentation

## 2022-12-03 DIAGNOSIS — E860 Dehydration: Secondary | ICD-10-CM | POA: Diagnosis not present

## 2022-12-03 DIAGNOSIS — M5442 Lumbago with sciatica, left side: Secondary | ICD-10-CM | POA: Diagnosis not present

## 2022-12-03 DIAGNOSIS — I10 Essential (primary) hypertension: Secondary | ICD-10-CM | POA: Diagnosis not present

## 2022-12-03 DIAGNOSIS — M5441 Lumbago with sciatica, right side: Secondary | ICD-10-CM | POA: Diagnosis not present

## 2022-12-03 DIAGNOSIS — R944 Abnormal results of kidney function studies: Secondary | ICD-10-CM | POA: Diagnosis not present

## 2022-12-03 DIAGNOSIS — M549 Dorsalgia, unspecified: Secondary | ICD-10-CM | POA: Diagnosis present

## 2022-12-03 DIAGNOSIS — G8929 Other chronic pain: Secondary | ICD-10-CM

## 2022-12-03 LAB — CBC WITH DIFFERENTIAL/PLATELET
Abs Immature Granulocytes: 0.04 10*3/uL (ref 0.00–0.07)
Basophils Absolute: 0 10*3/uL (ref 0.0–0.1)
Basophils Relative: 0 %
Eosinophils Absolute: 0.1 10*3/uL (ref 0.0–0.5)
Eosinophils Relative: 1 %
HCT: 40.6 % (ref 36.0–46.0)
Hemoglobin: 13.6 g/dL (ref 12.0–15.0)
Immature Granulocytes: 0 %
Lymphocytes Relative: 17 %
Lymphs Abs: 1.7 10*3/uL (ref 0.7–4.0)
MCH: 28.3 pg (ref 26.0–34.0)
MCHC: 33.5 g/dL (ref 30.0–36.0)
MCV: 84.4 fL (ref 80.0–100.0)
Monocytes Absolute: 0.7 10*3/uL (ref 0.1–1.0)
Monocytes Relative: 7 %
Neutro Abs: 7.7 10*3/uL (ref 1.7–7.7)
Neutrophils Relative %: 75 %
Platelets: 189 10*3/uL (ref 150–400)
RBC: 4.81 MIL/uL (ref 3.87–5.11)
RDW: 13.3 % (ref 11.5–15.5)
WBC: 10.2 10*3/uL (ref 4.0–10.5)
nRBC: 0 % (ref 0.0–0.2)

## 2022-12-03 LAB — COMPREHENSIVE METABOLIC PANEL
ALT: 31 U/L (ref 0–44)
AST: 31 U/L (ref 15–41)
Albumin: 3.6 g/dL (ref 3.5–5.0)
Alkaline Phosphatase: 119 U/L (ref 38–126)
Anion gap: 12 (ref 5–15)
BUN: 16 mg/dL (ref 8–23)
CO2: 23 mmol/L (ref 22–32)
Calcium: 10 mg/dL (ref 8.9–10.3)
Chloride: 98 mmol/L (ref 98–111)
Creatinine, Ser: 1.12 mg/dL — ABNORMAL HIGH (ref 0.44–1.00)
GFR, Estimated: 52 mL/min — ABNORMAL LOW (ref >60)
Glucose, Bld: 243 mg/dL — ABNORMAL HIGH (ref 70–99)
Potassium: 3.9 mmol/L (ref 3.5–5.1)
Sodium: 133 mmol/L — ABNORMAL LOW (ref 135–145)
Total Bilirubin: 0.6 mg/dL (ref 0.3–1.2)
Total Protein: 7 g/dL (ref 6.5–8.1)

## 2022-12-03 NOTE — ED Triage Notes (Signed)
Pt BIBA from home. Pt c/o lower back pain x 1 month . Pt has hx of back issues- surgery,etc. Last 2 days pain is worse, along with frequent, foul smelling urine.  174/118 96 HR 95% RA 18 RR 222 CBG

## 2022-12-03 NOTE — ED Provider Triage Note (Signed)
Emergency Medicine Provider Triage Evaluation Note  Katie Owens , a 72 y.o. female  was evaluated in triage.  Pt complains of syncope.  Review of Systems  Positive:  Negative:   Physical Exam  BP 139/82 (BP Location: Right Arm)   Pulse 96   Temp 98.1 F (36.7 C) (Oral)   Resp 16   SpO2 94%  Gen:   Awake, no distress   Resp:  Normal effort  MSK:   Moves extremities without difficulty  Other:    Medical Decision Making  Medically screening exam initiated at 6:19 PM.  Appropriate orders placed.  Katie Owens was informed that the remainder of the evaluation will be completed by another provider, this initial triage assessment does not replace that evaluation, and the importance of remaining in the ED until their evaluation is complete.   Patient stating that she has been losing consciousness multiple times over the past  weeks. Endorses coughing, dysuria. Denies fevers, hematuria, chest pain, dyspnea, nausea, vomiting, diarrhea, seizures, headache.   Dorthy Cooler, [place] 12/03/22 [phone]

## 2022-12-04 ENCOUNTER — Other Ambulatory Visit: Payer: Self-pay

## 2022-12-04 ENCOUNTER — Emergency Department (HOSPITAL_COMMUNITY): Payer: Medicare HMO

## 2022-12-04 LAB — URINALYSIS, W/ REFLEX TO CULTURE (INFECTION SUSPECTED)
Bilirubin Urine: NEGATIVE
Glucose, UA: NEGATIVE mg/dL
Hgb urine dipstick: NEGATIVE
Ketones, ur: NEGATIVE mg/dL
Nitrite: NEGATIVE
Protein, ur: NEGATIVE mg/dL
Specific Gravity, Urine: 1.01 (ref 1.005–1.030)
pH: 5 (ref 5.0–8.0)

## 2022-12-04 LAB — D-DIMER, QUANTITATIVE: D-Dimer, Quant: 0.92 ug/mL-FEU — ABNORMAL HIGH (ref 0.00–0.50)

## 2022-12-04 LAB — CBG MONITORING, ED: Glucose-Capillary: 189 mg/dL — ABNORMAL HIGH (ref 70–99)

## 2022-12-04 LAB — LACTIC ACID, PLASMA
Lactic Acid, Venous: 1.7 mmol/L (ref 0.5–1.9)
Lactic Acid, Venous: 1.3 mmol/L (ref 0.5–1.9)

## 2022-12-04 LAB — TROPONIN I (HIGH SENSITIVITY)
Troponin I (High Sensitivity): 10 ng/L (ref <18)
Troponin I (High Sensitivity): 7 ng/L (ref <18)

## 2022-12-04 LAB — URINE CULTURE

## 2022-12-04 MED ORDER — IOHEXOL 350 MG/ML SOLN
75.0000 mL | Freq: Once | INTRAVENOUS | Status: AC | PRN
  Administered 2022-12-04: 75 mL via INTRAVENOUS

## 2022-12-04 MED ORDER — ONDANSETRON HCL 4 MG/2ML IJ SOLN
4.0000 mg | Freq: Once | INTRAMUSCULAR | Status: AC
  Administered 2022-12-04: 4 mg via INTRAVENOUS
  Filled 2022-12-04: qty 2

## 2022-12-04 MED ORDER — SODIUM CHLORIDE 0.9 % IV BOLUS
1000.0000 mL | Freq: Once | INTRAVENOUS | Status: AC
  Administered 2022-12-04: 1000 mL via INTRAVENOUS

## 2022-12-04 MED ORDER — MORPHINE SULFATE (PF) 4 MG/ML IV SOLN
4.0000 mg | Freq: Once | INTRAVENOUS | Status: AC
  Administered 2022-12-04: 4 mg via INTRAVENOUS
  Filled 2022-12-04: qty 1

## 2022-12-04 NOTE — Progress Notes (Signed)
    Durable Medical Equipment  (From admission, onward)           Start     Ordered   12/04/22 1210  For home use only DME standard manual wheelchair with seat cushion  Once       Comments: Patient suffers from ambulatory dysfunction which impairs their ability to perform daily activities like bathing, dressing, feeding, grooming, and toileting in the home.  A cane, crutch, or walker will not resolve issue with performing activities of daily living. A wheelchair will allow patient to safely perform daily activities. Patient can safely propel the wheelchair in the home or has a caregiver who can provide assistance. Length of need Lifetime. Accessories: elevating leg rests (ELRs), wheel locks, extensions and anti-tippers.   12/04/22 1210

## 2022-12-04 NOTE — Progress Notes (Signed)
Pt requested copy of healthcare POA.  Patient was given copy.  Chaplain available as needed.  Venida Jarvis, [place], [facility], Pager [phone]

## 2022-12-04 NOTE — ED Notes (Signed)
Patient reports chronic back and leg pain. When asked about her urine based off of triage note, she stated that she has had some frequent urination but no smell or burning while urinating.

## 2022-12-04 NOTE — ED Notes (Signed)
Ambulated patient to restroom with moderate assistance.

## 2022-12-04 NOTE — Discharge Planning (Addendum)
RNCM consulted for home health and DME wheelchair.  Pt has been active with [facility] in the past for [facility].  RNCM made referral to [facility] to start HHPT upon discharge.  RNCM made referral to Barbara Cower of [employer] to deliver wheelchair to pt home prior to discharge home today.     Durable Medical Equipment  (From admission, onward)           Start     Ordered   12/04/22 1210  For home use only DME standard manual wheelchair with seat cushion  Once       Comments: Patient suffers from ambulatory dysfunction which impairs their ability to perform daily activities like bathing, dressing, feeding, grooming, and toileting in the home.  A cane, crutch, or walker will not resolve issue with performing activities of daily living. A wheelchair will allow patient to safely perform daily activities. Patient can safely propel the wheelchair in the home or has a caregiver who can provide assistance. Length of need Lifetime. Accessories: elevating leg rests (ELRs), wheel locks, extensions and anti-tippers.   12/04/22 1210

## 2022-12-04 NOTE — Discharge Planning (Signed)
Katie Cohn, RN, BSN, [place] [phone] Pt qualifies for DME [facility]) bedside commode.  DME  ordered through [employer].  Barbara Cower of [employer] notified to deliver DME to home.

## 2022-12-04 NOTE — ED Notes (Signed)
Pt unable to void at this time. Yellow armband placed on pt's wrist.

## 2022-12-04 NOTE — ED Provider Notes (Signed)
[place] EMERGENCY DEPARTMENT AT [facility] Provider Note   CSN: [id number] Arrival date & time: 12/03/22  1740     History  Chief Complaint  Patient presents with   Back Pain   Knee Pain    Katie Owens is a 72 y.o. female.  Pt is a 72 yo female with pmhx significant for chronic back pain, htn, af (s/p ablation and not on thinners), sleep apnea, GERD, DM2, breast cancer, fibromyalgia, and IBS.  Pt came in last night with chief complaint of syncope.  She did not tell me any of that, just complained of her chronic back pain.  She did wait for over 13 hrs to be seen and was stable over night.  She has had a cough and urinary frequency.  No fevers.  More history from friends:  pt has been unable to walk and get out of bed for 2 weeks due to pain in her back.  She did have a MRI last week of her back that was ordered by her NS.  She does not have those results.  They also said she was put on prednisone to help with the pain and her sugars were over 400 last week.         Home Medications Prior to Admission medications   Medication Sig Start Date End Date Taking? Authorizing Provider  ALPRAZolam Prudy Feeler) 0.5 MG tablet Take 0.5 mg by mouth at bedtime. 08/06/14   [provider]  b complex vitamins tablet Take 1 tablet by mouth daily.    [provider]  beclomethasone (QVAR) 80 MCG/ACT inhaler Inhale 1 puff into the lungs 2 (two) times daily as needed (shortness of breath). Patient not taking: Reported on 11/27/2022    [provider]  Biotin 5 MG CAPS Take 5 mg by mouth daily.    [provider]  Blood Glucose Monitoring Suppl DEVI 1 each by Does not apply route in the morning, at noon, and at bedtime. May substitute to any manufacturer covered by patient's insurance. 11/27/22   Motwani, Carin Hock, MD  Brexpiprazole (REXULTI) 0.5 MG TABS Take 0.5 mg by mouth daily. Patient not taking: Reported on 11/27/2022    [provider]  Calcium  Citrate (CITRACAL PO) Take 2 tablets by mouth daily.    [provider]  carvedilol (COREG) 25 MG tablet Take 1 tablet (25 mg total) by mouth 2 (two) times daily. 09/06/17   Sheilah Pigeon, PA-C  chlorthalidone (HYGROTON) 25 MG tablet Take 25 mg by mouth daily.    [provider]  Cholecalciferol (VITAMIN D) 50 MCG (2000 UT) tablet Take 2,000 Units by mouth daily.     [provider]  cyclobenzaprine (FLEXERIL) 10 MG tablet Take 10 mg by mouth at bedtime as needed for muscle spasms.    [provider]  dicyclomine (BENTYL) 20 MG tablet Take 20 mg by mouth every 8 (eight) hours as needed for spasms.    [provider]  diltiazem (CARDIZEM) 30 MG tablet Take 30 mg by mouth every 4 (four) hours as needed (for HR>100 and BP>100).    [provider]  diphenhydrAMINE (BENADRYL) 25 MG tablet Take 25 mg by mouth every 6 (six) hours as needed for allergies.    [provider]  docusate sodium (COLACE) 100 MG capsule Take 1 capsule (100 mg total) by mouth 2 (two) times daily. Patient not taking: Reported on 11/27/2022 07/29/21   Tressie Stalker, MD  Dulaglutide (TRULICITY) 0.75  MG/0.5ML SOPN Inject 0.75 mg into the skin once a week. 11/27/22 12/27/22  Altamese [place], MD  DULoxetine (CYMBALTA) 60 MG capsule Take 60 mg by mouth daily. 02/09/16   [provider]  glipiZIDE (GLUCOTROL XL) 5 MG 24 hr tablet Take 1 tablet (5 mg total) by mouth daily with breakfast. 11/27/22   Altamese [place], MD  Glucose Blood (BLOOD GLUCOSE TEST STRIPS) STRP 1 each by In Vitro route in the morning, at noon, and at bedtime. May substitute to any manufacturer covered by patient's insurance. 11/27/22 04/09/23  Altamese [place], MD  Lancet Device MISC 1 each by Does not apply route in the morning, at noon, and at bedtime. May substitute to any manufacturer covered by patient's insurance. 11/27/22 12/27/22  Altamese [place], MD  Lancets Misc. MISC 1 each by Does not apply route  in the morning, at noon, and at bedtime. May substitute to any manufacturer covered by patient's insurance. 11/27/22 12/27/22  Altamese [place], MD  loperamide (IMODIUM) 2 MG capsule Take 2-4 mg by mouth as needed for diarrhea or loose stools.    [provider]  magnesium oxide (MAG-OX) 400 (240 Mg) MG tablet TAKE 1 TABLET BY MOUTH EVERY DAY 12/11/21   Allred, Fayrene Fearing, MD  Multiple Vitamins-Minerals (MULTIVITAMIN ADULT PO) Take 1 tablet by mouth daily.    [provider]  omeprazole (PRILOSEC) 20 MG capsule Take 20 mg by mouth daily.    [provider]  potassium chloride SA (K-DUR,KLOR-CON) 20 MEQ tablet Take 20 mEq by mouth daily.     [provider]  pregabalin (LYRICA) 75 MG capsule Take 75 mg by mouth 2 (two) times daily.    [provider]  rosuvastatin (CRESTOR) 10 MG tablet Take 10 mg by mouth daily after supper.    [provider]  SUMAtriptan (IMITREX) 100 MG tablet Take 100 mg by mouth as needed for migraine. 04/01/20   [provider]  telmisartan (MICARDIS) 40 MG tablet Take 20 mg by mouth daily. 04/01/20   [provider]  traMADol (ULTRAM) 50 MG tablet Take 1-2 tablets (50-100 mg total) by mouth every 6 (six) hours as needed for moderate pain or severe pain. 07/29/21   Tressie Stalker, MD  VENTOLIN HFA 108 (90 BASE) MCG/ACT inhaler Inhale 1-2 puffs into the lungs every 6 (six) hours as needed for wheezing or shortness of breath.  12/24/14   [provider]      Allergies    Penicillins, Aspirin, Ibuprofen, and Naproxen    Review of Systems   Review of Systems  Musculoskeletal:  Positive for back pain.  Neurological:  Positive for syncope.  All other systems reviewed and are negative.   Physical Exam Updated Vital Signs BP 102/75   Pulse (!) 106   Temp 98.5 F (36.9 C) (Oral)   Resp 19   Ht 4\' 11"  (1.499 m)   Wt 100.7 kg   SpO2 92%   BMI 44.84 kg/m  Physical Exam Vitals and nursing note  reviewed.  Constitutional:      Appearance: Normal appearance. She is obese.  HENT:     Head: Normocephalic and atraumatic.     Right Ear: External ear normal.     Left Ear: External ear normal.     Nose: Nose normal.     Mouth/Throat:     Mouth: Mucous membranes are moist.     Pharynx: Oropharynx is clear.  Eyes:     Extraocular Movements: Extraocular movements intact.  Conjunctiva/sclera: Conjunctivae normal.     Pupils: Pupils are equal, round, and reactive to light.  Cardiovascular:     Rate and Rhythm: Normal rate and regular rhythm.     Pulses: Normal pulses.     Heart sounds: Normal heart sounds.  Pulmonary:     Effort: Pulmonary effort is normal.     Breath sounds: Normal breath sounds.  Abdominal:     General: Abdomen is flat. Bowel sounds are normal.     Palpations: Abdomen is soft.  Musculoskeletal:        General: Normal range of motion.     Cervical back: Normal range of motion and neck supple.  Skin:    General: Skin is warm.     Capillary Refill: Capillary refill takes less than 2 seconds.  Neurological:     General: No focal deficit present.     Mental Status: She is alert and oriented to person, place, and time.  Psychiatric:        Mood and Affect: Mood normal.        Behavior: Behavior normal.     ED Results / Procedures / Treatments   Labs (all labs ordered are listed, but only abnormal results are displayed) Labs Reviewed  URINALYSIS, W/ REFLEX TO CULTURE (INFECTION SUSPECTED) - Abnormal; Notable for the following components:      Result Value   Leukocytes,Ua SMALL (*)    Bacteria, UA RARE (*)    All other components within normal limits  COMPREHENSIVE METABOLIC PANEL - Abnormal; Notable for the following components:   Sodium 133 (*)    Glucose, Bld 243 (*)    Creatinine, Ser 1.12 (*)    GFR, Estimated 52 (*)    All other components within normal limits  D-DIMER, QUANTITATIVE - Abnormal; Notable for the following components:   D-Dimer,  Quant 0.92 (*)    All other components within normal limits  CBG MONITORING, ED - Abnormal; Notable for the following components:   Glucose-Capillary 189 (*)    All other components within normal limits  URINE CULTURE  CBC WITH DIFFERENTIAL/PLATELET  LACTIC ACID, PLASMA  LACTIC ACID, PLASMA  TROPONIN I (HIGH SENSITIVITY)  TROPONIN I (HIGH SENSITIVITY)    EKG EKG Interpretation  Date/Time:  Tuesday December 04 2022 07:49:40 EDT Ventricular Rate:  97 PR Interval:  150 QRS Duration: 93 QT Interval:  377 QTC Calculation: 479 R Axis:   10 Text Interpretation: Sinus rhythm Nonspecific T abnormalities, lateral leads No significant change since last tracing Confirmed by Jacalyn Lefevre [id number]) on 12/04/2022 7:58:21 AM  Radiology CT Angio Chest PE W and/or Wo Contrast  Result Date: 12/04/2022 CLINICAL DATA:  Cough and low back pain for a month EXAM: CT ANGIOGRAPHY CHEST WITH CONTRAST TECHNIQUE: Multidetector CT imaging of the chest was performed using the standard protocol during bolus administration of intravenous contrast. Multiplanar CT image reconstructions and MIPs were obtained to evaluate the vascular anatomy. RADIATION DOSE REDUCTION: This exam was performed according to the departmental dose-optimization program which includes automated exposure control, adjustment of the mA and/or kV according to patient size and/or use of iterative reconstruction technique. CONTRAST:  75mL OMNIPAQUE IOHEXOL 350 MG/ML SOLN COMPARISON:  X-ray 12/03/2022 and older FINDINGS: Cardiovascular: Heart is nonenlarged. No pericardial effusion. The thoracic aorta has some minimal atherosclerotic calcified plaque. Normal course and caliber of the thoracic aorta. Significant motion seen throughout the examination. In addition there is heterogeneous contrast bolus of the peripheral vessels. This limits evaluation for pulmonary emboli,  nondiagnostic for small and peripheral emboli. No large or central embolus identified.  Mediastinum/Nodes: No specific abnormal lymph node enlargement identified in the axillary regions, hilum or mediastinum. Normal caliber thoracic esophagus. Preserved thyroid gland. Lungs/Pleura: Breathing motion seen throughout the examination. No pneumothorax, effusion or edema. There is some dependent atelectasis identified. Upper Abdomen: Slightly nodular contours of the liver, nonspecific. Please correlate for any liver dysfunction. Adrenal glands are preserved. Musculoskeletal: Moderate diffuse degenerative changes along the spine and shoulders. Fixation hardware seen of the cervical spine at the edge of the imaging field with streak artifact. Asymmetric left-sided breast tissue identified compared to right with surgical clips in the left axillary region. Review of the MIP images confirms the above findings. IMPRESSION: Significant breathing motion and limited contrast bolus. No large or central embolus. Slightly nodular contours of the liver. Please correlate for any liver dysfunction. Asymmetric left-sided breast tissue compared to right. Please correlate for any known history and correlation to mammography when clinically appropriate. Aortic Atherosclerosis (ICD10-I70.0). Electronically Signed   By: Karen Kays M.D.   On: 12/04/2022 10:36   DG Chest 2 View  Result Date: 12/03/2022 CLINICAL DATA:  Cough.  Low back pain for 1 month. EXAM: CHEST - 2 VIEW COMPARISON:  01/23/2022 FINDINGS: Loop recorder is present. Surgical clips in the left breast. Postoperative changes in the cervical spine. Heart size and pulmonary vascularity are normal. Lungs are clear. No pleural effusions. No pneumothorax. Mediastinal contours appear intact. Degenerative changes in the spine and shoulders. IMPRESSION: No active cardiopulmonary disease. Electronically Signed   By: Burman Nieves M.D.   On: 12/03/2022 19:37    Procedures Procedures    Medications Ordered in ED Medications  sodium chloride 0.9 % bolus 1,000 mL  (0 mLs Intravenous Stopped 12/04/22 0902)  ondansetron (ZOFRAN) injection 4 mg (4 mg Intravenous Given 12/04/22 0740)  morphine (PF) 4 MG/ML injection 4 mg (4 mg Intravenous Given 12/04/22 0740)  iohexol (OMNIPAQUE) 350 MG/ML injection 75 mL (75 mLs Intravenous Contrast Given 12/04/22 0954)    ED Course/ Medical Decision Making/ A&P                             Medical Decision Making Amount and/or Complexity of Data Reviewed Labs: ordered. Radiology: ordered.  Risk Prescription drug management.   This patient presents to the ED for concern of syncope, back pain, this involves an extensive number of treatment options, and is a complaint that carries with it a high risk of complications and morbidity.  The differential diagnosis includes orthostatic, cardiogenic, vasovagal   Co morbidities that complicate the patient evaluation  chronic back pain, htn, af (s/p ablation and not on thinners), sleep apnea, GERD, DM2, breast cancer, fibromyalgia, and IBS   Additional history obtained:  Additional history obtained from epic chart review  Lab Tests:  I Ordered, and personally interpreted labs.  The pertinent results include:  cbc nl, cmp nl other than glucose elevated at 243 and cr 1.12 (cr 1.29 last year); trop nl; lactic nl; ua neg    Imaging Studies ordered:  I ordered imaging studies including cxr and ct chest/abd/pelvis I independently visualized and interpreted imaging which showed CXR: No active cardiopulmonary disease.  CT chest: Significant breathing motion and limited contrast bolus. No large or  central embolus.    Slightly nodular contours of the liver. Please correlate for any  liver dysfunction.    Asymmetric left-sided breast tissue compared to right. Please  correlate for any known history and correlation to mammography when  clinically appropriate.    Aortic Atherosclerosis (ICD10-I70.0).  CT abd/pelvis: Slightly nodular contours of the liver but no ascites,  splenomegaly. Patent portal vein. Please correlate for any liver dysfunction.  Scattered colonic stool. Normal appendix.  Exophytic uterine fibroid.  I agree with the radiologist interpretation   Cardiac Monitoring:  The patient was maintained on a cardiac monitor.  I personally viewed and interpreted the cardiac monitored which showed an underlying rhythm of: nsr   Medicines ordered and prescription drug management:  I ordered medication including ivfs/morphine/zofran  for sx  Reevaluation of the patient after these medicines showed that the patient improved I have reviewed the patients home medicines and have made adjustments as needed   Test Considered:  ct   Critical Interventions:  Pain control   Consultations Obtained:  I requested consultation with TOC,  and discussed lab and imaging findings as well as pertinent plan - they will set up home health.   Problem List / ED Course:  Chronic low back pain:  likely related to opiate tolerance.  She is now able to ambulate with assistance after IV morphine.  She does request home health, so TOC consulted.  She will need to f/u with her pain management physician.  Pt is confined to one floor at her home. ?syncopal events:  likely related to dehydration from hyperglycemia.  She looks much better after fluids.   Breast size abn:  pt does have a hx of breast cancer and surgery.  Pt reports last mammogram was about 3 months ago and nl. ? Liver abn on ct:  liver bx done in 2022 which showed fatty liver.  Liver labs nl.   Reevaluation:  After the interventions noted above, I reevaluated the patient and found that they have :improved   Social Determinants of Health:  Lives at home   Dispostion:  After consideration of the diagnostic results and the patients response to treatment, I feel that the patent would benefit from discharge with outpatient f/u.          Final Clinical Impression(s) / ED Diagnoses Final  diagnoses:  Chronic bilateral low back pain with bilateral sciatica  Dehydration  Ambulatory dysfunction    Rx / DC Orders ED Discharge Orders     None         Jacalyn Lefevre, MD 12/04/22 [phone]

## 2022-12-06 LAB — URINE CULTURE: Culture: >=100000 — AB

## 2022-12-07 ENCOUNTER — Telehealth (HOSPITAL_BASED_OUTPATIENT_CLINIC_OR_DEPARTMENT_OTHER): Payer: Self-pay | Admitting: *Deleted

## 2022-12-07 NOTE — Telephone Encounter (Signed)
Post ED Visit - Positive Culture Follow-up: Unsuccessful Patient Follow-up  Culture assessed and recommendations reviewed by:  []  Andreas Ohm,  Pharm.D. []  Celedonio Miyamoto, Pharm.D., BCPS AQ-ID []  Garvin Fila, Pharm.D., BCPS []  Georgina Pillion, [address].D., BCPS []  [place], [place].D., BCPS, AAHIVP []  Estella Husk, Pharm.D., BCPS, AAHIVP []  Sherlynn Carbon, PharmD []  Pollyann Samples, PharmD, BCPS  Positive urine culture  [x]  Patient discharged without antimicrobial prescription and treatment is now indicated []  Organism is resistant to prescribed ED discharge antimicrobial []  Patient with positive blood cultures  Symptom check.  It (+)symptoms start Keflex 500mg  PO BID x 5 days, Lurena Nida, PA  Unable to contact patient after 3 attempts, letter will be sent to address on file  Lysle Pearl 12/07/2022, 8:54 AM

## 2022-12-18 ENCOUNTER — Other Ambulatory Visit: Payer: Self-pay | Admitting: Neurological Surgery

## 2023-01-03 ENCOUNTER — Other Ambulatory Visit: Payer: Self-pay | Admitting: "Endocrinology

## 2023-01-05 ENCOUNTER — Other Ambulatory Visit: Payer: Self-pay | Admitting: "Endocrinology

## 2023-01-07 ENCOUNTER — Telehealth: Payer: Self-pay

## 2023-01-07 ENCOUNTER — Telehealth: Payer: Self-pay | Admitting: Pulmonary Disease

## 2023-01-07 ENCOUNTER — Other Ambulatory Visit: Payer: Self-pay

## 2023-01-07 DIAGNOSIS — E1165 Type 2 diabetes mellitus with hyperglycemia: Secondary | ICD-10-CM

## 2023-01-07 MED ORDER — TRULICITY 1.5 MG/0.5ML ~~LOC~~ SOAJ
1.5000 mg | SUBCUTANEOUS | 0 refills | Status: DC
Start: 2023-01-07 — End: 2023-02-07

## 2023-01-07 NOTE — Progress Notes (Signed)
Surgical Instructions    Your procedure is scheduled on Monday January 14, 2023.  Report to [facility] Main Entrance "A" at 10:15 A.M., then check in with the Admitting office.  Call this number if you have problems the morning of surgery:  [phone]   If you have any questions prior to your surgery date call [phone]: Open Monday-Friday 8am-4pm If you experience any cold or flu symptoms such as cough, fever, chills, shortness of breath, etc. between now and your scheduled surgery, please notify us at the above number     Remember:  Do not eat after midnight the night before your surgery.  You may drink clear liquids until 9:15 the morning of your surgery.   Clear liquids allowed are: Water, Non-Citrus Juices (without pulp), Carbonated Beverages, Clear Tea, Black Coffee ONLY (NO MILK, CREAM OR POWDERED CREAMER of any kind), and Gatorade.    Take these medicines the morning of surgery with A SIP OF WATER:  ARIPiprazole (ABILIFY)  carvedilol (COREG)  celecoxib (CELEBREX)  DULoxetine (CYMBALTA)  omeprazole (PRILOSEC)  oxyCODONE (OXY IR/ROXICODONE)   If needed:  beclomethasone (QVAR)  dicyclomine (BENTYL)  diphenhydrAMINE (BENADRYL)  SUMAtriptan (IMITREX)  VENTOLIN Inhaler: please bring the day of surgery.  As of today, STOP taking any Aspirin (unless otherwise instructed by your surgeon) Aleve, Naproxen, Ibuprofen, Motrin, Advil, Goody's, BC's, all herbal medications, fish oil, and all vitamins.  Stop Dulaglutide (TRULICITY) 7 days prior to surgery. Last dose on 01/02/2023.  WHAT DO I DO ABOUT MY DIABETES MEDICATION?   Do not take oral diabetes medicines (pills) the morning of surgery. This includes glipiZIDE (GLUCOTROL XL).   The day of surgery, do not take other diabetes injectables, including Byetta (exenatide), Bydureon (exenatide ER), Victoza (liraglutide), or Trulicity (dulaglutide).  If your CBG is greater than 220 mg/dL, you may take  of your sliding scale  (correction) dose of insulin.   HOW TO MANAGE YOUR DIABETES BEFORE AND AFTER SURGERY  Why is it important to control my blood sugar before and after surgery? Improving blood sugar levels before and after surgery helps healing and can limit problems. A way of improving blood sugar control is eating a healthy diet by:  Eating less sugar and carbohydrates  Increasing activity/exercise  Talking with your doctor about reaching your blood sugar goals High blood sugars (greater than 180 mg/dL) can raise your risk of infections and slow your recovery, so you will need to focus on controlling your diabetes during the weeks before surgery. Make sure that the doctor who takes care of your diabetes knows about your planned surgery including the date and location.  How do I manage my blood sugar before surgery? Check your blood sugar at least 4 times a day, starting 2 days before surgery, to make sure that the level is not too high or low.  Check your blood sugar the morning of your surgery when you wake up and every 2 hours until you get to the Short Stay unit.  If your blood sugar is less than 70 mg/dL, you will need to treat for low blood sugar: Do not take insulin. Treat a low blood sugar (less than 70 mg/dL) with  cup of clear juice (cranberry or apple), 4 glucose tablets, OR glucose gel. Recheck blood sugar in 15 minutes after treatment (to make sure it is greater than 70 mg/dL). If your blood sugar is not greater than 70 mg/dL on recheck, call [phone] for further instructions. Report your blood sugar to the short  stay nurse when you get to Short Stay.  If you are admitted to the hospital after surgery: Your blood sugar will be checked by the staff and you will probably be given insulin after surgery (instead of oral diabetes medicines) to make sure you have good blood sugar levels. The goal for blood sugar control after surgery is 80-180 mg/dL.   Special instructions:    Oral Hygiene  is also important to reduce your risk of infection.  Remember - BRUSH YOUR TEETH THE MORNING OF SURGERY WITH YOUR REGULAR TOOTHPASTE.     Pre-operative 5 CHG Bath Instructions   You can play a key role in reducing the risk of infection after surgery. Your skin needs to be as free of germs as possible. You can reduce the number of germs on your skin by washing with CHG (chlorhexidine gluconate) soap before surgery. CHG is an antiseptic soap that kills germs and continues to kill germs even after washing.   DO NOT use if you have an allergy to chlorhexidine/CHG or antibacterial soaps. If your skin becomes reddened or irritated, stop using the CHG and notify one of our RNs at [phone].   Please shower with the CHG soap starting 4 days before surgery using the following schedule:     Please keep in mind the following:  DO NOT shave, including legs and underarms, starting the day of your first shower.   You may shave your face at any point before/day of surgery.  Place clean sheets on your bed the day you start using CHG soap. Use a clean washcloth (not used since being washed) for each shower. DO NOT sleep with pets once you start using the CHG.   CHG Shower Instructions:  If you choose to wash your hair and private area, wash first with your normal shampoo/soap.  After you use shampoo/soap, rinse your hair and body thoroughly to remove shampoo/soap residue.  Turn the water OFF and apply about 3 tablespoons (45 ml) of CHG soap to a CLEAN washcloth.  Apply CHG soap ONLY FROM YOUR NECK DOWN TO YOUR TOES (washing for 3-5 minutes)  DO NOT use CHG soap on face, private areas, open wounds, or sores.  Pay special attention to the area where your surgery is being performed.  If you are having back surgery, having someone wash your back for you may be helpful. Wait 2 minutes after CHG soap is applied, then you may rinse off the CHG soap.  Pat dry with a clean towel  Put on clean clothes/pajamas    If you choose to wear lotion, please use ONLY the CHG-compatible lotions on the back of this paper.     Additional instructions for the day of surgery: DO NOT APPLY any lotions, deodorants, cologne, or perfumes.   Put on clean/comfortable clothes.  Brush your teeth.  Ask your nurse before applying any prescription medications to the skin.      CHG Compatible Lotions   Aveeno Moisturizing lotion  Cetaphil Moisturizing Cream  Cetaphil Moisturizing Lotion  Clairol Herbal Essence Moisturizing Lotion, Dry Skin  Clairol Herbal Essence Moisturizing Lotion, Extra Dry Skin  Clairol Herbal Essence Moisturizing Lotion, Normal Skin  Curel Age Defying Therapeutic Moisturizing Lotion with Alpha Hydroxy  Curel Extreme Care Body Lotion  Curel Soothing Hands Moisturizing Hand Lotion  Curel Therapeutic Moisturizing Cream, Fragrance-Free  Curel Therapeutic Moisturizing Lotion, Fragrance-Free  Curel Therapeutic Moisturizing Lotion, Original Formula  Eucerin Daily Replenishing Lotion  Eucerin Dry Skin Therapy Plus Alpha Hydroxy Crme  Eucerin Dry Skin Therapy Plus Alpha Hydroxy Lotion  Eucerin Original Crme  Eucerin Original Lotion  Eucerin Plus Crme Eucerin Plus Lotion  Eucerin TriLipid Replenishing Lotion  Keri Anti-Bacterial Hand Lotion  Keri Deep Conditioning Original Lotion Dry Skin Formula Softly Scented  Keri Deep Conditioning Original Lotion, Fragrance Free Sensitive Skin Formula  Keri Lotion Fast Absorbing Fragrance Free Sensitive Skin Formula  Keri Lotion Fast Absorbing Softly Scented Dry Skin Formula  Keri Original Lotion  Keri Skin Renewal Lotion Keri Silky Smooth Lotion  Keri Silky Smooth Sensitive Skin Lotion  Nivea Body Creamy Conditioning Oil  Nivea Body Extra Enriched Lotion  Nivea Body Original Lotion  Nivea Body Sheer Moisturizing Lotion Nivea Crme  Nivea Skin Firming Lotion  NutraDerm 30 Skin Lotion  NutraDerm Skin Lotion  NutraDerm Therapeutic Skin Cream   NutraDerm Therapeutic Skin Lotion  ProShield Protective Hand Cream  Provon moisturizing lotion    Day of Surgery:  Take a shower with CHG soap. Wear Clean/Comfortable clothing the morning of surgery Do not apply any deodorants/lotions.   Remember to brush your teeth WITH YOUR REGULAR TOOTHPASTE.  [facility] is not responsible for any belongings or valuables.    Do NOT Smoke (Tobacco/Vaping)  24 hours prior to your procedure  If you use a CPAP at night, you may bring your mask for your overnight stay.   Contacts, glasses, hearing aids, dentures or partials may not be worn into surgery, please bring cases for these belongings   For patients admitted to the hospital, discharge time will be determined by your treatment team.   Patients discharged the day of surgery will not be allowed to drive home, and someone needs to stay with them for 24 hours.   SURGICAL WAITING ROOM VISITATION Patients having surgery or a procedure may have no more than 2 support people in the waiting area - these visitors may rotate.   Children under the age of 58 must have an adult with them who is not the patient. If the patient needs to stay at the hospital during part of their recovery, the visitor guidelines for inpatient rooms apply. Pre-op nurse will coordinate an appropriate time for 1 support person to accompany patient in pre-op.  This support person may not rotate.   Please refer to [email or web address] for the visitor guidelines for Inpatients (after your surgery is over and you are in a regular room).   If you received a COVID test during your pre-op visit, it is requested that you wear a mask when out in public, stay away from anyone that may not be feeling well, and notify your surgeon if you develop symptoms. If you have been in contact with anyone that has tested positive in the last 10 days, please notify your surgeon.    Please read over the  following fact sheets that you were given.

## 2023-01-07 NOTE — Telephone Encounter (Signed)
How many refills do you want to give her on this new dose?

## 2023-01-07 NOTE — Telephone Encounter (Signed)
Katie Owens states she is ready to go up to the next dose of Trulicity and this can be sent to [facility] on [address]

## 2023-01-07 NOTE — Telephone Encounter (Signed)
LVM for a return call.

## 2023-01-08 ENCOUNTER — Other Ambulatory Visit: Payer: Self-pay

## 2023-01-08 ENCOUNTER — Encounter (HOSPITAL_COMMUNITY): Payer: Self-pay

## 2023-01-08 ENCOUNTER — Encounter (HOSPITAL_COMMUNITY)
Admission: RE | Admit: 2023-01-08 | Discharge: 2023-01-08 | Disposition: A | Discharge status: Home / Self Care | Payer: Medicare HMO | Source: Ambulatory Visit | Attending: Neurological Surgery | Admitting: Neurological Surgery

## 2023-01-08 VITALS — BP 117/48 | HR 92 | Temp 98.8°F | Resp 17 | Ht 59.0 in | Wt 210.8 lb

## 2023-01-08 DIAGNOSIS — M797 Fibromyalgia: Secondary | ICD-10-CM | POA: Diagnosis not present

## 2023-01-08 DIAGNOSIS — E119 Type 2 diabetes mellitus without complications: Secondary | ICD-10-CM | POA: Diagnosis not present

## 2023-01-08 DIAGNOSIS — I493 Ventricular premature depolarization: Secondary | ICD-10-CM | POA: Diagnosis not present

## 2023-01-08 DIAGNOSIS — Z9049 Acquired absence of other specified parts of digestive tract: Secondary | ICD-10-CM | POA: Diagnosis not present

## 2023-01-08 DIAGNOSIS — M4316 Spondylolisthesis, lumbar region: Secondary | ICD-10-CM | POA: Diagnosis not present

## 2023-01-08 DIAGNOSIS — Z9889 Other specified postprocedural states: Secondary | ICD-10-CM | POA: Diagnosis not present

## 2023-01-08 DIAGNOSIS — J45909 Unspecified asthma, uncomplicated: Secondary | ICD-10-CM | POA: Diagnosis not present

## 2023-01-08 DIAGNOSIS — K589 Irritable bowel syndrome without diarrhea: Secondary | ICD-10-CM | POA: Insufficient documentation

## 2023-01-08 DIAGNOSIS — K219 Gastro-esophageal reflux disease without esophagitis: Secondary | ICD-10-CM | POA: Insufficient documentation

## 2023-01-08 DIAGNOSIS — Z01818 Encounter for other preprocedural examination: Secondary | ICD-10-CM

## 2023-01-08 DIAGNOSIS — E785 Hyperlipidemia, unspecified: Secondary | ICD-10-CM | POA: Diagnosis not present

## 2023-01-08 DIAGNOSIS — K760 Fatty (change of) liver, not elsewhere classified: Secondary | ICD-10-CM | POA: Diagnosis not present

## 2023-01-08 DIAGNOSIS — R002 Palpitations: Secondary | ICD-10-CM | POA: Diagnosis not present

## 2023-01-08 DIAGNOSIS — E1122 Type 2 diabetes mellitus with diabetic chronic kidney disease: Secondary | ICD-10-CM | POA: Diagnosis not present

## 2023-01-08 DIAGNOSIS — Z01812 Encounter for preprocedural laboratory examination: Secondary | ICD-10-CM | POA: Diagnosis not present

## 2023-01-08 DIAGNOSIS — I129 Hypertensive chronic kidney disease with stage 1 through stage 4 chronic kidney disease, or unspecified chronic kidney disease: Secondary | ICD-10-CM | POA: Insufficient documentation

## 2023-01-08 LAB — COMPREHENSIVE METABOLIC PANEL
ALT: 45 U/L — ABNORMAL HIGH (ref 0–44)
AST: 38 U/L (ref 15–41)
Albumin: 3.8 g/dL (ref 3.5–5.0)
Alkaline Phosphatase: 151 U/L — ABNORMAL HIGH (ref 38–126)
Anion gap: 13 (ref 5–15)
BUN: 26 mg/dL — ABNORMAL HIGH (ref 8–23)
CO2: 25 mmol/L (ref 22–32)
Calcium: 9.8 mg/dL (ref 8.9–10.3)
Chloride: 98 mmol/L (ref 98–111)
Creatinine, Ser: 1.3 mg/dL — ABNORMAL HIGH (ref 0.44–1.00)
GFR, Estimated: 44 mL/min — ABNORMAL LOW (ref >60)
Glucose, Bld: 115 mg/dL — ABNORMAL HIGH (ref 70–99)
Potassium: 4.1 mmol/L (ref 3.5–5.1)
Sodium: 136 mmol/L (ref 135–145)
Total Bilirubin: 0.5 mg/dL (ref 0.3–1.2)
Total Protein: 7.1 g/dL (ref 6.5–8.1)

## 2023-01-08 LAB — HEMOGLOBIN A1C
Hgb A1c MFr Bld: 7.6 % — ABNORMAL HIGH (ref 4.8–5.6)
Mean Plasma Glucose: 171 mg/dL

## 2023-01-08 LAB — CBC
HCT: 40.8 % (ref 36.0–46.0)
Hemoglobin: 12.7 g/dL (ref 12.0–15.0)
MCH: 28.2 pg (ref 26.0–34.0)
MCHC: 31.1 g/dL (ref 30.0–36.0)
MCV: 90.7 fL (ref 80.0–100.0)
Platelets: 250 10*3/uL (ref 150–400)
RBC: 4.5 MIL/uL (ref 3.87–5.11)
RDW: 13.1 % (ref 11.5–15.5)
WBC: 9.2 10*3/uL (ref 4.0–10.5)
nRBC: 0 % (ref 0.0–0.2)

## 2023-01-08 LAB — TYPE AND SCREEN
ABO/RH(D): A POS
Antibody Screen: NEGATIVE

## 2023-01-08 LAB — SURGICAL PCR SCREEN
MRSA, PCR: NEGATIVE
Staphylococcus aureus: NEGATIVE

## 2023-01-08 LAB — GLUCOSE, CAPILLARY: Glucose-Capillary: 111 mg/dL — ABNORMAL HIGH (ref 70–99)

## 2023-01-08 NOTE — Progress Notes (Signed)
PAT Anesthesia APP Evaluation:  Case: [id number] Date/Time: 01/14/23 1200   Procedure: Posterior, Bilateral decompression, Fusion L2 to prev fusion L3-5 (Bilateral: Back) - RM 21 to follow 3C   Anesthesia type: General   Pre-op diagnosis: Spondylolisthesis, Lumbar region   Location: [facility] ROOM 21 / [facility]   Surgeons: Barnett Abu, MD       DISCUSSION: Patient is a 72 year old female scheduled for the above procedure.  History includes former smoker (quit 10/08/79), post-operative N/V, HTN, HLD, afib (s/p ablation in 2012 @ [facility] by Dr. Smith Robert; loop recorder implant 03/08/16, RRT since 10/2019, patient preferred to leave device in 09/06/20), dyspnea, asthma, OSA (moderate OSA w/ AHI 19/hr 07/30/20, does not currently have CPAP machine), DM2, CKD (stage III), fatty liver, GERD, fibromyalgia, breast cancer (left, s/p lumpectomy with LN dissection x 12, chemoradiation 1995), IBS, cholecystectomy (12/15/20 with liver biopsy [mildly active steatohepatitis grade 1 of 3, mild fibrosis stage 1-2 or 4] and abdominal wall lipoma excision), spinal surgery (C3-7 ACDF ~ 2016; L4-5 TLIF 11/08/09; L3-4 PLIF 07/27/21). BMI is consistent with obesity.    She was followed by EP cardiologist Hillis Range, MD for afib s/p ablation 2012, palpitations with occasional PVCs. Last EP visit 09/06/20 with Francis Dowse, PA-C for follow-up and preoperative evaluation for cholecystectomy which she had in June 2022. Echo 10/03/20 showed LVEF 50-55%, no regional wall motion abnormalities, normal RV systolic function, mild RV wall thickness, trivial MR. She had afib ablation in 2012. No known definite recurrence since then, although likely brief atrial tachycardia in late 2017. ILD placed in 2018. She opted to stop Xarelto after ILD showed no afib after 3 months. Loop recorder at end of life since 10/2019, but patient opted to leave the device implanted.   ED visit 12/04/22 for back pain. Also reported syncopal event. She had been unable to walk and  get out of bed for about 2 weeks due to back pain. She was awaiting L-spine MRI results and was on prednisone at the time. CBGs > 400 with urinary frequency. Some coughing without fevers then.   Chronic low back pain:  likely related to opiate tolerance.  She is now able to ambulate with assistance after IV morphine.  She does request home health, so TOC consulted.  She will need to f/u with her pain management physician.  Pt is confined to one floor at her home. ?syncopal events:  likely related to dehydration from hyperglycemia.  She looks much better after fluids.   Breast size abn:  pt does have a hx of breast cancer and surgery.  Pt reports last mammogram was about 3 months ago and nl. ? Liver abn on ct:  liver bx done in 2022 which showed fatty liver.  Liver labs nl.      Dr. Roosevelt Locks 11/27/22: TSH 4.74 and A1c 10.0% 11/22/22 (scanned under Media tab)  Diabetes complicated by neuropathy, nephropathy  Hba1c goal less than 7.0, current Hba1c is 9.1. Will recommend for the following change of medications to: Continue metformin XR 500 mg twice daily Start Trulicity 0.75 mg weekly Start glipizide XL 5 mg daily, hold if blood sugar less than 150   Patient previously tolerated Ozempic well, unable to afford now   No known contraindications to any of above medications No history of MEN syndrome/medullary thyroid cancer/pancreatitis or pancreatic cancer in self or family   She reported a history of recurrent presyncope over the years.  She has been able to manage by recognizing a "sparkling"  aura and would sit down. Until recently, last episode was about 5-6 years ago. About six weeks ago she had two episode of syncope.  Both instances they occurred shortly after standing.  The first she had been in the bathroom sitting on a shower chair.  The second time, she stood up from a chair to get to her kitchen bar. Episode lasted about 30 seconds. Her roommate witness and said their was no associated  seizure type activity. She did not associate any cardiac symptoms with the episodes. She did not that she has had lower BP readings lately with DBP into the 40's.   No orthopnea.  More recently she tends to sleep more upright as she will have significant back pain when she sleeps on her back or side.   Trulicity: A1c  In June she was on steroids for a rash and had CBGs over 400.  She is now off steroids and has been started on Trulicity.  She also reports some dietary changes.  Reports fasting CBGs are around 100 with highest CBGs in the evening which are around 200.    She lives on a second floor condo. She can go up a flight of stairs without chest pain. She has chronic DOE which she feels is stable. She is currently using oxycodone, a muscle relaxant, and Tylenol for pain control and as able to move around better than when she was seen in the ED in June.   Episodes of syncope sound like they could be related to orthostatic hypotension.  She is on 3 antihypertensives.  She does monitor her blood pressure regularly and was encouraged to contact her primary care to consider adjustments.  Reviewed with anesthesiologist Eilene Ghazi, MD.  She denied chest pain or significant palpitations.  Recent EKG showed normal sinus rhythm.  She reported a recent primary care evaluation, will request records.   VS: BP (!) 117/48   Pulse 92   Temp 37.1 C   Resp 17   Ht 4\' 11"  (1.499 m)   Wt 95.6 kg   SpO2 96%   BMI 42.58 kg/m    PROVIDERS: Mosetta Putt, MD is PCP [phone] phone; fax [phone]) Cyril Mourning, MD is pulmonologist. Last visit 03/28/21 for OSA follow-up. Hassell Done, MD is endocrinologist Hillis Range, MD is EP cardiologist  Altamese Genesee, MD is endocrinologist. Last visit 11/27/22.     LABS: {CHL AN LABS REVIEWED:112001::"Labs reviewed: Acceptable for surgery."} (all labs ordered are listed, but only abnormal results are displayed)  Labs Reviewed  GLUCOSE,  CAPILLARY - Abnormal; Notable for the following components:      Result Value   Glucose-Capillary 111 (*)    All other components within normal limits  TSH 4.74 and A1c 10.0% 11/22/22 (scanned under Media tab)   IMAGES: CT Abd/pelvis 12/04/22: IMPRESSION: - Slightly nodular contours of the liver but no ascites, splenomegaly. Patent portal vein. Please correlate for any liver dysfunction. - Scattered colonic stool.  Normal appendix. - Exophytic uterine fibroid.  CTA Chest 12/04/22: IMPRESSION: - Significant breathing motion and limited contrast bolus. No large or central embolus. - Slightly nodular contours of the liver. Please correlate for any liver dysfunction. - Asymmetric left-sided breast tissue compared to right. Please correlate for any known history and correlation to mammography when clinically appropriate. - Aortic Atherosclerosis (ICD10-I70.0).  CXR 12/03/22: FINDINGS: Loop recorder is present. Surgical clips in the left breast. Postoperative changes in the cervical spine. Heart size and pulmonary vascularity are normal. Lungs are clear. No  pleural effusions. No pneumothorax. Mediastinal contours appear intact. Degenerative changes in the spine and shoulders. IMPRESSION: No active cardiopulmonary disease.  MRI L-spine 11/29/22 ([facility]). IMPRESSION:  1. Previous anterior posterior fusion L3-L4 and L4-L5. L3-4 fusion has occurred since the previous MRI.  2. Severe degenerative disc disease at L2-3 with large disc herniation extending superior labrum behind L2 causing severe spinal stenosis. This is new compared with the previous study.  3. Severe degenerative disc disease at T9-T10, T10-T11 and T11-T12, unchanged    EKG: 12/05/22: Sinus rhythm Nonspecific T abnormalities, lateral leads No significant change since last tracing Confirmed by Jacalyn Lefevre [id number]) on 12/04/2022 7:58:21 AM   CV: Echo 10/03/20: IMPRESSIONS   1. Left ventricular ejection fraction, by  estimation, is 50 to 55%. The  left ventricle has low normal function. The left ventricle has no regional  wall motion abnormalities. Left ventricular diastolic parameters are  indeterminate.   2. Right ventricular systolic function is normal. The right ventricular  size is normal. Mildly increased right ventricular wall thickness.   3. The mitral valve is normal in structure. Trivial mitral valve  regurgitation.   4. The aortic valve is normal in structure. Aortic valve regurgitation is  not visualized.   5. The inferior vena cava is normal in size with greater than 50%  respiratory variability, suggesting right atrial pressure of 3 mmHg.      Cardiac event monitor 10/11/15-11/09/15: No evidence of atrial fibrillation Occasional PVCs/sinus tachycardia associated with symptoms of flutter/palpitations Brief 10 beat run of paroxysmal atrial tachycardia     CT Cardiac (for evaluation of LA anatomy 09/20/10, [facility]): - Coronary arteries: The study is not optimized for specific coronary artery disease assessment. Grossly the circulation appears right left dominant There was no significant coronary artery calcification noted.  - Cardiac Chambers: The right ventricle is qualitatively normal in size. The left ventricle is qualitatively normal in size.  The right atrium is normal in size.  - Left Atria: The left atrium is mildly dilated.  - Pericardium: There is no significant pericardial thickening or effusion.  - Aorta: Qualitatively the aorta is normal in size.  - Pulmonary Arteries: Qualitatively the main and right pulmonary arteries are upper limit of normal in size.  The left pulmonary artery is mild dilated.  - Pulmonary Veins: There are 2 left-sided and 2 right-sided pulmonary veins.  - Left atrial appendage: 2.60 cm by 1.30 cm with an area of 2.60 square cm  - Esophagus: The esophagus is midline.  - Non-Cardiac Findings: Vascular calcification in the left breast. Apparent asymmetrically  increased  glandular tissue in the left breast may be artifactual since a large lateral portion of the right breast is not included in the field of view. Mammography is suggested if the patient has not had a recent mammogram. IMPRESSIONS: - Pulmonary vein anatomy as noted above - Mildly dilated left atrium - Upper normal to mIldly dilated pulmonary arteries - Breast findings as noted above    Cardiac cath 01/24/09: Summary: 1.  Normal coronary arteries without evidence of angiographically significant disease. 2.  Normal left ventricular function. 3.  No significant aortic gradient.  No significant mitral regurgitation.    Past Medical History:  Diagnosis Date   Abdominal wall mass 12/09/2020   lipoma, s/p excision 12/15/20 at time of cholecystectomy   Arthritis 12/09/2020   oa   Asthma    Cancer (HCC) 07/02/1993   breast cancer lt with 12 nodes out, chemo and radiation done  Chronic cholecystitis 12/09/2020   s/p cholecystectomy 12/15/20   chronic kidney disease 12/09/2020   stage 3 per pt   Contact lens/glasses fitting    wears contacts or glasses   COVID    jan  2022 cold and headache x 7 -8 days/all symptoms resolved per pt   Depression    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   Dysrhythmia    hx AF had ablation in 2012, has occ pvc's per pt   Fatty liver    Fibromyalgia    GERD (gastroesophageal reflux disease)    History of kidney stones    passed on own in past per pt   Hyperlipemia    Hypertension    IBS (irritable bowel syndrome)    migraines 12/09/2020   with weather changes per pt   PONV (postoperative nausea and vomiting)    Sciatica 12/09/2020   right butt cheek and right leg and lower spine   Seasonal allergies    Shortness of breath 12/09/2020   on exertion   Sleep apnea    cpap broken waiting on new cpap, severe osa last sleep study home few months ago ( 12-09-2020 )   Uses walker 12/09/2020   left walker with friend out of the area   Wears glasses 12/09/2020     Past Surgical History:  Procedure Laterality Date   BREAST LUMPECTOMY WITH AXILLARY LYMPH NODE DISSECTION Left 07/02/1993   12 nodes removed   CARDIAC CATHETERIZATION  01/24/2009   left heart   CARDIAC ELECTROPHYSIOLOGY MAPPING AND ABLATION  07/02/2010   CARPAL TUNNEL RELEASE Right 10/14/2012   Procedure: CARPAL TUNNEL RELEASE;  Surgeon: Nicki Reaper, MD;  Location: [facility];  Service: Orthopedics;  Laterality: Right;   CHOLECYSTECTOMY N/A 12/15/2020   Procedure: LAPAROSCOPIC CHOLECYSTECTOMY WITH INTRAOPERATIVE CHOLANGIOGRAM, TAP BLOCK;  Surgeon: Karie Soda, MD;  Location: [facility];  Service: General;  Laterality: N/A;   COLONOSCOPY  2021   DIAGNOSTIC LAPAROSCOPY  07/02/1988   EP IMPLANTABLE DEVICE N/A 03/08/2016   Procedure: Loop Recorder Insertion;  Surgeon: Hillis Range, MD;  Location: [facility];  Service: Cardiovascular;  Laterality: N/A;   LIVER BIOPSY N/A 12/15/2020   Procedure: NEEDLE CORE LIVER BIOPSY;  Surgeon: Karie Soda, MD;  Location: [facility];  Service: General;  Laterality: N/A;   LUMBAR FUSION  2011   MASS EXCISION N/A 12/15/2020   Procedure: EXCISION MASS ABDOMINAL WALL;  Surgeon: Karie Soda, MD;  Location:  SURGERY CENTER;  Service: General;  Laterality: N/A;   TONSILLECTOMY     age 89 per pt    MEDICATIONS:  pregabalin (LYRICA) 75 MG capsule   ALPRAZolam (XANAX) 0.5 MG tablet   ARIPiprazole (ABILIFY) 2 MG tablet   b complex vitamins tablet   beclomethasone (QVAR) 80 MCG/ACT inhaler   Biotin 5 MG CAPS   Blood Glucose Monitoring Suppl DEVI   Calcium Citrate (CITRACAL PO)   carvedilol (COREG) 25 MG tablet   celecoxib (CELEBREX) 200 MG capsule   chlorthalidone (HYGROTON) 25 MG tablet   Cholecalciferol (VITAMIN D) 50 MCG (2000 UT) tablet   cyclobenzaprine (FLEXERIL) 10 MG tablet   dicyclomine (BENTYL) 20 MG tablet   diltiazem (CARDIZEM) 30 MG tablet   diphenhydrAMINE (BENADRYL)  25 MG tablet   docusate sodium (COLACE) 100 MG capsule   Dulaglutide (TRULICITY) 1.5 MG/0.5ML SOPN   DULoxetine (CYMBALTA) 60 MG capsule   glipiZIDE (GLUCOTROL XL) 5 MG 24 hr tablet   Glucose Blood (BLOOD  GLUCOSE TEST STRIPS) STRP   loperamide (IMODIUM) 2 MG capsule   magnesium oxide (MAG-OX) 400 (240 Mg) MG tablet   Multiple Vitamins-Minerals (MULTIVITAMIN ADULT PO)   omeprazole (PRILOSEC) 20 MG capsule   oxyCODONE (OXY IR/ROXICODONE) 5 MG immediate release tablet   potassium chloride SA (K-DUR,KLOR-CON) 20 MEQ tablet   rosuvastatin (CRESTOR) 20 MG tablet   SUMAtriptan (IMITREX) 100 MG tablet   telmisartan (MICARDIS) 80 MG tablet   traMADol (ULTRAM) 50 MG tablet   VENTOLIN HFA 108 (90 BASE) MCG/ACT inhaler   No current facility-administered medications for this encounter.

## 2023-01-08 NOTE — Progress Notes (Signed)
Surgical Instructions    Your procedure is scheduled on Monday January 14, 2023.  Report to [facility] Main Entrance "A" at 10:15 A.M., then check in with the Admitting office.  Call this number if you have problems the morning of surgery:  [phone]   If you have any questions prior to your surgery date call [phone]: Open Monday-Friday 8am-4pm If you experience any cold or flu symptoms such as cough, fever, chills, shortness of breath, etc. between now and your scheduled surgery, please notify us at the above number     Remember:  Do not eat after midnight the night before your surgery.  You may drink clear liquids until 9:15 the morning of your surgery.   Clear liquids allowed are: Water, Non-Citrus Juices (without pulp), Carbonated Beverages, Clear Tea, Black Coffee ONLY (NO MILK, CREAM OR POWDERED CREAMER of any kind), and Gatorade.    Take these medicines the morning of surgery with A SIP OF WATER:  ARIPiprazole (ABILIFY)  carvedilol (COREG)  DULoxetine (CYMBALTA)  omeprazole (PRILOSEC)  oxyCODONE (OXY IR/ROXICODONE)   If needed:  beclomethasone (QVAR)  dicyclomine (BENTYL)  diphenhydrAMINE (BENADRYL)  SUMAtriptan (IMITREX)  VENTOLIN Inhaler: please bring the day of surgery.  As of today, STOP taking any Aspirin (unless otherwise instructed by your surgeon) Aleve, Naproxen, Ibuprofen, Motrin, Advil, Goody's, BC's, all herbal medications, fish oil, and all vitamins.  Stop Dulaglutide (TRULICITY) 7 days prior to surgery. Last dose on 01/02/2023.  WHAT DO I DO ABOUT MY DIABETES MEDICATION?   Do not take oral diabetes medicines (pills) the morning of surgery. This includes glipiZIDE (GLUCOTROL XL).   The day of surgery, do not take other diabetes injectables, including Byetta (exenatide), Bydureon (exenatide ER), Victoza (liraglutide), or Trulicity (dulaglutide).  If your CBG is greater than 220 mg/dL, you may take  of your sliding scale (correction) dose of  insulin.   HOW TO MANAGE YOUR DIABETES BEFORE AND AFTER SURGERY  Why is it important to control my blood sugar before and after surgery? Improving blood sugar levels before and after surgery helps healing and can limit problems. A way of improving blood sugar control is eating a healthy diet by:  Eating less sugar and carbohydrates  Increasing activity/exercise  Talking with your doctor about reaching your blood sugar goals High blood sugars (greater than 180 mg/dL) can raise your risk of infections and slow your recovery, so you will need to focus on controlling your diabetes during the weeks before surgery. Make sure that the doctor who takes care of your diabetes knows about your planned surgery including the date and location.  How do I manage my blood sugar before surgery? Check your blood sugar at least 4 times a day, starting 2 days before surgery, to make sure that the level is not too high or low.  Check your blood sugar the morning of your surgery when you wake up and every 2 hours until you get to the Short Stay unit.  If your blood sugar is less than 70 mg/dL, you will need to treat for low blood sugar: Do not take insulin. Treat a low blood sugar (less than 70 mg/dL) with  cup of clear juice (cranberry or apple), 4 glucose tablets, OR glucose gel. Recheck blood sugar in 15 minutes after treatment (to make sure it is greater than 70 mg/dL). If your blood sugar is not greater than 70 mg/dL on recheck, call [phone] for further instructions. Report your blood sugar to the short stay nurse when  you get to Short Stay.  If you are admitted to the hospital after surgery: Your blood sugar will be checked by the staff and you will probably be given insulin after surgery (instead of oral diabetes medicines) to make sure you have good blood sugar levels. The goal for blood sugar control after surgery is 80-180 mg/dL.   Special instructions:    Oral Hygiene is also important to  reduce your risk of infection.  Remember - BRUSH YOUR TEETH THE MORNING OF SURGERY WITH YOUR REGULAR TOOTHPASTE.     Pre-operative 5 CHG Bath Instructions   You can play a key role in reducing the risk of infection after surgery. Your skin needs to be as free of germs as possible. You can reduce the number of germs on your skin by washing with CHG (chlorhexidine gluconate) soap before surgery. CHG is an antiseptic soap that kills germs and continues to kill germs even after washing.   DO NOT use if you have an allergy to chlorhexidine/CHG or antibacterial soaps. If your skin becomes reddened or irritated, stop using the CHG and notify one of our RNs at [phone].   Please shower with the CHG soap starting 4 days before surgery using the following schedule:     Please keep in mind the following:  DO NOT shave, including legs and underarms, starting the day of your first shower.   You may shave your face at any point before/day of surgery.  Place clean sheets on your bed the day you start using CHG soap. Use a clean washcloth (not used since being washed) for each shower. DO NOT sleep with pets once you start using the CHG.   CHG Shower Instructions:  If you choose to wash your hair and private area, wash first with your normal shampoo/soap.  After you use shampoo/soap, rinse your hair and body thoroughly to remove shampoo/soap residue.  Turn the water OFF and apply about 3 tablespoons (45 ml) of CHG soap to a CLEAN washcloth.  Apply CHG soap ONLY FROM YOUR NECK DOWN TO YOUR TOES (washing for 3-5 minutes)  DO NOT use CHG soap on face, private areas, open wounds, or sores.  Pay special attention to the area where your surgery is being performed.  If you are having back surgery, having someone wash your back for you may be helpful. Wait 2 minutes after CHG soap is applied, then you may rinse off the CHG soap.  Pat dry with a clean towel  Put on clean clothes/pajamas   If you choose to  wear lotion, please use ONLY the CHG-compatible lotions on the back of this paper.     Additional instructions for the day of surgery: DO NOT APPLY any lotions, deodorants, cologne, or perfumes.   Put on clean/comfortable clothes.  Brush your teeth.  Ask your nurse before applying any prescription medications to the skin.      CHG Compatible Lotions   Aveeno Moisturizing lotion  Cetaphil Moisturizing Cream  Cetaphil Moisturizing Lotion  Clairol Herbal Essence Moisturizing Lotion, Dry Skin  Clairol Herbal Essence Moisturizing Lotion, Extra Dry Skin  Clairol Herbal Essence Moisturizing Lotion, Normal Skin  Curel Age Defying Therapeutic Moisturizing Lotion with Alpha Hydroxy  Curel Extreme Care Body Lotion  Curel Soothing Hands Moisturizing Hand Lotion  Curel Therapeutic Moisturizing Cream, Fragrance-Free  Curel Therapeutic Moisturizing Lotion, Fragrance-Free  Curel Therapeutic Moisturizing Lotion, Original Formula  Eucerin Daily Replenishing Lotion  Eucerin Dry Skin Therapy Plus Alpha Hydroxy Crme  Eucerin Dry  Skin Therapy Plus Alpha Hydroxy Lotion  Eucerin Original Crme  Eucerin Original Lotion  Eucerin Plus Crme Eucerin Plus Lotion  Eucerin TriLipid Replenishing Lotion  Keri Anti-Bacterial Hand Lotion  Keri Deep Conditioning Original Lotion Dry Skin Formula Softly Scented  Keri Deep Conditioning Original Lotion, Fragrance Free Sensitive Skin Formula  Keri Lotion Fast Absorbing Fragrance Free Sensitive Skin Formula  Keri Lotion Fast Absorbing Softly Scented Dry Skin Formula  Keri Original Lotion  Keri Skin Renewal Lotion Keri Silky Smooth Lotion  Keri Silky Smooth Sensitive Skin Lotion  Nivea Body Creamy Conditioning Oil  Nivea Body Extra Enriched Lotion  Nivea Body Original Lotion  Nivea Body Sheer Moisturizing Lotion Nivea Crme  Nivea Skin Firming Lotion  NutraDerm 30 Skin Lotion  NutraDerm Skin Lotion  NutraDerm Therapeutic Skin Cream  NutraDerm Therapeutic Skin  Lotion  ProShield Protective Hand Cream  Provon moisturizing lotion    Day of Surgery:  Take a shower with CHG soap. Wear Clean/Comfortable clothing the morning of surgery Do not apply any deodorants/lotions.   Remember to brush your teeth WITH YOUR REGULAR TOOTHPASTE.  [facility] is not responsible for any belongings or valuables.    Do NOT Smoke (Tobacco/Vaping)  24 hours prior to your procedure  If you use a CPAP at night, you may bring your mask for your overnight stay.   Contacts, glasses, hearing aids, dentures or partials may not be worn into surgery, please bring cases for these belongings   For patients admitted to the hospital, discharge time will be determined by your treatment team.   Patients discharged the day of surgery will not be allowed to drive home, and someone needs to stay with them for 24 hours.   SURGICAL WAITING ROOM VISITATION Patients having surgery or a procedure may have no more than 2 support people in the waiting area - these visitors may rotate.   Children under the age of 61 must have an adult with them who is not the patient. If the patient needs to stay at the hospital during part of their recovery, the visitor guidelines for inpatient rooms apply. Pre-op nurse will coordinate an appropriate time for 1 support person to accompany patient in pre-op.  This support person may not rotate.   Please refer to [email or web address] for the visitor guidelines for Inpatients (after your surgery is over and you are in a regular room).   If you received a COVID test during your pre-op visit, it is requested that you wear a mask when out in public, stay away from anyone that may not be feeling well, and notify your surgeon if you develop symptoms. If you have been in contact with anyone that has tested positive in the last 10 days, please notify your surgeon.    Please read over the following fact sheets that  you were given.

## 2023-01-08 NOTE — Progress Notes (Addendum)
PCP - Dr. Mosetta Putt Cardiologist - denies Pulmonologist- Dr. Cyril Mourning Endocrinologist- Dr. Clearance Coots  PPM/ICD - denies    Chest x-ray - 12/03/22 EKG - 12/04/22 Stress Test - 10+ years ago per pt, normal per pt. She thinks that was done here at [facility] ECHO - 10/03/20 Cardiac Cath - 01/24/09  Sleep Study - OSA+ CPAP - denies, pt states that she is awaiting a new CPAP machine  Fasting Blood Sugar - 100-110 Checks Blood Sugar twice a day  Last dose of GLP1 agonist-  01/02/23 GLP1 instructions: Hold 7 days prior to surgery. Do not take dose on 7/10  ASA/Blood Thinner Instructions: n/a   ERAS Protcol - yes, no drink   COVID TEST- n/a   Anesthesia review: Pt has loop recorder. Hx of Afib ablation. Pt states she has not had issues since and has not seen cardiology since ablation. She states she had a syncopal episode about 2 weeks ago. She has been seen by PCP since then and states that he is aware. Revonda Standard saw pt in PAT.  Patient denies shortness of breath, fever, cough and chest pain at PAT appointment   All instructions explained to the patient, with a verbal understanding of the material. Patient agrees to go over the instructions while at home for a better understanding.  The opportunity to ask questions was provided.

## 2023-01-10 NOTE — Anesthesia Preprocedure Evaluation (Addendum)
Anesthesia Evaluation  Patient identified by MRN, date of birth, ID band Patient awake    Reviewed: Allergy & Precautions, NPO status , Patient's Chart, lab work & pertinent test results, reviewed documented beta blocker date and time   History of Anesthesia Complications (+) PONV and history of anesthetic complications  Airway Mallampati: III  TM Distance: >3 FB Neck ROM: Full    Dental  (+) Dental Advisory Given, Teeth Intact   Pulmonary shortness of breath, asthma , sleep apnea and Continuous Positive Airway Pressure Ventilation , COPD,  COPD inhaler, former smoker   Pulmonary exam normal breath sounds clear to auscultation       Cardiovascular hypertension, Pt. on home beta blockers and Pt. on medications (-) angina (-) CAD + dysrhythmias (s/p successful ablation) Atrial Fibrillation  Rhythm:Regular Rate:Normal  Echo 10/03/20  1. Left ventricular ejection fraction, by estimation, is 50 to 55%. The left ventricle has low normal function. The left ventricle has no regional wall motion abnormalities. Left ventricular diastolic parameters are indeterminate.   2. Right ventricular systolic function is normal. The right ventricular size is normal. Mildly increased right ventricular wall thickness.   3. The mitral valve is normal in structure. Trivial mitral valve regurgitation.   4. The aortic valve is normal in structure. Aortic valve regurgitation is not visualized.   5. The inferior vena cava is normal in size with greater than 50% respiratory variability, suggesting right atrial pressure of 3 mmHg.     Neuro/Psych  Headaches PSYCHIATRIC DISORDERS  Depression    Chronic back pain: walker    GI/Hepatic Neg liver ROS,GERD  Controlled and Medicated,,  Endo/Other  diabetes, Poorly Controlled, Oral Hypoglycemic Agents  Morbid obesity  Renal/GU Renal InsufficiencyRenal disease     Musculoskeletal  (+) Arthritis ,  Fibromyalgia -   Abdominal  (+) + obese  Peds  Hematology negative hematology ROS (+)   Anesthesia Other Findings H/o breast cancer  Reproductive/Obstetrics                             Anesthesia Physical Anesthesia Plan  ASA: 3  Anesthesia Plan: General   Post-op Pain Management: Ofirmev IV (intra-op)*   Induction: Intravenous  PONV Risk Score and Plan: 3 and Ondansetron, Dexamethasone, Treatment may vary due to age or medical condition and Propofol infusion  Airway Management Planned: Oral ETT  Additional Equipment: None  Intra-op Plan:   Post-operative Plan: Extubation in OR  Informed Consent: I have reviewed the patients History and Physical, chart, labs and discussed the procedure including the risks, benefits and alternatives for the proposed anesthesia with the patient or authorized representative who has indicated his/her understanding and acceptance.     Dental advisory given  Plan Discussed with: CRNA  Anesthesia Plan Comments: (PAT note written by Shonna Chock, PA-C.  )        Anesthesia Quick Evaluation

## 2023-01-14 ENCOUNTER — Other Ambulatory Visit: Payer: Self-pay

## 2023-01-14 ENCOUNTER — Ambulatory Visit: Payer: Medicare HMO | Admitting: "Endocrinology

## 2023-01-14 ENCOUNTER — Ambulatory Visit (HOSPITAL_COMMUNITY): Payer: Medicare HMO

## 2023-01-14 ENCOUNTER — Ambulatory Visit (HOSPITAL_COMMUNITY): Payer: Medicare HMO | Admitting: Vascular Surgery

## 2023-01-14 ENCOUNTER — Ambulatory Visit (HOSPITAL_COMMUNITY)
Admission: RE | Disposition: A | Discharge status: Home Health | Payer: Self-pay | Source: Home / Self Care | Attending: Neurological Surgery

## 2023-01-14 ENCOUNTER — Ambulatory Visit (HOSPITAL_BASED_OUTPATIENT_CLINIC_OR_DEPARTMENT_OTHER): Payer: Medicare HMO | Admitting: Certified Registered"

## 2023-01-14 ENCOUNTER — Encounter (HOSPITAL_COMMUNITY): Payer: Self-pay | Admitting: Neurological Surgery

## 2023-01-14 ENCOUNTER — Observation Stay (HOSPITAL_COMMUNITY)
Admission: RE | Admit: 2023-01-14 | Discharge: 2023-01-15 | Disposition: A | Discharge status: Home Health | Payer: Medicare HMO | Attending: Neurological Surgery | Admitting: Neurological Surgery

## 2023-01-14 DIAGNOSIS — J449 Chronic obstructive pulmonary disease, unspecified: Secondary | ICD-10-CM | POA: Diagnosis not present

## 2023-01-14 DIAGNOSIS — Z87891 Personal history of nicotine dependence: Secondary | ICD-10-CM | POA: Diagnosis not present

## 2023-01-14 DIAGNOSIS — R7309 Other abnormal glucose: Secondary | ICD-10-CM | POA: Diagnosis not present

## 2023-01-14 DIAGNOSIS — M5116 Intervertebral disc disorders with radiculopathy, lumbar region: Principal | ICD-10-CM | POA: Insufficient documentation

## 2023-01-14 DIAGNOSIS — I10 Essential (primary) hypertension: Secondary | ICD-10-CM | POA: Diagnosis not present

## 2023-01-14 DIAGNOSIS — Z8616 Personal history of COVID-19: Secondary | ICD-10-CM | POA: Diagnosis not present

## 2023-01-14 DIAGNOSIS — M5126 Other intervertebral disc displacement, lumbar region: Principal | ICD-10-CM | POA: Diagnosis present

## 2023-01-14 DIAGNOSIS — I129 Hypertensive chronic kidney disease with stage 1 through stage 4 chronic kidney disease, or unspecified chronic kidney disease: Secondary | ICD-10-CM | POA: Diagnosis not present

## 2023-01-14 DIAGNOSIS — M48061 Spinal stenosis, lumbar region without neurogenic claudication: Secondary | ICD-10-CM | POA: Insufficient documentation

## 2023-01-14 DIAGNOSIS — N183 Chronic kidney disease, stage 3 unspecified: Secondary | ICD-10-CM | POA: Insufficient documentation

## 2023-01-14 DIAGNOSIS — I4891 Unspecified atrial fibrillation: Secondary | ICD-10-CM | POA: Insufficient documentation

## 2023-01-14 DIAGNOSIS — M4316 Spondylolisthesis, lumbar region: Secondary | ICD-10-CM | POA: Diagnosis not present

## 2023-01-14 DIAGNOSIS — Z853 Personal history of malignant neoplasm of breast: Secondary | ICD-10-CM | POA: Insufficient documentation

## 2023-01-14 DIAGNOSIS — J45909 Unspecified asthma, uncomplicated: Secondary | ICD-10-CM | POA: Insufficient documentation

## 2023-01-14 DIAGNOSIS — E119 Type 2 diabetes mellitus without complications: Secondary | ICD-10-CM

## 2023-01-14 LAB — GLUCOSE, CAPILLARY
Glucose-Capillary: 122 mg/dL — ABNORMAL HIGH (ref 70–99)
Glucose-Capillary: 135 mg/dL — ABNORMAL HIGH (ref 70–99)
Glucose-Capillary: 153 mg/dL — ABNORMAL HIGH (ref 70–99)
Glucose-Capillary: 344 mg/dL — ABNORMAL HIGH (ref 70–99)

## 2023-01-14 SURGERY — POSTERIOR LUMBAR FUSION 1 LEVEL
Anesthesia: General | Site: Back | Laterality: Bilateral

## 2023-01-14 MED ORDER — LIDOCAINE-EPINEPHRINE 1 %-1:100000 IJ SOLN
INTRAMUSCULAR | Status: AC
  Filled 2023-01-14: qty 1

## 2023-01-14 MED ORDER — ONDANSETRON HCL 4 MG/2ML IJ SOLN: 4.0000 mg | Freq: Four times a day (QID) | INTRAMUSCULAR | Status: DC | PRN

## 2023-01-14 MED ORDER — PHENYLEPHRINE HCL-NACL 20-0.9 MG/250ML-% IV SOLN
INTRAVENOUS | Status: DC | PRN
  Administered 2023-01-14: 25 ug/min via INTRAVENOUS

## 2023-01-14 MED ORDER — FLEET ENEMA 7-19 GM/118ML RE ENEM: 1.0000 | ENEMA | Freq: Once | RECTAL | Status: DC | PRN

## 2023-01-14 MED ORDER — ONDANSETRON HCL 4 MG/2ML IJ SOLN
INTRAMUSCULAR | Status: AC
  Filled 2023-01-14: qty 2

## 2023-01-14 MED ORDER — SODIUM CHLORIDE 0.9% FLUSH: 3.0000 mL | INTRAVENOUS | Status: DC | PRN

## 2023-01-14 MED ORDER — POLYETHYLENE GLYCOL 3350 17 G PO PACK: 17.0000 g | PACK | Freq: Every day | ORAL | Status: DC | PRN

## 2023-01-14 MED ORDER — VANCOMYCIN HCL IN DEXTROSE 1-5 GM/200ML-% IV SOLN
1000.0000 mg | INTRAVENOUS | Status: AC
  Administered 2023-01-14: 1000 mg via INTRAVENOUS
  Filled 2023-01-14: qty 200

## 2023-01-14 MED ORDER — HYDROMORPHONE HCL 1 MG/ML IJ SOLN
0.2500 mg | INTRAMUSCULAR | Status: DC | PRN
  Administered 2023-01-14 (×2): 0.5 mg via INTRAVENOUS

## 2023-01-14 MED ORDER — METHOCARBAMOL 500 MG PO TABS
500.0000 mg | ORAL_TABLET | Freq: Four times a day (QID) | ORAL | Status: DC | PRN
  Administered 2023-01-14 – 2023-01-15 (×2): 500 mg via ORAL
  Filled 2023-01-14 (×2): qty 1

## 2023-01-14 MED ORDER — PROPOFOL 10 MG/ML IV BOLUS
INTRAVENOUS | Status: DC | PRN
Start: 2023-01-14 — End: 2023-01-14
  Administered 2023-01-14: 130 mg via INTRAVENOUS

## 2023-01-14 MED ORDER — INSULIN ASPART 100 UNIT/ML IJ SOLN
0.0000 [IU] | Freq: Every day | INTRAMUSCULAR | Status: DC
  Administered 2023-01-14: 4 [IU] via SUBCUTANEOUS

## 2023-01-14 MED ORDER — SUGAMMADEX SODIUM 200 MG/2ML IV SOLN
INTRAVENOUS | Status: DC | PRN
  Administered 2023-01-14: 191.2 mg via INTRAVENOUS

## 2023-01-14 MED ORDER — DULAGLUTIDE 1.5 MG/0.5ML ~~LOC~~ SOAJ: 1.5000 mg | SUBCUTANEOUS | Status: DC

## 2023-01-14 MED ORDER — PHENOL 1.4 % MT LIQD: 1.0000 | OROMUCOSAL | Status: DC | PRN

## 2023-01-14 MED ORDER — SODIUM CHLORIDE 0.9 % IV SOLN: 250.0000 mL | INTRAVENOUS | Status: DC

## 2023-01-14 MED ORDER — DILTIAZEM HCL 30 MG PO TABS: 30.0000 mg | ORAL_TABLET | ORAL | Status: DC | PRN

## 2023-01-14 MED ORDER — GLIPIZIDE ER 5 MG PO TB24
5.0000 mg | ORAL_TABLET | Freq: Every day | ORAL | Status: DC
  Administered 2023-01-15: 5 mg via ORAL
  Filled 2023-01-14: qty 1

## 2023-01-14 MED ORDER — BUDESONIDE 0.25 MG/2ML IN SUSP
0.2500 mg | Freq: Two times a day (BID) | RESPIRATORY_TRACT | Status: DC
  Administered 2023-01-14 – 2023-01-15 (×2): 0.25 mg via RESPIRATORY_TRACT
  Filled 2023-01-14 (×4): qty 2

## 2023-01-14 MED ORDER — INSULIN ASPART 100 UNIT/ML IJ SOLN: 0.0000 [IU] | Freq: Three times a day (TID) | INTRAMUSCULAR | Status: DC

## 2023-01-14 MED ORDER — TRAMADOL HCL 50 MG PO TABS: 50.0000 mg | ORAL_TABLET | Freq: Four times a day (QID) | ORAL | Status: DC | PRN

## 2023-01-14 MED ORDER — THROMBIN 5000 UNITS EX SOLR
CUTANEOUS | Status: AC
  Filled 2023-01-14: qty 5000

## 2023-01-14 MED ORDER — LACTATED RINGERS IV SOLN: INTRAVENOUS | Status: DC

## 2023-01-14 MED ORDER — ORAL CARE MOUTH RINSE: 15.0000 mL | Freq: Once | OROMUCOSAL | Status: AC

## 2023-01-14 MED ORDER — FENTANYL CITRATE (PF) 250 MCG/5ML IJ SOLN
INTRAMUSCULAR | Status: AC
  Filled 2023-01-14: qty 5

## 2023-01-14 MED ORDER — ALBUTEROL SULFATE (2.5 MG/3ML) 0.083% IN NEBU: 2.5000 mg | INHALATION_SOLUTION | Freq: Four times a day (QID) | RESPIRATORY_TRACT | Status: DC | PRN

## 2023-01-14 MED ORDER — ONDANSETRON HCL 4 MG PO TABS: 4.0000 mg | ORAL_TABLET | Freq: Four times a day (QID) | ORAL | Status: DC | PRN

## 2023-01-14 MED ORDER — CHLORHEXIDINE GLUCONATE 0.12 % MT SOLN
15.0000 mL | Freq: Once | OROMUCOSAL | Status: AC
  Administered 2023-01-14: 15 mL via OROMUCOSAL
  Filled 2023-01-14: qty 15

## 2023-01-14 MED ORDER — HYDROMORPHONE HCL 1 MG/ML IJ SOLN
INTRAMUSCULAR | Status: AC
  Filled 2023-01-14: qty 1

## 2023-01-14 MED ORDER — CHLORTHALIDONE 25 MG PO TABS
25.0000 mg | ORAL_TABLET | Freq: Every day | ORAL | Status: DC
  Administered 2023-01-15: 25 mg via ORAL
  Filled 2023-01-14: qty 1

## 2023-01-14 MED ORDER — ALBUMIN HUMAN 5 % IV SOLN: INTRAVENOUS | Status: DC | PRN

## 2023-01-14 MED ORDER — ALBUTEROL SULFATE HFA 108 (90 BASE) MCG/ACT IN AERS: 1.0000 | INHALATION_SPRAY | Freq: Four times a day (QID) | RESPIRATORY_TRACT | Status: DC | PRN

## 2023-01-14 MED ORDER — 0.9 % SODIUM CHLORIDE (POUR BTL) OPTIME
TOPICAL | Status: DC | PRN
  Administered 2023-01-14: 1000 mL

## 2023-01-14 MED ORDER — LIDOCAINE-EPINEPHRINE 1 %-1:100000 IJ SOLN
INTRAMUSCULAR | Status: DC | PRN
  Administered 2023-01-14: 5 mL

## 2023-01-14 MED ORDER — INSULIN ASPART 100 UNIT/ML IJ SOLN: 0.0000 [IU] | INTRAMUSCULAR | Status: DC | PRN

## 2023-01-14 MED ORDER — DOCUSATE SODIUM 100 MG PO CAPS
100.0000 mg | ORAL_CAPSULE | Freq: Two times a day (BID) | ORAL | Status: DC
  Administered 2023-01-14 – 2023-01-15 (×2): 100 mg via ORAL
  Filled 2023-01-14 (×2): qty 1

## 2023-01-14 MED ORDER — POTASSIUM CHLORIDE CRYS ER 20 MEQ PO TBCR
20.0000 meq | EXTENDED_RELEASE_TABLET | Freq: Every day | ORAL | Status: DC
  Administered 2023-01-15: 20 meq via ORAL
  Filled 2023-01-14: qty 1

## 2023-01-14 MED ORDER — OXYCODONE HCL 5 MG PO TABS
5.0000 mg | ORAL_TABLET | ORAL | Status: DC | PRN
  Administered 2023-01-14 – 2023-01-15 (×3): 5 mg via ORAL
  Filled 2023-01-14 (×3): qty 1

## 2023-01-14 MED ORDER — ROCURONIUM BROMIDE 10 MG/ML (PF) SYRINGE
PREFILLED_SYRINGE | INTRAVENOUS | Status: AC
  Filled 2023-01-14: qty 10

## 2023-01-14 MED ORDER — BUPIVACAINE HCL (PF) 0.5 % IJ SOLN
INTRAMUSCULAR | Status: AC
  Filled 2023-01-14: qty 30

## 2023-01-14 MED ORDER — VANCOMYCIN HCL IN DEXTROSE 1-5 GM/200ML-% IV SOLN
1000.0000 mg | Freq: Once | INTRAVENOUS | Status: AC
  Administered 2023-01-15: 1000 mg via INTRAVENOUS
  Filled 2023-01-14: qty 200

## 2023-01-14 MED ORDER — IRBESARTAN 150 MG PO TABS
300.0000 mg | ORAL_TABLET | Freq: Every day | ORAL | Status: DC
  Administered 2023-01-15: 300 mg via ORAL
  Filled 2023-01-14: qty 2

## 2023-01-14 MED ORDER — LIDOCAINE 2% (20 MG/ML) 5 ML SYRINGE
INTRAMUSCULAR | Status: DC | PRN
  Administered 2023-01-14: 80 mg via INTRAVENOUS

## 2023-01-14 MED ORDER — ALPRAZOLAM 0.5 MG PO TABS: 0.5000 mg | ORAL_TABLET | Freq: Every evening | ORAL | Status: DC | PRN

## 2023-01-14 MED ORDER — LOPERAMIDE HCL 2 MG PO CAPS: 2.0000 mg | ORAL_CAPSULE | ORAL | Status: DC | PRN

## 2023-01-14 MED ORDER — MORPHINE SULFATE (PF) 2 MG/ML IV SOLN: 2.0000 mg | INTRAVENOUS | Status: DC | PRN

## 2023-01-14 MED ORDER — DICYCLOMINE HCL 20 MG PO TABS: 20.0000 mg | ORAL_TABLET | Freq: Three times a day (TID) | ORAL | Status: DC | PRN

## 2023-01-14 MED ORDER — AMISULPRIDE (ANTIEMETIC) 5 MG/2ML IV SOLN: 10.0000 mg | Freq: Once | INTRAVENOUS | Status: DC | PRN

## 2023-01-14 MED ORDER — CYCLOBENZAPRINE HCL 10 MG PO TABS: 10.0000 mg | ORAL_TABLET | Freq: Every evening | ORAL | Status: DC | PRN

## 2023-01-14 MED ORDER — MIDAZOLAM HCL 2 MG/2ML IJ SOLN
INTRAMUSCULAR | Status: AC
  Filled 2023-01-14: qty 2

## 2023-01-14 MED ORDER — CHLORHEXIDINE GLUCONATE CLOTH 2 % EX PADS: 6.0000 | MEDICATED_PAD | Freq: Once | CUTANEOUS | Status: DC

## 2023-01-14 MED ORDER — ONDANSETRON HCL 4 MG/2ML IJ SOLN
INTRAMUSCULAR | Status: DC | PRN
  Administered 2023-01-14 (×2): 4 mg via INTRAVENOUS

## 2023-01-14 MED ORDER — ROCURONIUM BROMIDE 10 MG/ML (PF) SYRINGE
PREFILLED_SYRINGE | INTRAVENOUS | Status: DC | PRN
  Administered 2023-01-14: 40 mg via INTRAVENOUS
  Administered 2023-01-14: 60 mg via INTRAVENOUS

## 2023-01-14 MED ORDER — PHENYLEPHRINE 80 MCG/ML (10ML) SYRINGE FOR IV PUSH (FOR BLOOD PRESSURE SUPPORT)
PREFILLED_SYRINGE | INTRAVENOUS | Status: DC | PRN
  Administered 2023-01-14: 80 ug via INTRAVENOUS
  Administered 2023-01-14: 200 ug via INTRAVENOUS
  Administered 2023-01-14: 80 ug via INTRAVENOUS
  Administered 2023-01-14: 160 ug via INTRAVENOUS

## 2023-01-14 MED ORDER — MAGNESIUM OXIDE -MG SUPPLEMENT 400 (240 MG) MG PO TABS
400.0000 mg | ORAL_TABLET | Freq: Every day | ORAL | Status: DC
  Administered 2023-01-14 – 2023-01-15 (×2): 400 mg via ORAL
  Filled 2023-01-14 (×2): qty 1

## 2023-01-14 MED ORDER — ARIPIPRAZOLE 2 MG PO TABS
2.0000 mg | ORAL_TABLET | Freq: Every morning | ORAL | Status: DC
  Administered 2023-01-15: 2 mg via ORAL
  Filled 2023-01-14: qty 1

## 2023-01-14 MED ORDER — ACETAMINOPHEN 650 MG RE SUPP: 650.0000 mg | RECTAL | Status: DC | PRN

## 2023-01-14 MED ORDER — SODIUM CHLORIDE 0.9% FLUSH: 3.0000 mL | Freq: Two times a day (BID) | INTRAVENOUS | Status: DC

## 2023-01-14 MED ORDER — INSULIN ASPART 100 UNIT/ML IJ SOLN
0.0000 [IU] | Freq: Three times a day (TID) | INTRAMUSCULAR | Status: DC
  Administered 2023-01-15 (×2): 7 [IU] via SUBCUTANEOUS

## 2023-01-14 MED ORDER — DEXAMETHASONE SODIUM PHOSPHATE 10 MG/ML IJ SOLN
INTRAMUSCULAR | Status: DC | PRN
  Administered 2023-01-14: 10 mg via INTRAVENOUS

## 2023-01-14 MED ORDER — PREGABALIN 75 MG PO CAPS
75.0000 mg | ORAL_CAPSULE | Freq: Two times a day (BID) | ORAL | Status: DC
  Administered 2023-01-14 – 2023-01-15 (×2): 75 mg via ORAL
  Filled 2023-01-14 (×2): qty 1

## 2023-01-14 MED ORDER — CARVEDILOL 25 MG PO TABS
25.0000 mg | ORAL_TABLET | Freq: Two times a day (BID) | ORAL | Status: DC
  Administered 2023-01-14 – 2023-01-15 (×2): 25 mg via ORAL
  Filled 2023-01-14 (×2): qty 1

## 2023-01-14 MED ORDER — DULOXETINE HCL 30 MG PO CPEP
60.0000 mg | ORAL_CAPSULE | Freq: Every day | ORAL | Status: DC
  Administered 2023-01-15: 60 mg via ORAL
  Filled 2023-01-14: qty 2

## 2023-01-14 MED ORDER — SENNA 8.6 MG PO TABS
1.0000 | ORAL_TABLET | Freq: Two times a day (BID) | ORAL | Status: DC
  Administered 2023-01-14 – 2023-01-15 (×2): 8.6 mg via ORAL
  Filled 2023-01-14 (×2): qty 1

## 2023-01-14 MED ORDER — MENTHOL 3 MG MT LOZG: 1.0000 | LOZENGE | OROMUCOSAL | Status: DC | PRN

## 2023-01-14 MED ORDER — EPHEDRINE 5 MG/ML INJ
INTRAVENOUS | Status: AC
  Filled 2023-01-14: qty 5

## 2023-01-14 MED ORDER — BUPIVACAINE HCL (PF) 0.5 % IJ SOLN
INTRAMUSCULAR | Status: DC | PRN
  Administered 2023-01-14: 5 mL
  Administered 2023-01-14: 25 mL

## 2023-01-14 MED ORDER — BISACODYL 10 MG RE SUPP: 10.0000 mg | Freq: Every day | RECTAL | Status: DC | PRN

## 2023-01-14 MED ORDER — THROMBIN 5000 UNITS EX SOLR: OROMUCOSAL | Status: DC | PRN

## 2023-01-14 MED ORDER — PANTOPRAZOLE SODIUM 40 MG PO TBEC
40.0000 mg | DELAYED_RELEASE_TABLET | Freq: Every day | ORAL | Status: DC
  Administered 2023-01-15: 40 mg via ORAL
  Filled 2023-01-14: qty 1

## 2023-01-14 MED ORDER — PROPOFOL 10 MG/ML IV BOLUS
INTRAVENOUS | Status: AC
  Filled 2023-01-14: qty 20

## 2023-01-14 MED ORDER — FENTANYL CITRATE (PF) 250 MCG/5ML IJ SOLN
INTRAMUSCULAR | Status: DC | PRN
  Administered 2023-01-14 (×2): 50 ug via INTRAVENOUS
  Administered 2023-01-14: 100 ug via INTRAVENOUS
  Administered 2023-01-14 (×2): 50 ug via INTRAVENOUS

## 2023-01-14 MED ORDER — DIPHENHYDRAMINE HCL 25 MG PO TABS: 25.0000 mg | ORAL_TABLET | Freq: Four times a day (QID) | ORAL | Status: DC | PRN

## 2023-01-14 MED ORDER — SUMATRIPTAN SUCCINATE 100 MG PO TABS: 100.0000 mg | ORAL_TABLET | ORAL | Status: DC | PRN

## 2023-01-14 MED ORDER — METHOCARBAMOL 1000 MG/10ML IJ SOLN: 500.0000 mg | Freq: Four times a day (QID) | INTRAVENOUS | Status: DC | PRN

## 2023-01-14 MED ORDER — LIDOCAINE 2% (20 MG/ML) 5 ML SYRINGE
INTRAMUSCULAR | Status: AC
  Filled 2023-01-14: qty 5

## 2023-01-14 MED ORDER — ACETAMINOPHEN 325 MG PO TABS
650.0000 mg | ORAL_TABLET | ORAL | Status: DC | PRN
  Administered 2023-01-14 – 2023-01-15 (×2): 650 mg via ORAL
  Filled 2023-01-14 (×2): qty 2

## 2023-01-14 MED ORDER — EPHEDRINE SULFATE-NACL 50-0.9 MG/10ML-% IV SOSY
PREFILLED_SYRINGE | INTRAVENOUS | Status: DC | PRN
  Administered 2023-01-14: 5 mg via INTRAVENOUS

## 2023-01-14 MED ORDER — DOCUSATE SODIUM 100 MG PO CAPS: 100.0000 mg | ORAL_CAPSULE | Freq: Two times a day (BID) | ORAL | Status: DC

## 2023-01-14 MED ORDER — ROSUVASTATIN CALCIUM 20 MG PO TABS
20.0000 mg | ORAL_TABLET | Freq: Every day | ORAL | Status: DC
  Administered 2023-01-14: 20 mg via ORAL
  Filled 2023-01-14: qty 1

## 2023-01-14 SURGICAL SUPPLY — 76 items
ADH SKN CLS APL DERMABOND .7 (GAUZE/BANDAGES/DRESSINGS) ×1
APL SRG 60D 8 XTD TIP BNDBL (TIP)
BAG COUNTER SPONGE SURGICOUNT (BAG) ×1 IMPLANT
BAG SPNG CNTER NS LX DISP (BAG) ×1
BASKET BONE COLLECTION (BASKET) ×1 IMPLANT
BLADE BONE MILL MEDIUM (MISCELLANEOUS) ×1 IMPLANT
BLADE CLIPPER SURG (BLADE) IMPLANT
BONE CANC CHIPS 20CC PCAN1/4 (Bone Implant) ×1 IMPLANT
BUR MATCHSTICK NEURO 3.0 LAGG (BURR) ×1 IMPLANT
CAGE PLIF 8X9X23-12 LUMBAR (Cage) IMPLANT
CANISTER SUCT 3000ML PPV (MISCELLANEOUS) ×1 IMPLANT
CEMENT KYPHON CX01A KIT/MIXER (Cement) IMPLANT
CHIPS CANC BONE 20CC PCAN1/4 (Bone Implant) ×1 IMPLANT
CNTNR URN SCR LID CUP LEK RST (MISCELLANEOUS) ×1 IMPLANT
CONT SPEC 4OZ STRL OR WHT (MISCELLANEOUS) ×1
COVER BACK TABLE 60X90IN (DRAPES) ×1 IMPLANT
DERMABOND ADVANCED .7 DNX12 (GAUZE/BANDAGES/DRESSINGS) ×1 IMPLANT
DEVICE DISSECT PLASMABLAD 3.0S (MISCELLANEOUS) ×1 IMPLANT
DRAPE C-ARM 42X72 X-RAY (DRAPES) ×2 IMPLANT
DRAPE HALF SHEET 40X57 (DRAPES) IMPLANT
DRAPE LAPAROTOMY 100X72X124 (DRAPES) ×1 IMPLANT
DRSG OPSITE POSTOP 4X10 (GAUZE/BANDAGES/DRESSINGS) IMPLANT
DURAPREP 26ML APPLICATOR (WOUND CARE) ×1 IMPLANT
DURASEAL APPLICATOR TIP (TIP) IMPLANT
DURASEAL SPINE SEALANT 3ML (MISCELLANEOUS) IMPLANT
ELECT REM PT RETURN 9FT ADLT (ELECTROSURGICAL) ×1
ELECTRODE REM PT RTRN 9FT ADLT (ELECTROSURGICAL) ×1 IMPLANT
GAUZE 4X4 16PLY ~~LOC~~+RFID DBL (SPONGE) IMPLANT
GAUZE SPONGE 4X4 12PLY STRL (GAUZE/BANDAGES/DRESSINGS) ×1 IMPLANT
GLOVE BIOGEL PI IND STRL 8.5 (GLOVE) ×2 IMPLANT
GLOVE ECLIPSE 8.5 STRL (GLOVE) ×2 IMPLANT
GOWN STRL REUS W/ TWL LRG LVL3 (GOWN DISPOSABLE) IMPLANT
GOWN STRL REUS W/ TWL XL LVL3 (GOWN DISPOSABLE) IMPLANT
GOWN STRL REUS W/TWL 2XL LVL3 (GOWN DISPOSABLE) ×2 IMPLANT
GOWN STRL REUS W/TWL LRG LVL3 (GOWN DISPOSABLE)
GOWN STRL REUS W/TWL XL LVL3 (GOWN DISPOSABLE)
GRAFT BNE CANC CHIPS 1-8 20CC (Bone Implant) IMPLANT
GRAFT BONE PROTEIOS LRG 5CC (Orthopedic Implant) IMPLANT
HEMOSTAT POWDER KIT SURGIFOAM (HEMOSTASIS) ×1 IMPLANT
KIT BASIN OR (CUSTOM PROCEDURE TRAY) ×1 IMPLANT
KIT GRAFTMAG DEL NEURO DISP (NEUROSURGERY SUPPLIES) IMPLANT
KIT TURNOVER KIT B (KITS) ×1 IMPLANT
MILL BONE PREP (MISCELLANEOUS) ×1 IMPLANT
NDL HYPO 22X1.5 SAFETY MO (MISCELLANEOUS) ×1 IMPLANT
NEEDLE HYPO 22X1.5 SAFETY MO (MISCELLANEOUS) ×1 IMPLANT
NS IRRIG 1000ML POUR BTL (IV SOLUTION) ×1 IMPLANT
PACK LAMINECTOMY NEURO (CUSTOM PROCEDURE TRAY) ×1 IMPLANT
PAD ARMBOARD 7.5X6 YLW CONV (MISCELLANEOUS) ×3 IMPLANT
PATTIES SURGICAL .5 X1 (DISPOSABLE) ×1 IMPLANT
PLASMABLADE 3.0S (MISCELLANEOUS) ×1
PUSHER RELINE FENS MAS (MISCELLANEOUS) IMPLANT
RELINE MAS NDL FENS (NEEDLE) IMPLANT
RELINE MAS NEEDLE FENS (NEEDLE) ×4 IMPLANT
ROD POST ARMADA 5.5X55 (Rod) IMPLANT
ROD RELINE 0-0 CON M 5.0/6.0MM (Rod) IMPLANT
ROD RELINE TI LATERAL MED OFF (Rod) IMPLANT
SCREW LOCK RELINE 5.5 TULIP (Screw) IMPLANT
SCREW RELINE PA 2FC 5.5X50 (Screw) IMPLANT
SCREW RELINE PA 4.5X50 (Screw) IMPLANT
SOL ELECTROSURG ANTI STICK (MISCELLANEOUS)
SOLUTION ELECTROSURG ANTI STCK (MISCELLANEOUS) ×1 IMPLANT
SPIKE FLUID TRANSFER (MISCELLANEOUS) ×1 IMPLANT
SPONGE SURGIFOAM ABS GEL 100 (HEMOSTASIS) IMPLANT
SPONGE T-LAP 4X18 ~~LOC~~+RFID (SPONGE) IMPLANT
SUT PROLENE 6 0 BV (SUTURE) IMPLANT
SUT VIC AB 1 CT1 18XBRD ANBCTR (SUTURE) ×1 IMPLANT
SUT VIC AB 1 CT1 8-18 (SUTURE) ×1
SUT VIC AB 2-0 CP2 18 (SUTURE) ×1 IMPLANT
SUT VIC AB 3-0 SH 8-18 (SUTURE) ×1 IMPLANT
SUT VIC AB 4-0 RB1 18 (SUTURE) ×1 IMPLANT
SYR 3ML LL SCALE MARK (SYRINGE) ×4 IMPLANT
TIP CART INSTAFILL GL 5CC (ORTHOPEDIC DISPOSABLE SUPPLIES) IMPLANT
TOWEL GREEN STERILE (TOWEL DISPOSABLE) ×1 IMPLANT
TOWEL GREEN STERILE FF (TOWEL DISPOSABLE) ×1 IMPLANT
TRAY FOLEY MTR SLVR 16FR STAT (SET/KITS/TRAYS/PACK) ×1 IMPLANT
WATER STERILE IRR 1000ML POUR (IV SOLUTION) ×1 IMPLANT

## 2023-01-14 NOTE — Plan of Care (Signed)
  Problem: Education: Goal: Knowledge of General Education information will improve Description: Including pain rating scale, medication(s)/side effects and non-pharmacologic comfort measures Outcome: Completed/Met   Problem: Health Behavior/Discharge Planning: Goal: Ability to manage health-related needs will improve Outcome: Completed/Met   Problem: Clinical Measurements: Goal: Ability to maintain clinical measurements within normal limits will improve Outcome: Completed/Met Goal: Will remain free from infection Outcome: Completed/Met Goal: Diagnostic test results will improve Outcome: Completed/Met Goal: Respiratory complications will improve Outcome: Completed/Met Goal: Cardiovascular complication will be avoided Outcome: Completed/Met   Problem: Activity: Goal: Risk for activity intolerance will decrease Outcome: Completed/Met   Problem: Nutrition: Goal: Adequate nutrition will be maintained Outcome: Completed/Met   Problem: Coping: Goal: Level of anxiety will decrease Outcome: Completed/Met   Problem: Elimination: Goal: Will not experience complications related to bowel motility Outcome: Completed/Met Goal: Will not experience complications related to urinary retention Outcome: Completed/Met   Problem: Pain Managment: Goal: General experience of comfort will improve Outcome: Completed/Met   Problem: Safety: Goal: Ability to remain free from injury will improve Outcome: Completed/Met   Problem: Skin Integrity: Goal: Risk for impaired skin integrity will decrease Outcome: Completed/Met   Problem: Education: Goal: Ability to describe self-care measures that may prevent or decrease complications (Diabetes Survival Skills Education) will improve Outcome: Completed/Met   Problem: Coping: Goal: Ability to adjust to condition or change in health will improve Outcome: Completed/Met   Problem: Fluid Volume: Goal: Ability to maintain a balanced intake and output  will improve Outcome: Completed/Met   Problem: Health Behavior/Discharge Planning: Goal: Ability to identify and utilize available resources and services will improve Outcome: Completed/Met Goal: Ability to manage health-related needs will improve Outcome: Completed/Met   Problem: Metabolic: Goal: Ability to maintain appropriate glucose levels will improve Outcome: Completed/Met   Problem: Nutritional: Goal: Maintenance of adequate nutrition will improve Outcome: Completed/Met Goal: Progress toward achieving an optimal weight will improve Outcome: Completed/Met   Problem: Skin Integrity: Goal: Risk for impaired skin integrity will decrease Outcome: Completed/Met   Problem: Tissue Perfusion: Goal: Adequacy of tissue perfusion will improve Outcome: Completed/Met   Problem: Education: Goal: Ability to verbalize activity precautions or restrictions will improve Outcome: Completed/Met Goal: Knowledge of the prescribed therapeutic regimen will improve Outcome: Completed/Met Goal: Understanding of discharge needs will improve Outcome: Completed/Met   Problem: Activity: Goal: Ability to avoid complications of mobility impairment will improve Outcome: Completed/Met Goal: Ability to tolerate increased activity will improve Outcome: Completed/Met Goal: Will remain free from falls Outcome: Completed/Met   Problem: Bowel/Gastric: Goal: Gastrointestinal status for postoperative course will improve Outcome: Completed/Met   Problem: Clinical Measurements: Goal: Ability to maintain clinical measurements within normal limits will improve Outcome: Completed/Met Goal: Postoperative complications will be avoided or minimized Outcome: Completed/Met Goal: Diagnostic test results will improve Outcome: Completed/Met

## 2023-01-14 NOTE — Op Note (Signed)
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  Posterior lumbar interbody arthrodesis using peek spacers local autograft allograft and Prody os posterolateral arthrodesis with local autograft allograft and Prody os.  Pedicle screw fixation L2 to previous hardware L3-L5.  Fluoroscopic imaging. Surgeon: Barnett Abu Anesthesia: General endotracheal Indications: Katie Owens is a 72 year old individual who has had significant bilateral lower extremity pain and weakness proximally in the lower extremities for a number of months she was found to have a large herniated nucleus pulposus at the level of L2-L3 where she has had a previous decompression from L3-L5 with a fusion because of the severity of the stenosis I advised surgical decompression and arthrodesis and this is now being performed.  Procedure: Patient was brought to the operating room supine on the stretcher.  After the smooth induction of general endotracheal anesthesia and placement of Foley catheter the patient was carefully turned prone.  The back was prepped with alcohol DuraPrep and draped in a sterile fashion.  A midline incision was made through previous scar and dissection was carried down to the lumbodorsal fascia.  The fascia was opened on either side of the midline to expose the spinous processes of L3 and L2.  Then by dissecting over the interlaminar space at L2-L3 the transverse process of L2 was identified and decorticated and this was packed away for later use and grafting at L3 the previously placed pedicle screws were identified on each side left and right.  These were skeletonized with the rod below it being exposed and it was felt that a W connector could be placed on each rod to  secure fixation above.  This was done on both the left and the right sides.  The connectors were placed laminectomy was then performed removing the inferior margin lamina of L2 out to including the entirety of the facets at L2-L3.  Remainder of the top of the L3 spinous process that had been loosened from previous fracture was removed.  The laminectomy was extended inferiorly and the common dural tube was exposed and dissected all the way laterally to expose the takeoff of the L2 nerve root superiorly the foramen of the L2 nerve root and the L2-3 disc space was exposed on each side.  Path the L2 nerve root was decompressed into the foramen.  The disc was noted to be severely bulging dorsally and laterally and carefully dissecting the dura allowed mobilization of the dura and exposure of the thinned out her ligament this was incised and several large quantities of disc material were removed from the subligamentous space thus decompressing the central dural sac.  The process continued first on the left side than on the right side until a complete discectomy was performed and with this the pads of the L2 nerve root superiorly and the common dural tube and the L3 and L4 nerve roots inferiorly were well decompressed once the discectomy was completed and the endplates were "decorticated the interspace was sized for an appropriately sized spacer and was felt that an 8 x 9 x 23 mm spacer with 12 degrees lordosis would fit best into this interval.  Then 9 cc of bone graft was packed into the interspace along with the 2 spacers.  Lateral gutters which had been decorticated were packed with an additional 6 cc of bone graft in each lateral  gutter pedicle entry sites were then chosen it was noted that the L2 pedicles were very narrow and fine on the left side then we placed a 5.5 x 50 mm pedicle screw on the right side a 4.5 x 50 mm pedicle screw was able to be placed through the very narrow pedicle with the screws being placed  because the bone was soft we augmented the screws with methacrylate and 1 cc of methacrylate was placed into the left pedicle screw 2 cc of methacrylate was placed into the right pedicle screw.  The augmentation was felt to be good and complete there was a little bit of extravasation of the right sided screw outside the pedicle.  This was not in the region of the foramen or any neural structures.  Once the augmentation was completed precontoured the rods were used on the left side to connect L2-L3 on the right side connection could be made with a straight 55 mm 5.5 mm rod.  This was placed into the saddles and tightened in a neutral construct.  Final radiographs were obtained in AP and lateral projection and then care was taken to verify that the central canal lateral recess decompression was good hemostasis was well achieved.  25 cc of half percent Marcaine was injected into the paraspinous fascia and then the fascia was closed with #1 Vicryl in interrupted fashion 2-0 Vicryl in the subcutaneous tissues 3-0 and 4-0 Vicryl in the subcuticular layer Dermabond was placed on the skin.  Blood loss is estimated 550 cc.  200 cc of Cell Saver blood was returned to the patient.

## 2023-01-14 NOTE — Progress Notes (Signed)
Orthopedic Tech Progress Note Patient Details:  Katie Owens Nov 25, 1950 [id number]  Ortho Devices Type of Ortho Device: Lumbar corsett Ortho Device/Splint Interventions: Ordered      Bella Kennedy A Jarek Longton 01/14/2023, 7:11 PM

## 2023-01-14 NOTE — Interval H&P Note (Signed)
History and Physical Interval Note:  01/14/2023 12:35 PM  Katie Owens  has presented today for surgery, with the diagnosis of Spondylolisthesis, Lumbar region.  The various methods of treatment have been discussed with the patient and family. After consideration of risks, benefits and other options for treatment, the patient has consented to  Procedure(s) with comments: Posterior, Bilateral decompression, Fusion L2 to prev fusion L3-5 (Bilateral) - RM 21 to follow 3C as a surgical intervention.  The patient's history has been reviewed, patient examined, no change in status, stable for surgery.  I have reviewed the patient's chart and labs.  Questions were answered to the patient's satisfaction.     Stefani Dama

## 2023-01-14 NOTE — H&P (Signed)
Katie Owens is an 72 y.o. female.   Chief Complaint: Back and bilateral lower extremity pain in [name] to herniated nucleus pulposus L2-3 history of fusion L3 L5 HPI: Patient is a 72 year old individual whose had a previous decompression fusion L3-L5.  In the last 6 months she developed severe pain in the back and lower extremities and a recent MRI demonstrates the presence of a large central disc herniation at the level of L2-L3 above her previous fusion.  She also has spondylosis at L5-S1 but there is only mild stenosis there.  High-grade stenosis is noted at L2-L3 and she has been advised regarding the need for surgical decompression.  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   Dysrhythmia    hx AF had ablation in 2012, has occ pvc's per pt   Fatty liver    Fibromyalgia    GERD (gastroesophageal reflux disease)    History of kidney stones    passed on own in past per pt   Hyperlipemia    Hypertension    IBS (irritable bowel syndrome)    migraines 12/09/2020   with weather changes per pt   PONV (postoperative nausea and vomiting)    Sciatica 12/09/2020   right butt cheek and right leg and lower spine   Seasonal allergies    Shortness of breath 12/09/2020   on exertion   Sleep apnea    cpap broken waiting on new cpap, severe osa last sleep study home few months ago ( 12-09-2020 )   Uses walker 12/09/2020   left walker with friend out of the area   Wears glasses  12/09/2020    Past Surgical History:  Procedure Laterality Date   BREAST LUMPECTOMY WITH AXILLARY LYMPH NODE DISSECTION Left 07/02/1993   12 nodes removed   CARDIAC CATHETERIZATION  01/24/2009   left heart   CARDIAC ELECTROPHYSIOLOGY MAPPING AND ABLATION  07/02/2010   CARPAL TUNNEL RELEASE Right 10/14/2012   Procedure: CARPAL TUNNEL RELEASE;  Surgeon: Nicki Reaper, MD;  Location: [facility];  Service: Orthopedics;  Laterality: Right;   CHOLECYSTECTOMY N/A 12/15/2020   Procedure: LAPAROSCOPIC CHOLECYSTECTOMY WITH INTRAOPERATIVE CHOLANGIOGRAM, TAP BLOCK;  Surgeon: Karie Soda, MD;  Location: [facility];  Service: General;  Laterality: N/A;   COLONOSCOPY  2021   DIAGNOSTIC LAPAROSCOPY  07/02/1988   EP IMPLANTABLE DEVICE N/A 03/08/2016   Procedure: Loop Recorder Insertion;  Surgeon: Hillis Range, MD;  Location: [facility];  Service: Cardiovascular;  Laterality: N/A;   LIVER BIOPSY N/A 12/15/2020   Procedure: NEEDLE CORE LIVER BIOPSY;  Surgeon: Karie Soda, MD;  Location: [facility];  Service: General;  Laterality: N/A;   LUMBAR FUSION  2011   MASS EXCISION N/A 12/15/2020   Procedure: EXCISION MASS ABDOMINAL WALL;  Surgeon: Karie Soda, MD;  Location: [facility];  Service: General;  Laterality: N/A;   TONSILLECTOMY     age 66 per pt    Family History  Problem Relation Age of Onset   Stroke Mother    Hypertension Mother    Alcoholism Mother    Alcoholism Father    Alcoholism Brother    Hypertension Brother    Hyperlipidemia Brother    Anemia Brother    Other Brother        bleeding hemorroids   Heart disease Brother        valve surgery   Heart attack Maternal Grandmother        heart valve surgery   Hyperlipidemia Maternal Grandmother    Hypertension Maternal Grandmother    Social History:  reports that she quit smoking about 43 years ago. Her smoking use included cigarettes. She started smoking  about 58 years ago. She has a 30 pack-year smoking history. She has never used smokeless tobacco. She reports that she does not drink alcohol and does not use drugs.  Allergies:  Allergies  Allergen Reactions   Penicillins     Joints ache   Aspirin Hives and Rash   Ibuprofen Hives and Rash   Naproxen Hives and Rash   Tape Rash    Adhesive tape causes rashes. Paper tape is okay to use    No medications prior to admission.    No results found for this or any previous visit (from the past 48 hour(s)). No results found.  Review of Systems  Constitutional:  Positive for activity change.  Respiratory:  Positive for cough.   Gastrointestinal: Negative.   Musculoskeletal:  Positive for back pain and myalgias.  Neurological:  Positive for weakness and numbness.  All other systems reviewed and are negative.   There were no vitals taken for this visit. Physical Exam Constitutional:      Appearance: Normal appearance.  HENT:     Head: Normocephalic and atraumatic.     Right Ear: Tympanic membrane normal.     Left Ear: Tympanic membrane normal.     Nose: Nose normal.     Mouth/Throat:     Mouth: Mucous membranes are moist.  Eyes:     Extraocular Movements: Extraocular movements intact.     Conjunctiva/sclera: Conjunctivae normal.     Pupils: Pupils are equal, round, and reactive to light.  Cardiovascular:     Rate and Rhythm: Normal rate and regular rhythm.     Pulses: Normal pulses.     Heart sounds: Normal heart sounds.  Pulmonary:     Effort: Pulmonary effort is normal.     Breath sounds: Normal breath sounds.  Abdominal:     General: Abdomen is flat. Bowel sounds are normal.     Palpations: Abdomen is soft.  Musculoskeletal:        General: Normal range of motion.     Cervical back: Normal range of motion and neck supple.  Skin:    General: Skin is warm and dry.     Capillary Refill: Capillary refill takes less than 2 seconds.  Neurological:     Mental Status: She  is alert.     Comments: No weakness in iliopsoas bilaterally at 4- out of 5 absent patellar reflexes absent Achilles reflexes.  Sensation appears intact to pin and light touch in the upper extremities and lower extremities.  Psychiatric:        Mood and Affect: Mood normal.        Behavior: Behavior normal.  Thought Content: Thought content normal.        Judgment: Judgment normal.      Assessment/Plan Herniated nucleus pulposus with spondylosis L2-3 history of fusion L3-L5.  Plan: Decompression fusion L2-3  Stefani Dama, MD 01/14/2023, 8:08 AM

## 2023-01-14 NOTE — Anesthesia Procedure Notes (Signed)
Procedure Name: Intubation Date/Time: 01/14/2023 1:22 PM  Performed by: Rosiland Oz, CRNAPre-anesthesia Checklist: Patient identified, Emergency Drugs available, Suction available, Patient being monitored and Timeout performed Patient Re-evaluated:Patient Re-evaluated prior to induction Oxygen Delivery Method: Circle system utilized Preoxygenation: Pre-oxygenation with 100% oxygen Induction Type: IV induction Ventilation: Mask ventilation without difficulty Laryngoscope Size: Miller and 3 Grade View: Grade II Tube type: Oral Tube size: 7.0 mm Number of attempts: 1 Airway Equipment and Method: Stylet Placement Confirmation: ETT inserted through vocal cords under direct vision, positive ETCO2 and breath sounds checked- equal and bilateral Secured at: 21 cm Tube secured with: Tape Dental Injury: Teeth and Oropharynx as per pre-operative assessment

## 2023-01-14 NOTE — Transfer of Care (Signed)
Immediate Anesthesia Transfer of Care Note  Patient: Katie Owens  Procedure(s) Performed: Posterior, Bilateral Decompression, Fusion, Lumbar Two to Previous Fusion Lumbar Three- Lumbar Five (Bilateral: Back)  Patient Location: PACU  Anesthesia Type:General  Level of Consciousness: drowsy  Airway & Oxygen Therapy: Patient Spontanous Breathing and Patient connected to face mask oxygen  Post-op Assessment: Report given to RN and Post -op Vital signs reviewed and stable  Post vital signs: Reviewed and stable  Last Vitals:  Vitals Value Taken Time  BP 131/46 01/14/23 1703  Temp 98   Pulse 108 01/14/23 1709  Resp 17 01/14/23 1709  SpO2 100 % 01/14/23 1709  Vitals shown include unfiled device data.  Last Pain:  Vitals:   01/14/23 1049  TempSrc:   PainSc: 0-No pain      Patients Stated Pain Goal: 0 (01/14/23 1049)  Complications: No notable events documented.

## 2023-01-15 DIAGNOSIS — M5116 Intervertebral disc disorders with radiculopathy, lumbar region: Secondary | ICD-10-CM | POA: Diagnosis not present

## 2023-01-15 LAB — CBC
HCT: 31.2 % — ABNORMAL LOW (ref 36.0–46.0)
Hemoglobin: 9.9 g/dL — ABNORMAL LOW (ref 12.0–15.0)
MCH: 28.1 pg (ref 26.0–34.0)
MCHC: 31.7 g/dL (ref 30.0–36.0)
MCV: 88.6 fL (ref 80.0–100.0)
Platelets: 157 10*3/uL (ref 150–400)
RBC: 3.52 MIL/uL — ABNORMAL LOW (ref 3.87–5.11)
RDW: 13.1 % (ref 11.5–15.5)
WBC: 10.2 10*3/uL (ref 4.0–10.5)
nRBC: 0 % (ref 0.0–0.2)

## 2023-01-15 LAB — GLUCOSE, CAPILLARY
Glucose-Capillary: 214 mg/dL — ABNORMAL HIGH (ref 70–99)
Glucose-Capillary: 203 mg/dL — ABNORMAL HIGH (ref 70–99)

## 2023-01-15 LAB — BASIC METABOLIC PANEL
Anion gap: 10 (ref 5–15)
BUN: 23 mg/dL (ref 8–23)
CO2: 23 mmol/L (ref 22–32)
Calcium: 8.7 mg/dL — ABNORMAL LOW (ref 8.9–10.3)
Chloride: 97 mmol/L — ABNORMAL LOW (ref 98–111)
Creatinine, Ser: 1.42 mg/dL — ABNORMAL HIGH (ref 0.44–1.00)
GFR, Estimated: 39 mL/min — ABNORMAL LOW (ref >60)
Glucose, Bld: 356 mg/dL — ABNORMAL HIGH (ref 70–99)
Potassium: 4.5 mmol/L (ref 3.5–5.1)
Sodium: 130 mmol/L — ABNORMAL LOW (ref 135–145)

## 2023-01-15 MED ORDER — OXYCODONE HCL 5 MG PO TABS: 5.0000 mg | ORAL_TABLET | ORAL | 0 refills | Status: DC | PRN

## 2023-01-15 MED ORDER — METHOCARBAMOL 500 MG PO TABS: 500.0000 mg | ORAL_TABLET | Freq: Four times a day (QID) | ORAL | 2 refills | Status: AC | PRN

## 2023-01-15 MED FILL — Thrombin For Soln 5000 Unit: CUTANEOUS | Qty: 5000 | Status: AC

## 2023-01-15 NOTE — Anesthesia Postprocedure Evaluation (Signed)
Anesthesia Post Note  Patient: Katie Owens  Procedure(s) Performed: Posterior, Bilateral Decompression, Fusion, Lumbar Two to Previous Fusion Lumbar Three- Lumbar Five (Bilateral: Back)     Patient location during evaluation: PACU Anesthesia Type: General Level of consciousness: awake and alert Pain management: pain level controlled Vital Signs Assessment: post-procedure vital signs reviewed and stable Respiratory status: spontaneous breathing Cardiovascular status: stable Anesthetic complications: no   No notable events documented.  Last Vitals:  Vitals:   01/15/23 0723 01/15/23 0820  BP: (!) 121/57   Pulse: 98   Resp: 16   Temp: 36.6 C   SpO2: 99% 98%    Last Pain:  Vitals:   01/15/23 0432  TempSrc:   PainSc: 3                  Lewie Loron

## 2023-01-15 NOTE — Progress Notes (Signed)
OT NOTE OT NOTE  OT evaluation completed at this time. OT formal evaluation to follow. Pt currently adequate level for d/c home with friend ( karen) and recommendation for hhot / youth RW. Pt noted to have some cogntive deficits. Friend Clydie Braun reports that the pain medication causes confusion and she restricts pt to 4 pills per day to help with management of the confusion.   Mateo Flow   OTR/L Pager: [phone] Office: [phone]

## 2023-01-15 NOTE — Evaluation (Signed)
Physical Therapy Evaluation Patient Details Name: Katie Owens MRN: [id number] DOB: 1950/09/07 Today's Date: 01/15/2023  History of Present Illness  72 y.o. female presenting s/p Bilateral laminectomies decompression of L2-L3 (07/15). PMH includes: s/p L3-5 (2011)  fusion abdominal wall mass (06/16) CKD, DM II, GERD, HLD, HTN.   Clinical Impression  Prior to admission, patient reported needing assistance with functional mobility and ADLs. Patient uses rollator and occasional use of Quad cane for community ambulation. Pt also reports requiring some assistance from roommate for bathing and dressing prior to surgery. Currently patient demonstrated bed mobility and transfers without physical assistance from PT. Performed stairs and gait training with min guard for safety. Educated patient on sideways technique and energy conservation with navigating stairs. Remainder of session included pt education on car transfers, exercise progression and brace wear schedule. PT recommends continued skilled rehab in order to improve LE strength, activity tolerance and promote independence. No further acute PT needed.      Assistance Recommended at Discharge Set up Supervision/Assistance  If plan is discharge home, recommend the following:  Can travel by private vehicle  A little help with bathing/dressing/bathroom;Assist for transportation;Help with stairs or ramp for entrance;A little help with walking and/or transfers        Equipment Recommendations None recommended by PT  Recommendations for Other Services       Functional Status Assessment Patient has had a recent decline in their functional status and demonstrates the ability to make significant improvements in function in a reasonable and predictable amount of time.     Precautions / Restrictions Precautions Precautions: Back Precaution Booklet Issued: Yes (comment) Precaution Comments: Reviewed and verbalized throughout functional  mobility. Required Braces or Orthoses: Spinal Brace Spinal Brace: Lumbar corset;Applied in sitting position Restrictions Weight Bearing Restrictions: No      Mobility  Bed Mobility Overal bed mobility: Modified Independent             General bed mobility comments: Increased time, lowered HOB and bed rail to simulate home environment. Patient able to perform without physical assistance from PT. PT Provided verbal cue for LE to EOB. Maintained spinal precautions and demonstrated log roll technique.    Transfers Overall transfer level: Needs assistance Equipment used: Rolling walker (2 wheels) Transfers: Sit to/from Stand Sit to Stand: Supervision           General transfer comment: Close Supervision provided for safety. Patient able to transfer from EOB without physical assistance from PT. Cued for slight foward hinge for power up phase. Increased time needed.    Ambulation/Gait Ambulation/Gait assistance: Min guard Gait Distance (Feet): 120 Feet Assistive device: Rolling walker (2 wheels) Gait Pattern/deviations: Step-through pattern, Decreased step length - right, Decreased step length - left, Decreased stride length Gait velocity: decreased Gait velocity interpretation: <1.8 ft/sec, indicate of risk for recurrent falls   General Gait Details: Presented with decreased velocity and step through pattern. Cued for upright posture and RW proximity with good return.  Stairs Stairs: Yes Stairs assistance: Min guard Stair Management: One rail Right, Step to pattern, Sideways Number of Stairs: 5 General stair comments: Educated patient on various techniques; pt able to demonstrate sideways technique without physical assistance from PT. Min guard provided for safety.  Wheelchair Mobility     Tilt Bed    Modified Rankin (Stroke Patients Only)       Balance Overall balance assessment: Needs assistance Sitting-balance support: No upper extremity supported, Feet  supported Sitting balance-Leahy Scale: Good Sitting balance -  Comments: Patient able to donn spinal brace at EOB.   Standing balance support: Bilateral upper extremity supported, During functional activity Standing balance-Leahy Scale: Fair Standing balance comment: Able to maintain balance in static stance, use of RW with dynamic activities.                             Pertinent Vitals/Pain Pain Assessment Pain Assessment: 0-10 Pain Score: 1  Pain Location: Surgical Site Pain Descriptors / Indicators: Operative site guarding, Discomfort Pain Intervention(s): Limited activity within patient's tolerance, Monitored during session    Home Living Family/patient expects to be discharged to:: Private residence Living Arrangements: Non-relatives/Friends Available Help at Discharge: Friend(s);Available 24 hours/day Type of Home: Apartment Home Access: Stairs to enter Entrance Stairs-Rails: Can reach both Entrance Stairs-Number of Steps: 14 Total Step to Entry   Home Layout: One level Home Equipment: Agricultural consultant (2 wheels);Cane - quad      Prior Function Prior Level of Function : Needs assist       Physical Assist : Mobility (physical);ADLs (physical) Mobility (physical): Gait   Mobility Comments: Patient reported use of rollator and occasional use of Quad Cane for community ambulation ADLs Comments: Pt reported some asssitance with bathing and dressing from roomate.     Hand Dominance   Dominant Hand: Right    Extremity/Trunk Assessment   Upper Extremity Assessment Upper Extremity Assessment: Defer to OT evaluation    Lower Extremity Assessment Lower Extremity Assessment: Overall WFL for tasks assessed    Cervical / Trunk Assessment Cervical / Trunk Assessment: Back Surgery  Communication   Communication: No difficulties  Cognition Arousal/Alertness: Awake/alert Behavior During Therapy: WFL for tasks assessed/performed Overall Cognitive Status:  Within Functional Limits for tasks assessed                                 General Comments: A+Ox4, Pleasant througout session and able to follow one step commands consistently.        General Comments General comments (skin integrity, edema, etc.): VSS on RA.    Exercises     Assessment/Plan    PT Assessment All further PT needs can be met in the next venue of care  PT Problem List Decreased strength;Decreased range of motion;Decreased activity tolerance;Decreased balance;Decreased mobility;Decreased coordination;Decreased cognition       PT Treatment Interventions      PT Goals (Current goals can be found in the Care Plan section)  Acute Rehab PT Goals Patient Stated Goal: Patient would like to return home with reduced pain. PT Goal Formulation: All assessment and education complete, DC therapy    Frequency       Co-evaluation               AM-PAC PT "6 Clicks" Mobility  Outcome Measure Help needed turning from your back to your side while in a flat bed without using bedrails?: None Help needed moving from lying on your back to sitting on the side of a flat bed without using bedrails?: None Help needed moving to and from a bed to a chair (including a wheelchair)?: None Help needed standing up from a chair using your arms (e.g., wheelchair or bedside chair)?: A Little Help needed to walk in hospital room?: A Little Help needed climbing 3-5 steps with a railing? : A Little 6 Click Score: 21    End of Session Equipment Utilized During  Treatment: Gait belt;Back brace Activity Tolerance: Patient tolerated treatment well Patient left: in bed;with call bell/phone within reach Nurse Communication: Mobility status PT Visit Diagnosis: Other abnormalities of gait and mobility (R26.89);Difficulty in walking, not elsewhere classified (R26.2);Pain Pain - part of body:  (Lower Back)    Time: 7829-5621 PT Time Calculation (min) (ACUTE ONLY): 24  min   Charges:   PT Evaluation $PT Eval Low Complexity: 1 Low PT Treatments $Gait Training: 8-22 mins PT General Charges $$ ACUTE PT VISIT: 1 Visit         Christene Lye, SPT Acute Rehabilitation Services [phone] Secure chat preferred    Christene Lye 01/15/2023, 11:34 AM

## 2023-01-15 NOTE — TOC Transition Note (Signed)
Transition of Care [facility]) - CM/SW Discharge Note   Patient Details  Name: Katie Owens MRN: [id number] Date of Birth: Mar 04, 1951  Transition [facility]) CM/SW Contact:  Kermit Balo, RN Phone Number: 01/15/2023, 11:38 AM   Clinical Narrative:     Pt is s/p: Posterior, Bilateral Decompression, Fusion, Lumbar Two to Previous Fusion Lumbar Three- Lumbar Five. Home health ordered and referral sent to [facility]. Information on the AVS. Pt has used their services before and would like to use them again.  Pt has supervision at home and transportation to home.  Final next level of care: Home w Home Health Services Barriers to Discharge: No Barriers Identified   Patient Goals and CMS Choice CMS Medicare.gov Compare Post Acute Care list provided to:: Patient Choice offered to / list presented to : Patient  Discharge Placement                         Discharge Plan and Services Additional resources added to the After Visit Summary for                            [facility] Arranged: PT, OT [facility] Agency: [facility] Date [facility] Agency Contacted: 01/15/23   Representative spoke with at [facility] Agency: Haywood Lasso  Social Determinants of Health (SDOH) Interventions SDOH Screenings   Social Connections: Unknown (11/09/2022)   Received from [facility]  Tobacco Use: Medium Risk (01/14/2023)     Readmission Risk Interventions     No data to display

## 2023-01-15 NOTE — Discharge Instructions (Signed)
Wound Care REMOVE DRESSING IN 3 DAYS Leave incision open to air. You may shower. Do not scrub directly on incision.  Do not put any creams, lotions, or ointments on incision. Activity Walk each and every day, increasing distance each day. No lifting greater than 8 lbs.  Avoid bending, arching, and twisting. No driving for 2 weeks; may ride as a passenger locally. If provided with back brace, wear when out of bed.  It is not necessary to wear in bed. Diet Resume your normal diet.   Call Your Doctor If Any of These Occur Redness, drainage, or swelling at the wound.  Temperature greater than 101 degrees. Severe pain not relieved by pain medication. Incision starts to come apart. Follow Up Appt Call [phone])  for problems.  If you have any hardware placed in your spine, you will need an x-ray before your appointment.

## 2023-01-15 NOTE — Evaluation (Signed)
Occupational Therapy Evaluation Patient Details Name: Katie Owens MRN: [id number] DOB: 1951/05/03 Today's Date: 01/15/2023   History of Present Illness 72 y.o. female presenting s/p  Bilateral laminectomies decompression of L2-L3 (07/15). PMH includes: s/p L3-5 (2011)  fusion abdominal wall mass (06/16) CKD, DM II, GERD, HLD, HTN.   Clinical Impression   Patient evaluated by Occupational Therapy with no further acute OT needs identified. All education has been completed and the patient has no further questions. See below for any follow-up Occupational Therapy or equipment needs. OT to sign off. Thank you for referral.        Recommendations for follow up therapy are one component of a multi-disciplinary discharge planning process, led by the attending physician.  Recommendations may be updated based on patient status, additional functional criteria and insurance authorization.   Assistance Recommended at Discharge PRN  Patient can return home with the following Assist for transportation    Functional Status Assessment  Patient has had a recent decline in their functional status and demonstrates the ability to make significant improvements in function in a reasonable and predictable amount of time.  Equipment Recommendations  Other (comment) (youth RW)    Recommendations for Other Services       Precautions / Restrictions Precautions Precautions: Back Precaution Booklet Issued: Yes (comment) Precaution Comments: reviewed adls with back precautions Required Braces or Orthoses: Spinal Brace Spinal Brace: Lumbar corset;Applied in sitting position Restrictions Weight Bearing Restrictions: No      Mobility Bed Mobility Overal bed mobility: Modified Independent             General bed mobility comments: Increased time, lowered HOB and bed rail to simulate home environment. Patient able to perform without physical assistance from PT. PT Provided verbal cue for LE to EOB.  Maintained spinal precautions and demonstrated log roll technique.    Transfers Overall transfer level: Needs assistance Equipment used: Rolling walker (2 wheels) Transfers: Sit to/from Stand Sit to Stand: Min guard           General transfer comment: cues for placement in RW      Balance Overall balance assessment: Needs assistance Sitting-balance support: No upper extremity supported, Feet supported Sitting balance-Leahy Scale: Good Sitting balance - Comments: Patient able to donn spinal brace at EOB.   Standing balance support: Bilateral upper extremity supported, During functional activity Standing balance-Leahy Scale: Fair Standing balance comment: Able to maintain balance in static stance, use of RW with dynamic activities.                           ADL either performed or assessed with clinical judgement   ADL Overall ADL's : Needs assistance/impaired Eating/Feeding: Independent   Grooming: Wash/dry hands;Min guard;Standing   Upper Body Bathing: Min guard;Sitting   Lower Body Bathing: Min guard   Upper Body Dressing : Min guard Upper Body Dressing Details (indicate cue type and reason): able to don brace and doff brace Lower Body Dressing: Min guard;Sit to/from stand Lower Body Dressing Details (indicate cue type and reason): pt able to figure 4 cross at eob to don clothing for home Toilet Transfer: Min guard;Rolling walker (2 wheels)   Toileting- Clothing Manipulation and Hygiene: Min guard Toileting - Clothing Manipulation Details (indicate cue type and reason): educated on toilet aid and hellotushy commode adaptation to help with hygiene     Functional mobility during ADLs: Min guard;Rolling walker (2 wheels) General ADL Comments: friend present for [employer]  at this time  Back handout provided and reviewed adls in detail. Pt educated on: clothing between brace, never sleep in brace, set an alarm at night for medication, avoid sitting for long  periods of time, correct bed positioning for sleeping, correct sequence for bed mobility, avoiding lifting more than 5 pounds and never wash directly over incision. All education is complete and patient indicates understanding.    Vision Baseline Vision/History: 1 Wears glasses Ability to See in Adequate Light: 0 Adequate Patient Visual Report: No change from baseline Vision Assessment?: No apparent visual deficits     Perception     Praxis      Pertinent Vitals/Pain Pain Assessment Pain Assessment: 0-10 Pain Score: 1  Pain Location: Surgical Site Pain Descriptors / Indicators: Operative site guarding, Discomfort Pain Intervention(s): Premedicated before session, Monitored during session, Repositioned     Hand Dominance Right   Extremity/Trunk Assessment Upper Extremity Assessment Upper Extremity Assessment: Overall WFL for tasks assessed   Lower Extremity Assessment Lower Extremity Assessment: Defer to PT evaluation   Cervical / Trunk Assessment Cervical / Trunk Assessment: Back Surgery   Communication Communication Communication: No difficulties   Cognition Arousal/Alertness: Awake/alert, Suspect due to medications Behavior During Therapy: WFL for tasks assessed/performed Overall Cognitive Status: Impaired/Different from baseline Area of Impairment: Memory, Attention                   Current Attention Level: Selective Memory: Decreased short-term memory         General Comments: roommate present and reports that cognition is impaired with pain medications and need to limit to 4 pills per day to help the patient function in adls.     General Comments  RA    Exercises     Shoulder Instructions      Home Living Family/patient expects to be discharged to:: Private residence Living Arrangements: Non-relatives/Friends Available Help at Discharge: Friend(s);Available 24 hours/day (friend name Clydie Braun) Type of Home: Apartment Home Access: Stairs to  enter [employer] of Steps: 14 Total Step to Entry Entrance Stairs-Rails: Can reach both Home Layout: One level     Bathroom Shower/Tub: Chief Strategy Officer: Standard     Home Equipment: Agricultural consultant (2 wheels);Cane - quad;BSC/3in1;Adaptive equipment Adaptive Equipment: Reacher Additional Comments: cat named [place]      Prior Functioning/Environment Prior Level of Function : Needs assist       Physical Assist : Mobility (physical);ADLs (physical) Mobility (physical): Gait   Mobility Comments: Patient reported use of rollator and occasional use of Quad Cane for community ambulation ADLs Comments: Pt reported some asssitance with bathing and dressing from roomate.        OT Problem List:        OT Treatment/Interventions:      OT Goals(Current goals can be found in the care plan section)    OT Frequency:      Co-evaluation              AM-PAC OT "6 Clicks" Daily Activity     Outcome Measure Help from another person eating meals?: None Help from another person taking care of personal grooming?: None Help from another person toileting, which includes using toliet, bedpan, or urinal?: None Help from another person bathing (including washing, rinsing, drying)?: A Little Help from another person to put on and taking off regular upper body clothing?: None Help from another person to put on and taking off regular lower body clothing?: None 6 Click Score: 23  End of Session Equipment Utilized During Treatment: Rolling walker (2 wheels);Back brace Nurse Communication: Mobility status  Activity Tolerance: Patient tolerated treatment well Patient left: in bed;with call bell/phone within reach;with family/visitor present  OT Visit Diagnosis: Unsteadiness on feet (R26.81)                Time: 1610-9604 OT Time Calculation (min): 33 min Charges:  OT General Charges $OT Visit: 1 Visit OT Evaluation $OT Eval Moderate Complexity: 1  Mod OT Treatments $Self Care/Home Management : 8-22 mins   Brynn, OTR/L  Acute Rehabilitation Services Office: [phone] .   Mateo Flow 01/15/2023, 4:37 PM

## 2023-01-15 NOTE — Discharge Summary (Signed)
Physician Discharge Summary  Patient ID: Katie Owens MRN: [id number] DOB/AGE: 04-Nov-1950 72 y.o.  Admit date: 01/14/2023 Discharge date: 01/15/2023  Admission Diagnoses: Lumbar stenosis L2-L3.  Herniated nucleus pulposus L2-L3.  Lumbar radiculopathy with iliopsoas weakness.  Diabetes mellitus.  History of fusion L3-L5. Discharge Diagnoses: Lumbar stenosis L2-L3.  Herniated nucleus pulposus L2-L3.  Lumbar radiculopathy with iliopsoas weakness.  History of fusion L3-L5.  Diabetes mellitus. Principal Problem:   Herniated nucleus pulposus, L2-3 left   Discharged Condition: fair  Hospital Course: Patient was admitted to undergo surgical decompression arthrodesis at L2-L3.  She is doing well and incision is clean and dry.  Patient does have diabetes and her blood sugar has been increased secondary to her not being on Trulicity.  She will restart this medication at home.  She will have physical therapy at home including Occupational Therapy to help with mobile debility secondary to proximal leg weakness  Consults: None  Significant Diagnostic Studies: None  Treatments: See op note  Discharge Exam: Blood pressure (!) 121/57, pulse 98, temperature 97.9 F (36.6 C), resp. rate 16, height 4\' 11"  (1.499 m), weight 95.6 kg, SpO2 98%. Incision is clean and dry motor function is intact.  Disposition: Discharge disposition: 01-Home or Self Care       Discharge Instructions     Call MD for:  redness, tenderness, or signs of infection (pain, swelling, redness, odor or green/yellow discharge around incision site)   Complete by: As directed    Call MD for:  severe uncontrolled pain   Complete by: As directed    Call MD for:  temperature >100.4   Complete by: As directed    Diet - low sodium heart healthy   Complete by: As directed    Discharge instructions   Complete by: As directed    Remove dressing on back in 2 days.  Patient may shower.  Do not apply salves or lotions to incision.    Incentive spirometry RT   Complete by: As directed    Increase activity slowly   Complete by: As directed       Allergies as of 01/15/2023       Reactions   Penicillins    Joints ache   Aspirin Hives, Rash   Ibuprofen Hives, Rash   Naproxen Hives, Rash   Tape Rash   Adhesive tape causes rashes. Paper tape is okay to use        Medication List     TAKE these medications    ALPRAZolam 0.5 MG tablet Commonly known as: XANAX Take 0.5 mg by mouth at bedtime as needed for sleep or anxiety.   ARIPiprazole 2 MG tablet Commonly known as: ABILIFY Take 2 mg by mouth every morning.   b complex vitamins tablet Take 1 tablet by mouth daily.   beclomethasone 80 MCG/ACT inhaler Commonly known as: QVAR Inhale 1 puff into the lungs 2 (two) times daily as needed (shortness of breath).   Biotin 5 MG Caps Take 5 mg by mouth daily.   Blood Glucose Monitoring Suppl Devi 1 each by Does not apply route in the morning, at noon, and at bedtime. May substitute to any manufacturer covered by patient's insurance.   BLOOD GLUCOSE TEST STRIPS Strp 1 each by In Vitro route in the morning, at noon, and at bedtime. May substitute to any manufacturer covered by patient's insurance.   carvedilol 25 MG tablet Commonly known as: COREG Take 1 tablet (25 mg total) by mouth 2 (two) times  daily.   celecoxib 200 MG capsule Commonly known as: CELEBREX Take 200 mg by mouth 2 (two) times daily.   chlorthalidone 25 MG tablet Commonly known as: HYGROTON Take 25 mg by mouth daily.   CITRACAL PO Take 2 tablets by mouth daily.   cyclobenzaprine 10 MG tablet Commonly known as: FLEXERIL Take 10 mg by mouth at bedtime as needed for muscle spasms.   dicyclomine 20 MG tablet Commonly known as: BENTYL Take 20 mg by mouth every 8 (eight) hours as needed for spasms.   diltiazem 30 MG tablet Commonly known as: CARDIZEM Take 30 mg by mouth every 4 (four) hours as needed (for HR>100 and BP>100).    diphenhydrAMINE 25 MG tablet Commonly known as: BENADRYL Take 25 mg by mouth every 6 (six) hours as needed for allergies.   docusate sodium 100 MG capsule Commonly known as: COLACE Take 1 capsule (100 mg total) by mouth 2 (two) times daily.   DULoxetine 60 MG capsule Commonly known as: CYMBALTA Take 60 mg by mouth daily.   glipiZIDE 5 MG 24 hr tablet Commonly known as: GLUCOTROL XL Take 1 tablet (5 mg total) by mouth daily with breakfast.   loperamide 2 MG capsule Commonly known as: IMODIUM Take 2-4 mg by mouth as needed for diarrhea or loose stools.   magnesium oxide 400 (240 Mg) MG tablet Commonly known as: MAG-OX TAKE 1 TABLET BY MOUTH EVERY DAY   methocarbamol 500 MG tablet Commonly known as: ROBAXIN Take 1 tablet (500 mg total) by mouth every 6 (six) hours as needed for muscle spasms.   MULTIVITAMIN ADULT PO Take 1 tablet by mouth daily.   omeprazole 20 MG capsule Commonly known as: PRILOSEC Take 20 mg by mouth daily.   oxyCODONE 5 MG immediate release tablet Commonly known as: Oxy IR/ROXICODONE Take 1 tablet (5 mg total) by mouth every 4 (four) hours as needed for moderate pain. What changed:  when to take this reasons to take this   potassium chloride SA 20 MEQ tablet Commonly known as: KLOR-CON M Take 20 mEq by mouth daily.   pregabalin 75 MG capsule Commonly known as: LYRICA Take 75 mg by mouth 2 (two) times daily.   rosuvastatin 20 MG tablet Commonly known as: CRESTOR Take 20 mg by mouth daily after supper.   SUMAtriptan 100 MG tablet Commonly known as: IMITREX Take 100 mg by mouth as needed for migraine.   telmisartan 80 MG tablet Commonly known as: MICARDIS Take 80 mg by mouth daily.   traMADol 50 MG tablet Commonly known as: ULTRAM Take 1-2 tablets (50-100 mg total) by mouth every 6 (six) hours as needed for moderate pain or severe pain.   Trulicity 1.5 MG/0.5ML Sopn Generic drug: Dulaglutide Inject 1.5 mg into the skin once a week.    Ventolin HFA 108 (90 Base) MCG/ACT inhaler Generic drug: albuterol Inhale 1-2 puffs into the lungs every 6 (six) hours as needed for wheezing or shortness of breath.   Vitamin D 50 MCG (2000 UT) tablet Take 2,000 Units by mouth daily.         Signed: Stefani Dama 01/15/2023, 8:51 AM

## 2023-01-15 NOTE — Progress Notes (Addendum)
Patient alert and oriented, mae's well, voiding adequate amount of urine, swallowing without difficulty, no c/o pain at time of discharge. Patient discharged home with friends. Script and discharged instructions given to patient. Patient and friend stated understanding of instructions given. Patient has an appointment with Dr. Danielle Dess

## 2023-01-16 NOTE — Telephone Encounter (Signed)
Called pt no answer, lvmm for pt to give the office a call back. Pt has not been seen since '22 and will need a ov

## 2023-01-22 MED FILL — Sodium Chloride IV Soln 0.9%: INTRAVENOUS | Qty: 2000 | Status: AC

## 2023-01-22 MED FILL — Heparin Sodium (Porcine) Inj 1000 Unit/ML: INTRAMUSCULAR | Qty: 30 | Status: AC

## 2023-01-22 MED FILL — Sodium Chloride Irrigation Soln 0.9%: Qty: 3000 | Status: AC

## 2023-01-30 ENCOUNTER — Encounter (HOSPITAL_BASED_OUTPATIENT_CLINIC_OR_DEPARTMENT_OTHER): Payer: Self-pay

## 2023-01-30 NOTE — Telephone Encounter (Signed)
Mychart message sent to patient stating that an appointment will be needed.

## 2023-02-05 ENCOUNTER — Ambulatory Visit: Payer: Medicare HMO | Admitting: Cardiovascular Disease

## 2023-02-05 VITALS — BP 136/68 | HR 93 | Ht 59.0 in | Wt 213.8 lb

## 2023-02-05 DIAGNOSIS — I4891 Unspecified atrial fibrillation: Secondary | ICD-10-CM | POA: Diagnosis not present

## 2023-02-05 NOTE — Patient Instructions (Signed)
Medication Instructions:  Your physician recommends that you continue on your current medications as directed. Please refer to the Current Medication list given to you today. *If you need a refill on your cardiac medications before your next appointment, please call your pharmacy*   Follow-Up: At [facility], you and your health needs are our priority.  As part of our continuing mission to provide you with exceptional heart care, we have created designated Provider Care Teams.  These Care Teams include your primary Cardiologist (physician) and Advanced Practice Providers (APPs -  Physician Assistants and Nurse Practitioners) who all work together to provide you with the care you need, when you need it.  We recommend signing up for the patient portal called "MyChart".  Sign up information is provided on this After Visit Summary.  MyChart is used to connect with patients for Virtual Visits (Telemedicine).  Patients are able to view lab/test results, encounter notes, upcoming appointments, etc.  Non-urgent messages can be sent to your provider as well.   To learn more about what you can do with MyChart, go to [email or web address].    Your next appointment:   1 year(s)  Provider:   You will follow up in the Atrial Fibrillation Clinic located at [facility]. Your provider will be: Rudi Coco, NP or Clint R. Fenton, PA-C

## 2023-02-05 NOTE — Progress Notes (Signed)
  Electrophysiology Office Note:    Date:  02/05/2023   ID:  Katie Owens, DOB December 06, 1950, MRN [id number]  PCP:  Mosetta Putt, MD   [facility] Providers Cardiologist:  Hillis Range, MD (Inactive)     Referring MD: Mosetta Putt, MD   History of Present Illness:    Katie Owens is a 72 y.o. female with a medical history significant for atrial fibrillation by Dr. Prentice Docker at [facility] in 2012, sleep apnea with CPAP who presents for electrophysiology follow-up.     The patient saw Dr. Johney Frame in spring 2018 at which time an ILR was placed for evaluation of palpitations.  PVCs and brief A. tach were detected, but no atrial fibrillation, so Xarelto was stopped.  She continued to follow-up over the years with recurrent palpitations associated with PVCs but no detection of sustained atrial fibrillation.  Her device reached EOL in 2021.  The patient was last seen in our clinic in March 2022.  At that time she was stable with no significant change in symptoms.  Records from Dr. Duaine Dredge reviewed under the media tab.  The patient has not had recurrence of her atrial fibrillation symptoms since her last clinic visit.  However, since April, she has had about 5 episodes of syncope or near syncope.  1 of these occurred in the setting of pain from spine surgery, the others, she reports, occurred shortly after standing up.  Her chlorthalidone was discontinued, and her symptoms have essentially resolved.      Today, she reports that she is doing very well.  She has not had any lightheadedness since discontinuing chlorthalidone. No palpitations.  EKGs/Labs/Other Studies Reviewed Today:    Echocardiogram:  TTE October 03, 2020 EF 50 to 55%.  Normal RV function.  Normal left atrial size.   Monitors:   Stress testing:   Advanced imaging:   Cardiac catherization    EKG:         Physical Exam:    VS:  BP 136/68   Pulse 93   Ht 4\' 11"  (1.499 m)   Wt 213 lb 12.8 oz (97  kg)   SpO2 99%   BMI 43.18 kg/m     Wt Readings from Last 3 Encounters:  02/05/23 213 lb 12.8 oz (97 kg)  01/14/23 210 lb 12.8 oz (95.6 kg)  01/08/23 210 lb 12.8 oz (95.6 kg)     GEN: Well nourished, well developed in no acute distress CARDIAC: RRR, no murmurs, rubs, gallops RESPIRATORY:  Normal work of breathing MUSCULOSKELETAL: no edema    ASSESSMENT & PLAN:    Near syncope About 5 episodes in the past 3 months Episodes associated with vagal trigger (pain) or standing up Low suspicion for arrhythmia Symptoms essentially resolved off of chlorthalidone  Persistent atrial fibrillation Symptomatic with palpitations No recurrence since ablation at [facility] in 2012 -by ILR 2018-2021 or symptoms  Obstructive sleep apnea Compliance with CPAP encouraged  Palpitations Historically, have correlated with PVCs   Follow-up 1 year   Signed, Maurice Small, MD  02/05/2023 2:33 PM    [facility]

## 2023-02-07 ENCOUNTER — Other Ambulatory Visit: Payer: Self-pay | Admitting: "Endocrinology

## 2023-02-07 DIAGNOSIS — E1165 Type 2 diabetes mellitus with hyperglycemia: Secondary | ICD-10-CM

## 2023-02-26 ENCOUNTER — Ambulatory Visit: Payer: Medicare HMO | Admitting: Dietician

## 2023-03-10 ENCOUNTER — Other Ambulatory Visit: Payer: Self-pay | Admitting: "Endocrinology

## 2023-03-10 DIAGNOSIS — E1165 Type 2 diabetes mellitus with hyperglycemia: Secondary | ICD-10-CM

## 2023-03-18 ENCOUNTER — Ambulatory Visit: Payer: Medicare HMO | Admitting: Dietician

## 2023-03-19 ENCOUNTER — Ambulatory Visit (HOSPITAL_BASED_OUTPATIENT_CLINIC_OR_DEPARTMENT_OTHER): Payer: Medicare HMO | Admitting: Pulmonary Disease

## 2023-03-19 ENCOUNTER — Encounter (HOSPITAL_BASED_OUTPATIENT_CLINIC_OR_DEPARTMENT_OTHER): Payer: Self-pay | Admitting: Pulmonary Disease

## 2023-03-19 VITALS — BP 132/62 | HR 89 | Ht 59.0 in | Wt 215.0 lb

## 2023-03-19 DIAGNOSIS — G4733 Obstructive sleep apnea (adult) (pediatric): Secondary | ICD-10-CM | POA: Diagnosis not present

## 2023-03-19 DIAGNOSIS — R053 Chronic cough: Secondary | ICD-10-CM | POA: Diagnosis not present

## 2023-03-19 NOTE — Assessment & Plan Note (Signed)
We discussed problems with her humidifier.  She will contact DME and use a replacement if necessary. I encouraged her to get back on her CPAP machine. Weight loss encouraged, compliance with goal of at least 4-6 hrs every night is the expectation. Advised against medications with sedative side effects Cautioned against driving when sleepy - understanding that sleepiness will vary on a day to day basis

## 2023-03-19 NOTE — Patient Instructions (Signed)
Please get back on your CPAP machine. Try using without humidifier. Check with DME if they have any ideas or we can provide you with a replacement humidifier when you are eligible  Trial of chlorpheniramine 4 mg at bedtime x 3 weeks + phenylephrine 10 mg daytime  x 7 days for cough (combination [employer] brand 'sinus PE')

## 2023-03-19 NOTE — Progress Notes (Addendum)
Subjective:    Patient ID: Katie Owens, female    DOB: 08/25/1950, 72 y.o.   MRN: [id number]  HPI  72 yo retiree for FU of OSA & delayed sleep phase syndrome PMH -atrial fibrillation p-ablation, mild intermittent asthma, hypertension, breast cancer C-spine hardware  2-year follow-up visit.  She underwent back surgery few months ago and has stopped using her CPAP machine since March.  She reports residue on her humidifier.  She replaced this and even the new 1 had a similar problem she wonders if something is wrong with her CPAP machine. She also reports an occasional daily cough especially when she breathes.  Cold air, she develops paroxysms and a tickle in her throat. PCP gave her Qvar which she admits to not using and PCP told she can stop using. He also gave her albuterol MDI which he used with a spacer but this made her cough worse.  CT angiogram chest 12/2022 was reviewed which did not show any lung pathology, liver appeared nodular  CPAP download shows good compliance in February and March on auto settings 10 to 16 cm with average pressure of 13 and maximum pressure of 14 cm with no residual events and mild leak Significant tests/ events reviewed   07/2020 HST AHI 19/hour, lowest saturation 78% PSG 05/01/15 >> AHI 30.2, SpO2 low 86%   05/2009 CPAP titration-266 pounds-16 cm Previous sleep study showed AHI of 28  Review of Systems neg for any significant sore throat, dysphagia, itching, sneezing, nasal congestion or excess/ purulent secretions, fever, chills, sweats, unintended wt loss, pleuritic or exertional cp, hempoptysis, orthopnea pnd or change in chronic leg swelling. Also denies presyncope, palpitations, heartburn, abdominal pain, nausea, vomiting, diarrhea or change in bowel or urinary habits, dysuria,hematuria, rash, arthralgias, visual complaints, headache, numbness weakness or ataxia.     Objective:   Physical Exam  Gen. Pleasant, obese, in no distress ENT -  no lesions, no post nasal drip Neck: No JVD, no thyromegaly, no carotid bruits Lungs: no use of accessory muscles, no dullness to percussion, decreased without rales or rhonchi  Cardiovascular: Rhythm regular, heart sounds  normal, no murmurs or gallops, no peripheral edema Musculoskeletal: No deformities, no cyanosis or clubbing , no tremors       Assessment & Plan:

## 2023-03-19 NOTE — Assessment & Plan Note (Signed)
Considering usual suspects of postnasal drip and reflux  Trial of chlorpheniramine 4 mg at bedtime x 3 weeks + phenylephrine 10 mg daytime  (combination [employer] brand 'sinus PE')

## 2023-04-15 ENCOUNTER — Encounter: Payer: Self-pay | Admitting: "Endocrinology

## 2023-04-15 ENCOUNTER — Ambulatory Visit: Payer: Medicare HMO | Admitting: "Endocrinology

## 2023-04-15 VITALS — BP 123/50 | HR 96 | Ht 59.0 in | Wt 222.8 lb

## 2023-04-15 DIAGNOSIS — Z794 Long term (current) use of insulin: Secondary | ICD-10-CM | POA: Diagnosis not present

## 2023-04-15 DIAGNOSIS — E782 Mixed hyperlipidemia: Secondary | ICD-10-CM | POA: Diagnosis not present

## 2023-04-15 DIAGNOSIS — Z7985 Long-term (current) use of injectable non-insulin antidiabetic drugs: Secondary | ICD-10-CM | POA: Diagnosis not present

## 2023-04-15 DIAGNOSIS — Z7984 Long term (current) use of oral hypoglycemic drugs: Secondary | ICD-10-CM | POA: Diagnosis not present

## 2023-04-15 DIAGNOSIS — E1165 Type 2 diabetes mellitus with hyperglycemia: Secondary | ICD-10-CM

## 2023-04-15 LAB — POCT GLYCOSYLATED HEMOGLOBIN (HGB A1C): Hemoglobin A1C: 6.3 % — AB (ref 4.0–5.6)

## 2023-04-15 MED ORDER — EMPAGLIFLOZIN 10 MG PO TABS: 10.0000 mg | ORAL_TABLET | Freq: Every day | ORAL | 3 refills | Status: DC

## 2023-04-15 NOTE — Patient Instructions (Signed)

## 2023-04-15 NOTE — Progress Notes (Signed)
Outpatient Endocrinology Note Katie Orcutt, MD  04/15/23   Katie Owens Jan 12, 1951 [id number]  Referring Provider: Mosetta Putt, MD Primary Care Provider: Mosetta Putt, MD Reason for consultation: Subjective   Assessment & Plan  Diagnoses and all orders for this visit:  Type 2 diabetes mellitus with hyperglycemia, with long-term current use of insulin (HCC) -     POCT glycosylated hemoglobin (Hb A1C)  Long term (current) use of oral hypoglycemic drugs  Long-term (current) use of injectable non-insulin antidiabetic drugs  Mixed hypercholesterolemia and hypertriglyceridemia  Other orders -     empagliflozin (JARDIANCE) 10 MG TABS tablet; Take 1 tablet (10 mg total) by mouth daily before breakfast.    Diabetes complicated by neuropathy, nephropathy  Hba1c goal less than 7.0, current Hba1c is 6.3%. Will recommend for the following change of medications to: Stopped metformin XR 500 mg twice daily due to renal dysfunction  On Trulicity 1.5 mg weekly (no weight loss, if continued the same response will replace it with mounjaro in future) Replace glipizide XL 5 mg daily with jardiance 10 mg every day  Patient previously tolerated Ozempic well, unable to afford now  No known contraindications to any of above medications No history of MEN syndrome/medullary thyroid cancer/pancreatitis or pancreatic cancer in self or family Discussed S/E of jardiance  Hyperlipidemia -Last LDL off goal: 90 on rosuvastatin 10 mg every day -Continue rosuvastatin 20 mg every day -Follow low fat diet and exercise    -Blood pressure goal <140/90 - Microalbumin/creatinine goal < 30, GFR low - on ACE/ARB Telmisartan 80 mg qd: defer to nephrology  -diet changes including salt restriction -limit eating outside -counseled BP targets per standards of diabetes care -Uncontrolled blood pressure can lead to retinopathy, nephropathy and cardiovascular and atherosclerotic heart  disease  Reviewed and counseled on: -A1C target -Blood sugar targets -Complications of uncontrolled diabetes  -Checking blood sugar before meals and bedtime and bring log next visit -All medications with mechanism of action and side effects -Hypoglycemia management: rule of 15's, Glucagon Emergency Kit and medical alert ID -low-carb low-fat plate-method diet -At least 20 minutes of physical activity per day -Annual dilated retinal eye exam and foot exam -compliance and follow up needs -follow up as scheduled or earlier if problem gets worse  Call if blood sugar is less than 70 or consistently above 250    Take a 15 gm snack of carbohydrate at bedtime before you go to sleep if your blood sugar is less than 100.    If you are going to fast after midnight for a test or procedure, ask your physician for instructions on how to reduce/decrease your insulin dose.    Call if blood sugar is less than 70 or consistently above 250  -Treating a low sugar by rule of 15  (15 gms of sugar every 15 min until sugar is more than 70) If you feel your sugar is low, test your sugar to be sure If your sugar is low (less than 70), then take 15 grams of a fast acting Carbohydrate (3-4 glucose tablets or glucose gel or 4 ounces of juice or regular soda) Recheck your sugar 15 min after treating low to make sure it is more than 70 If sugar is still less than 70, treat again with 15 grams of carbohydrate          Don't drive the hour of hypoglycemia  If unconscious/unable to eat or drink by mouth, use glucagon injection or nasal spray  baqsimi and call 911. Can repeat again in 15 min if still unconscious.  Return in about 3 months (around 07/16/2023).   I have reviewed current medications, nurse's notes, allergies, vital signs, past medical and surgical history, family medical history, and social history for this encounter. Counseled patient on symptoms, examination findings, lab findings, imaging results,  treatment decisions and monitoring and prognosis. The patient understood the recommendations and agrees with the treatment plan. All questions regarding treatment plan were fully answered.  Katie [place], MD  04/15/23    History of Present Illness Katie Owens is a 72 y.o. year old female who presents for follow up of Type 2 diabetes mellitus.  Katie Owens was first diagnosed in 2014.   Diabetes education +  Home diabetes regimen: Trulicity 1.5 mg weekly Glipizide XL 5 mg daily   Stopped Metformin ER 500 mg bid due to renal dysfunction (1000 bid lead to diarrhea)  Cannot afford ozempic - no issues   COMPLICATIONS -  MI/Stroke -  retinopathy +  neuropathy +  nephropathy  SYMPTOMS REVIEWED - Polyuria - Weight loss - Blurred vision  BLOOD SUGAR DATA Per meter, checks 2-3 times a day Well controlled BG  11/22/2022 AST 36 ALT 25 Cr 1.2 GFR 47 LDL 31 Tg 124    Physical Exam  BP (!) 123/50   Pulse 96   Ht 4\' 11"  (1.499 m)   Wt 222 lb 12.8 oz (101.1 kg)   SpO2 97%   BMI 45.00 kg/m    Constitutional: well developed, well nourished Head: normocephalic, atraumatic Eyes: sclera anicteric, no redness Neck: supple Lungs: normal respiratory effort Neurology: alert and oriented Skin: dry, no appreciable rashes Musculoskeletal: no appreciable defects Psychiatric: normal mood and affect Diabetic Foot Exam - Simple   No data filed      Current Medications Patient's Medications  New Prescriptions   EMPAGLIFLOZIN (JARDIANCE) 10 MG TABS TABLET    Take 1 tablet (10 mg total) by mouth daily before breakfast.  Previous Medications   ALPRAZOLAM (XANAX) 0.5 MG TABLET    Take 0.5 mg by mouth at bedtime as needed for sleep or anxiety.   ARIPIPRAZOLE (ABILIFY) 2 MG TABLET    Take 2 mg by mouth every morning.   B COMPLEX VITAMINS TABLET    Take 1 tablet by mouth daily.   BIOTIN 5 MG CAPS    Take 5 mg by mouth daily.   BLOOD GLUCOSE MONITORING SUPPL DEVI    1 each by  Does not apply route in the morning, at noon, and at bedtime. May substitute to any manufacturer covered by patient's insurance.   CALCIUM CITRATE (CITRACAL PO)    Take 2 tablets by mouth daily.   CARVEDILOL (COREG) 25 MG TABLET    Take 1 tablet (25 mg total) by mouth 2 (two) times daily.   CELECOXIB (CELEBREX) 200 MG CAPSULE    Take 200 mg by mouth 2 (two) times daily.   CHLORTHALIDONE (HYGROTON) 25 MG TABLET    Take 25 mg by mouth daily.   CHOLECALCIFEROL (VITAMIN D) 50 MCG (2000 UT) TABLET    Take 2,000 Units by mouth daily.    CYCLOBENZAPRINE (FLEXERIL) 10 MG TABLET    Take 10 mg by mouth at bedtime as needed for muscle spasms.   DICYCLOMINE (BENTYL) 20 MG TABLET    Take 20 mg by mouth every 8 (eight) hours as needed for spasms.   DILTIAZEM (CARDIZEM) 30 MG TABLET    Take 30  mg by mouth every 4 (four) hours as needed (for HR>100 and BP>100).   DIPHENHYDRAMINE (BENADRYL) 25 MG TABLET    Take 25 mg by mouth every 6 (six) hours as needed for allergies.   DULAGLUTIDE (TRULICITY) 1.5 MG/0.5ML SOPN    INJECT 1.5 MG INTO THE SKIN ONCE A WEEK.   DULOXETINE (CYMBALTA) 60 MG CAPSULE    Take 60 mg by mouth daily.   GLIPIZIDE (GLUCOTROL XL) 5 MG 24 HR TABLET    Take 1 tablet (5 mg total) by mouth daily with breakfast.   LOPERAMIDE (IMODIUM) 2 MG CAPSULE    Take 2-4 mg by mouth as needed for diarrhea or loose stools.   MAGNESIUM OXIDE (MAG-OX) 400 (240 MG) MG TABLET    TAKE 1 TABLET BY MOUTH EVERY DAY   METHOCARBAMOL (ROBAXIN) 500 MG TABLET    Take 1 tablet (500 mg total) by mouth every 6 (six) hours as needed for muscle spasms.   MULTIPLE VITAMINS-MINERALS (MULTIVITAMIN ADULT PO)    Take 1 tablet by mouth daily.   OMEPRAZOLE (PRILOSEC) 20 MG CAPSULE    Take 20 mg by mouth daily.   OXYCODONE (OXY IR/ROXICODONE) 5 MG IMMEDIATE RELEASE TABLET    Take 1 tablet (5 mg total) by mouth every 4 (four) hours as needed for moderate pain.   PREGABALIN (LYRICA) 75 MG CAPSULE    Take 75 mg by mouth 3 (three) times  daily.   ROSUVASTATIN (CRESTOR) 20 MG TABLET    Take 20 mg by mouth daily after supper.   SUMATRIPTAN (IMITREX) 100 MG TABLET    Take 100 mg by mouth as needed for migraine.   TELMISARTAN (MICARDIS) 80 MG TABLET    Take 80 mg by mouth daily.   TRAMADOL (ULTRAM) 50 MG TABLET    Take 1-2 tablets (50-100 mg total) by mouth every 6 (six) hours as needed for moderate pain or severe pain.   VENTOLIN HFA 108 (90 BASE) MCG/ACT INHALER    Inhale 1-2 puffs into the lungs every 6 (six) hours as needed for wheezing or shortness of breath.   Modified Medications   No medications on file  Discontinued Medications   No medications on file    Allergies Allergies  Allergen Reactions   Penicillins     Joints ache   Aspirin Hives and Rash   Ibuprofen Hives and Rash   Naproxen Hives and Rash   Tape Rash    Adhesive tape causes rashes. Paper tape is okay to use    Past Medical History 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   Dysrhythmia    hx AF had ablation in 2012, has occ pvc's per pt   Fatty liver    Fibromyalgia    GERD (gastroesophageal reflux disease)    History of kidney stones    passed on own in past per pt  Hyperlipemia    Hypertension    IBS (irritable bowel syndrome)    migraines 12/09/2020   with weather changes per pt   PONV (postoperative nausea and vomiting)    Sciatica 12/09/2020   right butt cheek and right leg and lower spine   Seasonal allergies     Shortness of breath 12/09/2020   on exertion   Sleep apnea    cpap broken waiting on new cpap, severe osa last sleep study home few months ago ( 12-09-2020 )   Uses walker 12/09/2020   left walker with friend out of the area   Wears glasses 12/09/2020    Past Surgical History Past Surgical History:  Procedure Laterality Date   BREAST LUMPECTOMY WITH AXILLARY LYMPH NODE DISSECTION Left 07/02/1993   12 nodes removed   CARDIAC CATHETERIZATION  01/24/2009   left heart   CARDIAC ELECTROPHYSIOLOGY MAPPING AND ABLATION  07/02/2010   CARPAL TUNNEL RELEASE Right 10/14/2012   Procedure: CARPAL TUNNEL RELEASE;  Surgeon: Nicki Reaper, MD;  Location: [facility];  Service: Orthopedics;  Laterality: Right;   CHOLECYSTECTOMY N/A 12/15/2020   Procedure: LAPAROSCOPIC CHOLECYSTECTOMY WITH INTRAOPERATIVE CHOLANGIOGRAM, TAP BLOCK;  Surgeon: Karie Soda, MD;  Location[facility];  Service: General;  Laterality: N/A;   COLONOSCOPY  2021   DIAGNOSTIC LAPAROSCOPY  07/02/1988   EP IMPLANTABLE DEVICE N/A 03/08/2016   Procedure: Loop Recorder Insertion;  Surgeon: Hillis Range, MD;  Location: [facility];  Service: Cardiovascular;  Laterality: N/A;   LIVER BIOPSY N/A 12/15/2020   Procedure: NEEDLE CORE LIVER BIOPSY;  Surgeon: Karie Soda, MD;  Location: [facility];  Service: General;  Laterality: N/A;   LUMBAR FUSION  2011   MASS EXCISION N/A 12/15/2020   Procedure: EXCISION MASS ABDOMINAL WALL;  Surgeon: Karie Soda, MD;  Location: [facility];  Service: General;  Laterality: N/A;   TONSILLECTOMY     age 50 per pt    Family History family history includes Alcoholism in her brother, father, and mother; Anemia in her brother; Heart attack in her maternal grandmother; Heart disease in her brother; Hyperlipidemia in her brother and maternal grandmother; Hypertension in her brother, maternal grandmother, and mother; Other in her brother;  Stroke in her mother.  Social History Social History   Socioeconomic History   Marital status: Divorced    Spouse name: Not on file   Number of children: 0   Years of education: Not on file   Highest education level: Not on file  Occupational History   Not on file  Tobacco Use   Smoking status: Former    Current packs/day: 0.00    Average packs/day: 2.0 packs/day for 15.0 years (30.0 ttl pk-yrs)    Types: Cigarettes    Start date: 10/07/1964    Quit date: 10/08/1979    Years since quitting: 43.5   Smokeless tobacco: Never  Vaping Use   Vaping status: Never Used  Substance and Sexual Activity   Alcohol use: No    Alcohol/week: 0.0 standard drinks of alcohol   Drug use: No   Sexual activity: Not on file  Other Topics Concern   Not on file  Social History Narrative   Not on file   Social Determinants of Health   Financial Resource Strain: Not on file  Food Insecurity: Not on file  Transportation Needs: Not on file  Physical Activity: Not on file  Stress: Not on file  Social Connections: Unknown (11/09/2022)  Received from [facility]   Social Network    Social Network: Not on file  Intimate Partner Violence: Unknown (11/09/2022)   [facility]   HITS    Physically Hurt: Not on file    Insult or Talk Down To: Not on file    Threaten Physical Harm: Not on file    Scream or Curse: Not on file    Lab Results  Component Value Date   HGBA1C 6.3 (A) 04/15/2023   HGBA1C 7.6 (H) 01/08/2023   HGBA1C 9.1 (A) 11/27/2022   Lab Results  Component Value Date   CHOL  01/24/2009    140        ATP III CLASSIFICATION:  <200     mg/dL   Desirable  161-096  mg/dL   Borderline High  >=045    mg/dL   High          Lab Results  Component Value Date   HDL 61 01/24/2009   Lab Results  Component Value Date   LDLCALC  01/24/2009    58        Total Cholesterol/HDL:CHD Risk Coronary Heart Disease Risk Table                      Men   Women  1/2 Average Risk   3.4   3.3  Average Risk       5.0   4.4  2 X Average Risk   9.6   7.1  3 X Average Risk  23.4   11.0        Use the calculated Patient Ratio above and the CHD Risk Table to determine the patient's CHD Risk.        ATP III CLASSIFICATION (LDL):  <100     mg/dL   Optimal  409-811  mg/dL   Near or Above                    Optimal  130-159  mg/dL   Borderline  914-782  mg/dL   High  >956     mg/dL   Very High   Lab Results  Component Value Date   TRIG 106 01/24/2009   Lab Results  Component Value Date   CHOLHDL 2.3 01/24/2009   Lab Results  Component Value Date   CREATININE 1.42 (H) 01/14/2023   No results found for: "GFR" No results found for: "MICROALBUR", "MALB24HUR"    Component Value Date/Time   NA 130 (L) 01/14/2023 2358   NA 138 10/03/2020 1511   K 4.5 01/14/2023 2358   CL 97 (L) 01/14/2023 2358   CO2 23 01/14/2023 2358   GLUCOSE 356 (H) 01/14/2023 2358   BUN 23 01/14/2023 2358   BUN 13 10/03/2020 1511   CREATININE 1.42 (H) 01/14/2023 2358   CALCIUM 8.7 (L) 01/14/2023 2358   PROT 7.1 01/08/2023 1600   ALBUMIN 3.8 01/08/2023 1600   AST 38 01/08/2023 1600   ALT 45 (H) 01/08/2023 1600   ALKPHOS 151 (H) 01/08/2023 1600   BILITOT 0.5 01/08/2023 1600   GFRNONAA 39 (L) 01/14/2023 2358   GFRAA >60 10/09/2017 1956      Latest Ref Rng & Units 01/14/2023   11:58 PM 01/08/2023    4:00 PM 12/03/2022    6:30 PM  BMP  Glucose 70 - 99 mg/dL 213  086  578   BUN 8 - 23 mg/dL 23  26  16  Creatinine 0.44 - 1.00 mg/dL 1.61  0.96  0.45   Sodium 135 - 145 mmol/L 130  136  133   Potassium 3.5 - 5.1 mmol/L 4.5  4.1  3.9   Chloride 98 - 111 mmol/L 97  98  98   CO2 22 - 32 mmol/L 23  25  23    Calcium 8.9 - 10.3 mg/dL 8.7  9.8  40.9        Component Value Date/Time   WBC 10.2 01/14/2023 2358   RBC 3.52 (L) 01/14/2023 2358   HGB 9.9 (L) 01/14/2023 2358   HCT 31.2 (L) 01/14/2023 2358   PLT 157 01/14/2023 2358   MCV 88.6 01/14/2023 2358    MCH 28.1 01/14/2023 2358   MCHC 31.7 01/14/2023 2358   RDW 13.1 01/14/2023 2358   LYMPHSABS 1.7 12/03/2022 1830   MONOABS 0.7 12/03/2022 1830   EOSABS 0.1 12/03/2022 1830   BASOSABS 0.0 12/03/2022 1830     Parts of this note may have been dictated using voice recognition software. There may be variances in spelling and vocabulary which are unintentional. Not all errors are proofread. Please notify the Thereasa Parkin if any discrepancies are noted or if the meaning of any statement is not clear.

## 2023-04-19 ENCOUNTER — Telehealth: Payer: Self-pay

## 2023-04-19 NOTE — Telephone Encounter (Signed)
Katie Owens is calling in today stating that the [place] is to expensive and she will need another alternative, please advise?

## 2023-04-22 ENCOUNTER — Other Ambulatory Visit: Payer: Self-pay | Admitting: "Endocrinology

## 2023-04-23 NOTE — Telephone Encounter (Signed)
I spoke to [facility] and she is aware to continue Glipizide XL 5 mg daily

## 2023-05-06 ENCOUNTER — Telehealth: Payer: Self-pay

## 2023-05-06 NOTE — Telephone Encounter (Signed)
As of January the Trulicity is no longer covered without a prior authorization

## 2023-06-06 ENCOUNTER — Other Ambulatory Visit: Payer: Self-pay | Admitting: "Endocrinology

## 2023-06-06 DIAGNOSIS — E1165 Type 2 diabetes mellitus with hyperglycemia: Secondary | ICD-10-CM

## 2023-06-07 ENCOUNTER — Telehealth: Payer: Self-pay

## 2023-06-07 NOTE — Telephone Encounter (Signed)
[name] is calling stating that she is needing her Trulicity refilled but she can't afford it it will be $400 out of pocket, she is asking if there is an alternative?

## 2023-06-11 ENCOUNTER — Telehealth: Payer: Self-pay | Admitting: "Endocrinology

## 2023-06-11 NOTE — Telephone Encounter (Signed)
Patient advised she is able to afford Trulicity, just an [place]

## 2023-06-12 ENCOUNTER — Ambulatory Visit: Payer: Medicare HMO | Admitting: "Endocrinology

## 2023-07-08 ENCOUNTER — Encounter: Payer: Medicare PPO | Attending: "Endocrinology | Admitting: Dietician

## 2023-07-08 ENCOUNTER — Encounter: Payer: Self-pay | Admitting: Dietician

## 2023-07-08 VITALS — Ht 59.0 in | Wt 223.0 lb

## 2023-07-08 DIAGNOSIS — E119 Type 2 diabetes mellitus without complications: Secondary | ICD-10-CM | POA: Diagnosis present

## 2023-07-08 NOTE — Progress Notes (Signed)
 Diabetes Self-Management Education  Visit Type: First/Initial  Appt. Start Time: 1445 Appt. End Time: 1545  07/08/2023  [name], identified by name and date of birth, is a 73 y.o. female with a diagnosis of Diabetes: Type 2.   ASSESSMENT Patient is here today with her roommate that does the cooking. She wants to know more about fruit and how to incorporate fruit without increasing her blood sugar.  She also wants to learn more about vegetables.  Referral:  Type 2 Diabetes History includes:  Type 2 Diabetes (2004),  HTN, HLD, GERD, OSA on C-pap, neuropathy, history of breast cancer, CKD Medications include:  Trulicity , glipizide , (could not afford Jardiance ), MVI, b-complex, biotin, magnesium , calcium , vitamin D  Labs noted to include:  A1C 6.3% 04/15/2023 decreased from 7.6% 01/08/2023, eGFR 39 on 01/14/2023   Weight hx: 59 223 lbs 07/07/2022 Lost 15 lbs before her back surgery in July but has regained her appetite now that she is not under pain. 140 lbs lowest adult weight in her 30's  Patient lives with a roommate.  Her roommate does the shopping and cooking. She worked for [employer] and is now retired. Uses a walker,, gets SOB frequently as she states that she is out of shape.  She did not do PT s/p back surgery due to the copay cost. Allergy to Shrimp (although states she can tolerate small amounts)  Height 4' 11 (1.499 m), weight 223 lb (101.2 kg). Body mass index is 45.04 kg/m.   Diabetes Self-Management Education - 07/08/23 1450       Visit Information   Visit Type First/Initial      Initial Visit   Diabetes Type Type 2    Date Diagnosed 2004    Are you currently following a meal plan? No    Are you taking your medications as prescribed? Yes      Health Coping   How would you rate your overall health? Fair      Psychosocial Assessment   Patient Belief/Attitude about Diabetes Motivated to manage diabetes    What is the  hardest part about your diabetes right now, causing you the most concern, or is the most worrisome to you about your diabetes?   Making healty food and beverage choices;Checking blood sugar;Being active;Taking/obtaining medications    Self-care barriers None    Self-management support Doctor's office;Friends;CDE visits    Other persons present Patient    Patient Concerns Nutrition/Meal planning    Special Needs None    Preferred Learning Style No preference indicated    Learning Readiness Ready    How often do you need to have someone help you when you read instructions, pamphlets, or other written materials from your doctor or pharmacy? 1 - Never    What is the last grade level you completed in school? Master's degree      Pre-Education Assessment   Patient understands the diabetes disease and treatment process. Needs Review    Patient understands incorporating nutritional management into lifestyle. Needs Review    Patient undertands incorporating physical activity into lifestyle. Needs Review    Patient understands using medications safely. Needs Review    Patient understands monitoring blood glucose, interpreting and using results Needs Review    Patient understands prevention, detection, and treatment of acute complications. Needs Review    Patient understands prevention, detection, and treatment of chronic complications. Needs Review    Patient understands how to develop strategies to address psychosocial issues. Needs Review  Patient understands how to develop strategies to promote health/change behavior. Needs Review      Complications   Last HgB A1C per patient/outside source 6.3 %   04/15/23 decreased from 7.6% 01/08/2023   How often do you check your blood sugar? 0 times/day (not testing)   states not recommended by MD   Number of hypoglycemic episodes per month 0    Have you had a dilated eye exam in the past 12 months? Yes    Have you had a dental exam in the past 12 months?  Yes    Are you checking your feet? Yes    How many days per week are you checking your feet? 7      Dietary Intake   Breakfast high protein cereal, whole milk (lactose free) OR 2 fried eggs on toast   10 am   Snack (morning) none    Lunch skips    Snack (afternoon) almonds, cottage cheese   3 pm   Dinner chicken, knorr noodles, mixed vegetables (corn, peas, carrots)    Snack (evening) occasional if she eats a light dinner    Beverage(s) diet caffeine free coke, diet green tea (homemade) with splenda, water (20 oz), coffee with truvia, creamer, whole milk      Activity / Exercise   Activity / Exercise Type ADL's      Patient Education   Previous Diabetes Education Yes (please comment)   2004   Disease Pathophysiology Definition of diabetes, type 1 and 2, and the diagnosis of diabetes    Healthy Eating Role of diet in the treatment of diabetes and the relationship between the three main macronutrients and blood glucose level;Plate Method;Food label reading, portion sizes and measuring food.;Meal options for control of blood glucose level and chronic complications.    Being Active Role of exercise on diabetes management, blood pressure control and cardiac health.;Helped patient identify appropriate exercises in relation to his/her diabetes, diabetes complications and other health issue.    Medications Reviewed patients medication for diabetes, action, purpose, timing of dose and side effects.    Monitoring Purpose and frequency of SMBG.;Identified appropriate SMBG and/or A1C goals.;Daily foot exams;Yearly dilated eye exam    Acute complications Taught prevention, symptoms, and  treatment of hypoglycemia - the 15 rule.;Discussed and identified patients' prevention, symptoms, and treatment of hyperglycemia.    Chronic complications Relationship between chronic complications and blood glucose control;Identified and discussed with patient  current chronic complications    Diabetes Stress and  Support Identified and addressed patients feelings and concerns about diabetes;Worked with patient to identify barriers to care and solutions;Role of stress on diabetes      Individualized Goals (developed by patient)   Nutrition General guidelines for healthy choices and portions discussed;Follow meal plan discussed    Physical Activity Exercise 5-7 days per week;15 minutes per day    Medications take my medication as prescribed    Monitoring  Test my blood glucose as discussed    Problem Solving Eating Pattern;Addressing barriers to behavior change    Reducing Risk do foot checks daily;treat hypoglycemia with 15 grams of carbs if blood glucose less than 70mg /dL      Post-Education Assessment   Patient understands the diabetes disease and treatment process. Demonstrates understanding / competency    Patient understands incorporating nutritional management into lifestyle. Comprehends key points    Patient undertands incorporating physical activity into lifestyle. Demonstrates understanding / competency    Patient understands using medications safely. Demonstrates understanding /  competency    Patient understands monitoring blood glucose, interpreting and using results Demonstrates understanding / competency    Patient understands prevention, detection, and treatment of acute complications. Demonstrates understanding / competency    Patient understands prevention, detection, and treatment of chronic complications. Demonstrates understanding / competency    Patient understands how to develop strategies to address psychosocial issues. Demonstrates understanding / competency    Patient understands how to develop strategies to promote health/change behavior. Comprehends key points      Outcomes   Expected Outcomes Demonstrated interest in learning. Expect positive outcomes    Future DMSE PRN    Program Status Completed             Individualized Plan for Diabetes Self-Management Training:    Learning Objective:  Patient will have a greater understanding of diabetes self-management. Patient education plan is to attend individual and/or group sessions per assessed needs and concerns.   Plan:   Patient Instructions  Let's go to the kitchen and get something to drink together. Consider armchair exercises.  Start slowly and increase slowly to 30 minutes total per day (walking and other exercise) Consider diet sprite or diet 7-up rather than diet coke - avoid dark soda.  Plan:  Aim for 3-4 Carb Choices per meal (45-60 grams) +/- 1 either way  Aim for 0-1 Carbs per snack if hungry  Include protein in moderation with your meals and snacks Consider reading food labels for Total Carbohydrate of foods Consider  increasing your activity level by walking or armchair exercise as tolerated Consider checking BG at alternate times per day  Continue  taking medication as directed by MD    Expected Outcomes:  Demonstrated interest in learning. Expect positive outcomes  Education material provided: ADA - How to Thrive: A Guide for Your Journey with Diabetes, Food label handouts, Meal plan card, Snack sheet, and Diabetes Resources  If problems or questions, patient to contact team via:  Phone  Future DSME appointment: PRN

## 2023-07-08 NOTE — Patient Instructions (Addendum)
 Let's go to the kitchen and get something to drink together. Consider armchair exercises.  Start slowly and increase slowly to 30 minutes total per day (walking and other exercise) Consider diet sprite or diet 7-up rather than diet coke - avoid dark soda.  Plan:  Aim for 3-4 Carb Choices per meal (45-60 grams) +/- 1 either way  Aim for 0-1 Carbs per snack if hungry  Include protein in moderation with your meals and snacks Consider reading food labels for Total Carbohydrate of foods Consider  increasing your activity level by walking or armchair exercise as tolerated Consider checking BG at alternate times per day  Continue  taking medication as directed by MD  Mindfulness:  Consistently scheduled meal - avoid skipping  Choices  Eat slowly  Away from distraction (sitting in kitchen or dining room)  Stop eating when satisfied  Before a snack ask, Am I hungry or eating for another reason?   What can I do instead if I am not hungry?  Try to find something every day that brings you joy!

## 2023-07-16 ENCOUNTER — Telehealth: Payer: Self-pay

## 2023-07-16 ENCOUNTER — Ambulatory Visit: Payer: Medicare PPO | Admitting: "Endocrinology

## 2023-07-16 ENCOUNTER — Encounter: Payer: Self-pay | Admitting: "Endocrinology

## 2023-07-16 VITALS — BP 122/70 | HR 81 | Ht 59.0 in | Wt 222.0 lb

## 2023-07-16 DIAGNOSIS — E1140 Type 2 diabetes mellitus with diabetic neuropathy, unspecified: Secondary | ICD-10-CM

## 2023-07-16 DIAGNOSIS — Z7984 Long term (current) use of oral hypoglycemic drugs: Secondary | ICD-10-CM | POA: Diagnosis not present

## 2023-07-16 DIAGNOSIS — Z7985 Long-term (current) use of injectable non-insulin antidiabetic drugs: Secondary | ICD-10-CM | POA: Diagnosis not present

## 2023-07-16 DIAGNOSIS — E7800 Pure hypercholesterolemia, unspecified: Secondary | ICD-10-CM | POA: Diagnosis not present

## 2023-07-16 LAB — POCT GLYCOSYLATED HEMOGLOBIN (HGB A1C): Hemoglobin A1C: 6.1 % — AB (ref 4.0–5.6)

## 2023-07-16 LAB — POCT GLUCOSE (DEVICE FOR HOME USE): POC Glucose: 105 mg/dL — AB (ref 70–99)

## 2023-07-16 MED ORDER — EMPAGLIFLOZIN 10 MG PO TABS: 10.0000 mg | ORAL_TABLET | Freq: Every day | ORAL | 0 refills | Status: DC

## 2023-07-16 NOTE — Telephone Encounter (Signed)
 Medication Samples have been provided to the patient.  Drug name: Jardiance        Strength: 10mg         Qty: 1 bottle  LOT:  [id number] Exp.Date: 08/01/2024  Dosing instructions: Take 1 tab daily  The patient has been instructed regarding the correct time, dose, and frequency of taking this medication, including desired effects and most common side effects.   Levy Wellman L Tylon Kemmerling 2:16 PM 07/16/2023

## 2023-07-16 NOTE — Progress Notes (Signed)
 Outpatient Endocrinology Note Katie Birmingham, MD  07/16/23   Katie Owens May 18, 1951 [id number]  Referring Provider: Windy Coy, MD Primary Care Provider: Windy Coy, MD Reason for consultation: Subjective   Assessment & Plan  Jeronda Don was seen today for follow-up.  Diagnoses and all orders for this visit:  Type 2 diabetes mellitus with diabetic neuropathy, without long-term current use of insulin  (HCC) -     POCT glycosylated hemoglobin (Hb A1C) -     POCT Glucose (Device for Home Use) -     Microalbumin / creatinine urine ratio -     Lipid panel -     Comprehensive metabolic panel -     Hemoglobin A1c  Long term (current) use of oral hypoglycemic drugs  Long-term (current) use of injectable non-insulin  antidiabetic drugs  Pure hypercholesterolemia  Other orders -     empagliflozin  (JARDIANCE ) 10 MG TABS tablet; Take 1 tablet (10 mg total) by mouth daily before breakfast.   Diabetes complicated by neuropathy, nephropathy  Hba1c goal less than 7.0, current Hba1c is 6.1%. Will recommend for the following change of medications to: Stopped metformin XR 500 mg twice daily due to renal dysfunction  On Trulicity  1.5 mg weekly (no weight loss, if continued the same response will replace it with mounjaro  in future) Replace  glipizide  XL 5 mg daily (skip if BG is less than 100) with jardiance  10 mg every day  Patient previously tolerated Ozempic  well, unable to afford now  No known contraindications to any of above medications No history of MEN syndrome/medullary thyroid cancer/pancreatitis or pancreatic cancer in self or family Discussed S/E of jardiance   Hyperlipidemia -Last LDL off goal: 90 on rosuvastatin  10 mg every day -Continue rosuvastatin  20 mg every day -Follow low fat diet and exercise    -Blood pressure goal <140/90 - Microalbumin/creatinine goal < 30, GFR low - on ACE/ARB Telmisartan 80 mg qd: defer to nephrology  -diet changes  including salt restriction -limit eating outside -counseled BP targets per standards of diabetes care -Uncontrolled blood pressure can lead to retinopathy, nephropathy and cardiovascular and atherosclerotic heart disease  Reviewed and counseled on: -A1C target -Blood sugar targets -Complications of uncontrolled diabetes  -Checking blood sugar before meals and bedtime and bring log next visit -All medications with mechanism of action and side effects -Hypoglycemia management: rule of 15's, Glucagon Emergency Kit and medical alert ID -low-carb low-fat plate-method diet -At least 20 minutes of physical activity per day -Annual dilated retinal eye exam and foot exam -compliance and follow up needs -follow up as scheduled or earlier if problem gets worse  Call if blood sugar is less than 70 or consistently above 250    Take a 15 gm snack of carbohydrate at bedtime before you go to sleep if your blood sugar is less than 100.    If you are going to fast after midnight for a test or procedure, ask your physician for instructions on how to reduce/decrease your insulin  dose.    Call if blood sugar is less than 70 or consistently above 250  -Treating a low sugar by rule of 15  (15 gms of sugar every 15 min until sugar is more than 70) If you feel your sugar is low, test your sugar to be sure If your sugar is low (less than 70), then take 15 grams of a fast acting Carbohydrate (3-4 glucose tablets or glucose gel or 4 ounces of juice or regular soda) Recheck your sugar  15 min after treating low to make sure it is more than 70 If sugar is still less than 70, treat again with 15 grams of carbohydrate          Don't drive the hour of hypoglycemia  If unconscious/unable to eat or drink by mouth, use glucagon injection or nasal spray baqsimi and call 911. Can repeat again in 15 min if still unconscious.  Return in about 4 months (around 11/13/2023) for visit and 8 am labs before next visit.   I  have reviewed current medications, nurse's notes, allergies, vital signs, past medical and surgical history, family medical history, and social history for this encounter. Counseled patient on symptoms, examination findings, lab findings, imaging results, treatment decisions and monitoring and prognosis. The patient understood the recommendations and agrees with the treatment plan. All questions regarding treatment plan were fully answered.  Katie Birmingham, MD  07/16/23    History of Present Illness Katie Owens is a 73 y.o. year old female who presents for follow up of Type 2 diabetes mellitus.  Katie Owens was first diagnosed in 2014.   Diabetes education +  Home diabetes regimen: Trulicity  1.5 mg weekly Glipizide  XL 5 mg daily   Stopped Metformin ER 500 mg bid due to renal dysfunction (1000 bid lead to diarrhea)  Cannot afford ozempic  - no issues   COMPLICATIONS -  MI/Stroke -  retinopathy +  neuropathy +  nephropathy  SYMPTOMS REVIEWED - Polyuria - Weight loss - Blurred vision  BLOOD SUGAR DATA Per meter, checks 2-3 times a day Well controlled BG  11/22/2022 AST 36 ALT 25 Cr 1.2 GFR 47 LDL 31 Tg 124    Physical Exam  BP 122/70 (BP Location: Left Arm, Patient Position: Sitting, Cuff Size: Normal)   Pulse 81   Ht 4' 11 (1.499 m)   Wt 222 lb (100.7 kg)   SpO2 96%   BMI 44.84 kg/m    Constitutional: well developed, well nourished Head: normocephalic, atraumatic Eyes: sclera anicteric, no redness Neck: supple Lungs: normal respiratory effort Neurology: alert and oriented Skin: dry, no appreciable rashes Musculoskeletal: no appreciable defects Psychiatric: normal mood and affect Diabetic Foot Exam - Simple   No data filed      Current Medications Patient's Medications  New Prescriptions   EMPAGLIFLOZIN  (JARDIANCE ) 10 MG TABS TABLET    Take 1 tablet (10 mg total) by mouth daily before breakfast.  Previous Medications   ACCU-CHEK GUIDE TEST  TEST STRIP    CHECK BLOOD SUGARS IN THE MORNING, AT NOON, AND AT BEDTIME.   ALPRAZOLAM  (XANAX ) 0.5 MG TABLET    Take 0.5 mg by mouth at bedtime as needed for sleep or anxiety.   ARIPIPRAZOLE  (ABILIFY ) 2 MG TABLET    Take 2 mg by mouth every morning.   B COMPLEX VITAMINS TABLET    Take 1 tablet by mouth daily.   BIOTIN 5 MG CAPS    Take 5 mg by mouth daily.   BLOOD GLUCOSE MONITORING SUPPL DEVI    1 each by Does not apply route in the morning, at noon, and at bedtime. May substitute to any manufacturer covered by patient's insurance.   CALCIUM  CITRATE (CITRACAL PO)    Take 2 tablets by mouth daily.   CARVEDILOL  (COREG ) 25 MG TABLET    Take 1 tablet (25 mg total) by mouth 2 (two) times daily.   CELECOXIB  (CELEBREX ) 200 MG CAPSULE    Take 200 mg by mouth 2 (two) times  daily.   CHLORPHENIRAMINE-HYDROCODONE  (TUSSIONEX) 10-8 MG/5ML    SMARTSIG:1 teaspoon By Mouth Every 12 Hours PRN   CHOLECALCIFEROL  (VITAMIN D ) 50 MCG (2000 UT) TABLET    Take 2,000 Units by mouth daily.    DICYCLOMINE  (BENTYL ) 20 MG TABLET    Take 20 mg by mouth every 8 (eight) hours as needed for spasms.   DILTIAZEM  (CARDIZEM ) 30 MG TABLET    Take 30 mg by mouth every 4 (four) hours as needed (for HR>100 and BP>100).   DIPHENHYDRAMINE  (BENADRYL ) 25 MG TABLET    Take 25 mg by mouth every 6 (six) hours as needed for allergies.   DULAGLUTIDE  (TRULICITY ) 1.5 MG/0.5ML SOAJ    INJECT 1.5 MG INTO THE SKIN ONCE A WEEK.   DULOXETINE  (CYMBALTA ) 60 MG CAPSULE    Take 60 mg by mouth daily.   EMPAGLIFLOZIN  (JARDIANCE ) 10 MG TABS TABLET    Take 1 tablet (10 mg total) by mouth daily before breakfast.   GLIPIZIDE  (GLUCOTROL  XL) 5 MG 24 HR TABLET    TAKE 1 TABLET BY MOUTH EVERY DAY WITH BREAKFAST   KLOR-CON  M20 20 MEQ TABLET    Take 20 mEq by mouth daily.   LOPERAMIDE  (IMODIUM ) 2 MG CAPSULE    Take 2-4 mg by mouth as needed for diarrhea or loose stools.   MAGNESIUM  OXIDE (MAG-OX) 400 (240 MG) MG TABLET    TAKE 1 TABLET BY MOUTH EVERY DAY    METHOCARBAMOL  (ROBAXIN ) 500 MG TABLET    Take 1 tablet (500 mg total) by mouth every 6 (six) hours as needed for muscle spasms.   MULTIPLE VITAMINS-MINERALS (MULTIVITAMIN ADULT PO)    Take 1 tablet by mouth daily.   OMEPRAZOLE (PRILOSEC) 20 MG CAPSULE    Take 20 mg by mouth daily.   OXYCODONE  (OXY IR/ROXICODONE ) 5 MG IMMEDIATE RELEASE TABLET    Take 1 tablet (5 mg total) by mouth every 4 (four) hours as needed for moderate pain.   PREGABALIN  (LYRICA ) 75 MG CAPSULE    Take 75 mg by mouth 3 (three) times daily.   ROSUVASTATIN  (CRESTOR ) 20 MG TABLET    Take 20 mg by mouth daily after supper.   SUMATRIPTAN  (IMITREX ) 100 MG TABLET    Take 100 mg by mouth as needed for migraine.   TELMISARTAN (MICARDIS) 80 MG TABLET    Take 80 mg by mouth daily.   VENTOLIN  HFA 108 (90 BASE) MCG/ACT INHALER    Inhale 1-2 puffs into the lungs every 6 (six) hours as needed for wheezing or shortness of breath.   Modified Medications   No medications on file  Discontinued Medications   CHLORTHALIDONE  (HYGROTON ) 25 MG TABLET    Take 25 mg by mouth daily.   CYCLOBENZAPRINE  (FLEXERIL ) 10 MG TABLET    Take 10 mg by mouth at bedtime as needed for muscle spasms.   TRAMADOL  (ULTRAM ) 50 MG TABLET    Take 1-2 tablets (50-100 mg total) by mouth every 6 (six) hours as needed for moderate pain or severe pain.    Allergies Allergies  Allergen Reactions   Penicillins     Joints ache   Aspirin Hives and Rash   Ibuprofen Hives and Rash   Naproxen Hives and Rash   Tape Rash    Adhesive tape causes rashes. Paper tape is okay to use    Past Medical History Past Medical History:  Diagnosis Date   Abdominal wall mass 12/09/2020   lipoma, s/p excision 12/15/20 at time of cholecystectomy  Arthritis 12/09/2020   oa   Asthma    Cancer (HCC) 07/02/1993   breast cancer lt with 12 nodes out, chemo and radiation done   Chronic cholecystitis 12/09/2020   s/p cholecystectomy 12/15/20   chronic kidney disease 12/09/2020   stage 3 per  pt   Contact lens/glasses fitting    wears contacts or glasses   COVID    jan  2022 cold and headache x 7 -8 days/all symptoms resolved per pt   Depression    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   Dysrhythmia    hx AF had ablation in 2012, has occ pvc's per pt   Fatty liver    Fibromyalgia    GERD (gastroesophageal reflux disease)    History of kidney stones    passed on own in past per pt   Hyperlipemia    Hypertension    IBS (irritable bowel syndrome)    migraines 12/09/2020   with weather changes per pt   PONV (postoperative nausea and vomiting)    Sciatica 12/09/2020   right butt cheek and right leg and lower spine   Seasonal allergies    Shortness of breath 12/09/2020   on exertion   Sleep apnea    cpap broken waiting on new cpap, severe osa last sleep study home few months ago ( 12-09-2020 )   Uses walker 12/09/2020   left walker with friend out of the area   Wears glasses 12/09/2020    Past Surgical History Past Surgical History:  Procedure Laterality Date   BREAST LUMPECTOMY WITH AXILLARY LYMPH NODE DISSECTION Left 07/02/1993   12 nodes removed   CARDIAC CATHETERIZATION  01/24/2009   left heart   CARDIAC ELECTROPHYSIOLOGY MAPPING AND ABLATION  07/02/2010   CARPAL TUNNEL RELEASE Right 10/14/2012   Procedure: CARPAL TUNNEL RELEASE;  Surgeon: Arley JONELLE Curia, MD;  Location: [facility];  Service: Orthopedics;  Laterality: Right;   CHOLECYSTECTOMY N/A 12/15/2020   Procedure: LAPAROSCOPIC CHOLECYSTECTOMY WITH INTRAOPERATIVE CHOLANGIOGRAM, TAP BLOCK;  Surgeon: Sheldon Standing, MD;  Location: [facility];  Service: General;  Laterality: N/A;   COLONOSCOPY  2021   DIAGNOSTIC LAPAROSCOPY  07/02/1988   EP IMPLANTABLE DEVICE N/A 03/08/2016   Procedure: Loop Recorder Insertion;  Surgeon: Lynwood Rakers, MD;  Location: [facility];  Service: Cardiovascular;  Laterality: N/A;   LIVER BIOPSY N/A 12/15/2020   Procedure: NEEDLE CORE LIVER BIOPSY;  Surgeon: Sheldon Standing, MD;  Location[facility] [place] [place];  Service: General;  Laterality: N/A;   LUMBAR FUSION  2011   MASS EXCISION N/A 12/15/2020   Procedure: EXCISION MASS ABDOMINAL WALL;  Surgeon: Sheldon Standing, MD;  Location: [facility];  Service: General;  Laterality: N/A;   TONSILLECTOMY     age 64 per pt    Family History family history includes Alcoholism in her brother, father, and mother; Anemia in her brother; Heart attack in her maternal grandmother; Heart disease in her brother; Hyperlipidemia in her brother and maternal grandmother; Hypertension in her brother, maternal grandmother, and mother; Other in her brother; Stroke in her mother.  Social History Social History   Socioeconomic History   Marital status: Divorced    Spouse name: Not on file   Number of children: 0   Years of education: Not on file   Highest education level: Not on file  Occupational History   Not on file  Tobacco Use   Smoking status: Former    Current packs/day: 0.00  Average packs/day: 2.0 packs/day for 15.0 years (30.0 ttl pk-yrs)    Types: Cigarettes    Start date: 10/07/1964    Quit date: 10/08/1979    Years since quitting: 43.8   Smokeless tobacco: Never  Vaping Use   Vaping status: Never Used  Substance and Sexual Activity   Alcohol use: No    Alcohol/week: 0.0 standard drinks of alcohol   Drug use: No   Sexual activity: Not on file  Other Topics Concern   Not on file  Social History Narrative   Not on file   Social Drivers of Health   Financial Resource Strain: Not on file  Food Insecurity: Low Risk  (05/22/2023)   Received from [facility]   Hunger Vital Sign    Worried About Running Out of Food in the Last Year: Never true    Ran Out of Food in the Last Year: Never true  Transportation Needs: No Transportation Needs (05/22/2023)   Received from [facility]    In the past 12 months, has lack of reliable transportation kept you from medical  appointments, meetings, work or from getting things needed for daily living? : No  Physical Activity: Not on file  Stress: Not on file  Social Connections: Unknown (11/09/2022)   Received from [facility], [facility]   Social Network    Social Network: Not on file  Intimate Partner Violence: Unknown (11/09/2022)   Received from [employer], [facility]   HITS    Physically Hurt: Not on file    Insult or Talk Down To: Not on file    Threaten Physical Harm: Not on file    Scream or Curse: Not on file    Lab Results  Component Value Date   HGBA1C 6.1 (A) 07/16/2023   HGBA1C 6.3 (A) 04/15/2023   HGBA1C 7.6 (H) 01/08/2023   Lab Results  Component Value Date   CHOL  01/24/2009    140        ATP III CLASSIFICATION:  <200     mg/dL   Desirable  799-760  mg/dL   Borderline High  >=759    mg/dL   High          Lab Results  Component Value Date   HDL 61 01/24/2009   Lab Results  Component Value Date   [facility]  01/24/2009    58        Total Cholesterol/HDL:CHD Risk Coronary Heart Disease Risk Table                     Men   Women  1/2 Average Risk   3.4   3.3  Average Risk       5.0   4.4  2 X Average Risk   9.6   7.1  3 X Average Risk  23.4   11.0        Use the calculated Patient Ratio above and the CHD Risk Table to determine the patient's CHD Risk.        ATP III CLASSIFICATION (LDL):  <100     mg/dL   Optimal  899-870  mg/dL   Near or Above                    Optimal  130-159  mg/dL   Borderline  839-810  mg/dL   High  >809     mg/dL   Very High   Lab Results  Component  Value Date   TRIG 106 01/24/2009   Lab Results  Component Value Date   CHOLHDL 2.3 01/24/2009   Lab Results  Component Value Date   CREATININE 1.42 (H) 01/14/2023   No results found for: GFR No results found for: MACKEY CURRENT    Component Value Date/Time   NA 130 (L) 01/14/2023 2358   NA 138 10/03/2020 1511   K 4.5 01/14/2023 2358   CL 97 (L) 01/14/2023 2358    CO2 23 01/14/2023 2358   GLUCOSE 356 (H) 01/14/2023 2358   BUN 23 01/14/2023 2358   BUN 13 10/03/2020 1511   CREATININE 1.42 (H) 01/14/2023 2358   CALCIUM  8.7 (L) 01/14/2023 2358   PROT 7.1 01/08/2023 1600   ALBUMIN  3.8 01/08/2023 1600   AST 38 01/08/2023 1600   ALT 45 (H) 01/08/2023 1600   ALKPHOS 151 (H) 01/08/2023 1600   BILITOT 0.5 01/08/2023 1600   GFRNONAA 39 (L) 01/14/2023 2358   GFRAA >60 10/09/2017 1956      Latest Ref Rng & Units 01/14/2023   11:58 PM 01/08/2023    4:00 PM 12/03/2022    6:30 PM  BMP  Glucose 70 - 99 mg/dL 643  884  756   BUN 8 - 23 mg/dL 23  26  16    Creatinine 0.44 - 1.00 mg/dL 8.57  8.69  8.87   Sodium 135 - 145 mmol/L 130  136  133   Potassium 3.5 - 5.1 mmol/L 4.5  4.1  3.9   Chloride 98 - 111 mmol/L 97  98  98   CO2 22 - 32 mmol/L 23  25  23    Calcium  8.9 - 10.3 mg/dL 8.7  9.8  89.9        Component Value Date/Time   WBC 10.2 01/14/2023 2358   RBC 3.52 (L) 01/14/2023 2358   HGB 9.9 (L) 01/14/2023 2358   HCT 31.2 (L) 01/14/2023 2358   PLT 157 01/14/2023 2358   MCV 88.6 01/14/2023 2358   MCH 28.1 01/14/2023 2358   MCHC 31.7 01/14/2023 2358   RDW 13.1 01/14/2023 2358   LYMPHSABS 1.7 12/03/2022 1830   MONOABS 0.7 12/03/2022 1830   EOSABS 0.1 12/03/2022 1830   BASOSABS 0.0 12/03/2022 1830     Parts of this note may have been dictated using voice recognition software. There may be variances in spelling and vocabulary which are unintentional. Not all errors are proofread. Please notify the dino if any discrepancies are noted or if the meaning of any statement is not clear.

## 2023-08-31 ENCOUNTER — Other Ambulatory Visit: Payer: Self-pay | Admitting: "Endocrinology

## 2023-08-31 DIAGNOSIS — E1165 Type 2 diabetes mellitus with hyperglycemia: Secondary | ICD-10-CM

## 2023-11-06 ENCOUNTER — Other Ambulatory Visit: Payer: Medicare PPO

## 2023-11-07 LAB — COMPREHENSIVE METABOLIC PANEL WITH GFR
AG Ratio: 1.6 (calc) (ref 1.0–2.5)
ALT: 13 U/L (ref 6–29)
AST: 18 U/L (ref 10–35)
Albumin: 4.3 g/dL (ref 3.6–5.1)
Alkaline phosphatase (APISO): 135 U/L (ref 37–153)
BUN/Creatinine Ratio: 15 (calc) (ref 6–22)
BUN: 19 mg/dL (ref 7–25)
CO2: 25 mmol/L (ref 20–32)
Calcium: 9.8 mg/dL (ref 8.6–10.4)
Chloride: 103 mmol/L (ref 98–110)
Creat: 1.29 mg/dL — ABNORMAL HIGH (ref 0.60–1.00)
Globulin: 2.7 g/dL (ref 1.9–3.7)
Glucose, Bld: 135 mg/dL — ABNORMAL HIGH (ref 65–99)
Potassium: 4.4 mmol/L (ref 3.5–5.3)
Sodium: 138 mmol/L (ref 135–146)
Total Bilirubin: 0.4 mg/dL (ref 0.2–1.2)
Total Protein: 7 g/dL (ref 6.1–8.1)
eGFR: 44 mL/min/{1.73_m2} — ABNORMAL LOW (ref >=60)

## 2023-11-07 LAB — LIPID PANEL
Cholesterol: 122 mg/dL (ref <200)
HDL: 63 mg/dL (ref >=50)
LDL Cholesterol (Calc): 39 mg/dL
Non-HDL Cholesterol (Calc): 59 mg/dL (ref <130)
Total CHOL/HDL Ratio: 1.9 (calc) (ref <5.0)
Triglycerides: 118 mg/dL (ref <150)

## 2023-11-07 LAB — MICROALBUMIN / CREATININE URINE RATIO
Creatinine, Urine: 86 mg/dL (ref 20–275)
Microalb, Ur: <0.2 mg/dL

## 2023-11-07 LAB — HEMOGLOBIN A1C
Hgb A1c MFr Bld: 7 % — ABNORMAL HIGH (ref <5.7)
Mean Plasma Glucose: 154 mg/dL
eAG (mmol/L): 8.5 mmol/L

## 2023-11-12 ENCOUNTER — Ambulatory Visit (INDEPENDENT_AMBULATORY_CARE_PROVIDER_SITE_OTHER): Payer: Medicare PPO | Admitting: "Endocrinology

## 2023-11-12 ENCOUNTER — Encounter: Payer: Self-pay | Admitting: "Endocrinology

## 2023-11-12 VITALS — BP 124/70 | HR 65 | Ht 59.0 in | Wt 222.0 lb

## 2023-11-12 DIAGNOSIS — E1165 Type 2 diabetes mellitus with hyperglycemia: Secondary | ICD-10-CM

## 2023-11-12 DIAGNOSIS — Z7984 Long term (current) use of oral hypoglycemic drugs: Secondary | ICD-10-CM

## 2023-11-12 DIAGNOSIS — E7800 Pure hypercholesterolemia, unspecified: Secondary | ICD-10-CM | POA: Diagnosis not present

## 2023-11-12 DIAGNOSIS — Z7985 Long-term (current) use of injectable non-insulin antidiabetic drugs: Secondary | ICD-10-CM

## 2023-11-12 MED ORDER — TRULICITY 3 MG/0.5ML ~~LOC~~ SOAJ: 3.0000 mg | SUBCUTANEOUS | 1 refills | Status: DC

## 2023-11-12 NOTE — Progress Notes (Signed)
 Outpatient Endocrinology Note Katie Newcomer, MD  11/12/23   Katie Owens [birth year] [id number]  Referring Provider: Candise Chambers, MD Primary Care Provider: Candise Chambers, MD Reason for consultation: Subjective   Assessment & Plan  Diagnoses and all orders for this visit:  Uncontrolled type 2 diabetes mellitus with hyperglycemia (HCC)  Long term (current) use of oral hypoglycemic drugs  Long-term (current) use of injectable non-insulin  antidiabetic drugs  Pure hypercholesterolemia  Other orders -     Dulaglutide  (TRULICITY ) 3 MG/0.5ML SOAJ; Inject 3 mg as directed once a week.    Diabetes complicated by neuropathy, nephropathy  Hba1c goal less than 7.0, current Hba1c is 7%. Will recommend for the following change of medications to: Jardiance  10 mg every day On Trulicity  3 mg weekly (no weight loss, if continued the same response will replace it with mounjaro in future)  Stopped metformin XR 500 mg twice daily due to renal dysfunction  Stopped glipizide  XL 5 mg daily (skip if BG is less than 100) with  Patient previously tolerated Ozempic  well, unable to afford now  No known contraindications to any of above medications No history of MEN syndrome/medullary thyroid cancer/pancreatitis or pancreatic cancer in self or family Discussed S/E of jardiance   Hyperlipidemia -Last LDL off goal: 90 on rosuvastatin  10 mg every day -Continue rosuvastatin  20 mg every day, LDL 39 -Follow low fat diet and exercise   -Blood pressure goal <140/90 - Microalbumin/creatinine goal < 30, GFR low - on ACE/ARB Telmisartan 80 mg qd: defer to nephrology  -diet changes including salt restriction -limit eating outside -counseled BP targets per standards of diabetes care -Uncontrolled blood pressure can lead to retinopathy, nephropathy and cardiovascular and atherosclerotic heart disease  Reviewed and counseled on: -A1C target -Blood sugar targets -Complications of  uncontrolled diabetes  -Checking blood sugar before meals and bedtime and bring log next visit -All medications with mechanism of action and side effects -Hypoglycemia management: rule of 15's, Glucagon Emergency Kit and medical alert ID -low-carb low-fat plate-method diet -At least 20 minutes of physical activity per day -Annual dilated retinal eye exam and foot exam -compliance and follow up needs -follow up as scheduled or earlier if problem gets worse  Call if blood sugar is less than 70 or consistently above 250    Take a 15 gm snack of carbohydrate at bedtime before you go to sleep if your blood sugar is less than 100.    If you are going to fast after midnight for a test or procedure, ask your physician for instructions on how to reduce/decrease your insulin  dose.    Call if blood sugar is less than 70 or consistently above 250  -Treating a low sugar by rule of 15  (15 gms of sugar every 15 min until sugar is more than 70) If you feel your sugar is low, test your sugar to be sure If your sugar is low (less than 70), then take 15 grams of a fast acting Carbohydrate (3-4 glucose tablets or glucose gel or 4 ounces of juice or regular soda) Recheck your sugar 15 min after treating low to make sure it is more than 70 If sugar is still less than 70, treat again with 15 grams of carbohydrate          Don't drive the hour of hypoglycemia  If unconscious/unable to eat or drink by mouth, use glucagon injection or nasal spray baqsimi and call 911. Can repeat again in 15 min if still unconscious.  Return in about 5 weeks (around 12/17/2023).   I have reviewed current medications, nurse's notes, allergies, vital signs, past medical and surgical history, family medical history, and social history for this encounter. Counseled patient on symptoms, examination findings, lab findings, imaging results, treatment decisions and monitoring and prognosis. The patient understood the recommendations and  agrees with the treatment plan. All questions regarding treatment plan were fully answered.  Katie Newcomer, MD  11/12/23    History of Present Illness Katie Owens is a 73 y.o. year old female who presents for follow up of Type 2 diabetes mellitus.  Katie Owens was first diagnosed in 2014.   Diabetes education +  Home diabetes regimen: Trulicity  1.5 mg weekly Jardiance  10 mg every day  Stopped Glipizide  XL 5 mg daily  Stopped Metformin ER 500 mg bid due to renal dysfunction (1000 bid lead to diarrhea)  Cannot afford ozempic  - no issues   COMPLICATIONS -  MI/Stroke -  retinopathy +  neuropathy +  nephropathy  SYMPTOMS REVIEWED - Polyuria - Weight loss - Blurred vision  BLOOD SUGAR DATA Did not bring meter Checks about once every other day Range per recall: 120-220  11/22/2022 AST 36 ALT 25 Cr 1.2 GFR 47 LDL 31 Tg 124  Physical Exam BP 124/70   Pulse 65   Ht 4\' 11"  (1.499 m)   Wt 222 lb (100.7 kg)   SpO2 98%   BMI 44.84 kg/m    Constitutional: well developed, well nourished Head: normocephalic, atraumatic Eyes: sclera anicteric, no redness Neck: supple Lungs: normal respiratory effort Neurology: alert and oriented Skin: dry, no appreciable rashes Musculoskeletal: no appreciable defects Psychiatric: normal mood and affect Diabetic Foot Exam - Simple   No data filed      Current Medications Patient's Medications  New Prescriptions   DULAGLUTIDE  (TRULICITY ) 3 MG/0.5ML SOAJ    Inject 3 mg as directed once a week.  Previous Medications   ACCU-CHEK GUIDE TEST TEST STRIP    CHECK BLOOD SUGARS IN THE MORNING, AT NOON, AND AT BEDTIME.   ALPRAZOLAM  (XANAX ) 0.5 MG TABLET    Take 0.5 mg by mouth at bedtime as needed for sleep or anxiety.   ARIPIPRAZOLE  (ABILIFY ) 2 MG TABLET    Take 2 mg by mouth every morning.   B COMPLEX VITAMINS TABLET    Take 1 tablet by mouth daily.   BIOTIN 5 MG CAPS    Take 5 mg by mouth daily.   BLOOD GLUCOSE MONITORING SUPPL  DEVI    1 each by Does not apply route in the morning, at noon, and at bedtime. May substitute to any manufacturer covered by patient's insurance.   CALCIUM  CITRATE (CITRACAL PO)    Take 2 tablets by mouth daily.   CARVEDILOL  (COREG ) 25 MG TABLET    Take 1 tablet (25 mg total) by mouth 2 (two) times daily.   CELECOXIB  (CELEBREX ) 200 MG CAPSULE    Take 200 mg by mouth 2 (two) times daily.   CHLORPHENIRAMINE-HYDROCODONE  (TUSSIONEX) 10-8 MG/5ML    SMARTSIG:1 teaspoon By Mouth Every 12 Hours PRN   CHOLECALCIFEROL  (VITAMIN D ) 50 MCG (2000 UT) TABLET    Take 2,000 Units by mouth daily.    DICYCLOMINE  (BENTYL ) 20 MG TABLET    Take 20 mg by mouth every 8 (eight) hours as needed for spasms.   DILTIAZEM  (CARDIZEM ) 30 MG TABLET    Take 30 mg by mouth every 4 (four) hours as needed (for HR>100 and BP>100).  DIPHENHYDRAMINE  (BENADRYL ) 25 MG TABLET    Take 25 mg by mouth every 6 (six) hours as needed for allergies.   DULOXETINE  (CYMBALTA ) 60 MG CAPSULE    Take 60 mg by mouth daily.   EMPAGLIFLOZIN  (JARDIANCE ) 10 MG TABS TABLET    Take 1 tablet (10 mg total) by mouth daily before breakfast.   EMPAGLIFLOZIN  (JARDIANCE ) 10 MG TABS TABLET    Take 1 tablet (10 mg total) by mouth daily before breakfast.   GLIPIZIDE  (GLUCOTROL  XL) 5 MG 24 HR TABLET    TAKE 1 TABLET BY MOUTH EVERY DAY WITH BREAKFAST   KLOR-CON  M20 20 MEQ TABLET    Take 20 mEq by mouth daily.   LOPERAMIDE  (IMODIUM ) 2 MG CAPSULE    Take 2-4 mg by mouth as needed for diarrhea or loose stools.   MAGNESIUM  OXIDE (MAG-OX) 400 (240 MG) MG TABLET    TAKE 1 TABLET BY MOUTH EVERY DAY   METHOCARBAMOL  (ROBAXIN ) 500 MG TABLET    Take 1 tablet (500 mg total) by mouth every 6 (six) hours as needed for muscle spasms.   MULTIPLE VITAMINS-MINERALS (MULTIVITAMIN ADULT PO)    Take 1 tablet by mouth daily.   OMEPRAZOLE (PRILOSEC) 20 MG CAPSULE    Take 20 mg by mouth daily.   OXYCODONE  (OXY IR/ROXICODONE ) 5 MG IMMEDIATE RELEASE TABLET    Take 1 tablet (5 mg total) by mouth  every 4 (four) hours as needed for moderate pain.   PREGABALIN  (LYRICA ) 75 MG CAPSULE    Take 75 mg by mouth 3 (three) times daily.   ROSUVASTATIN  (CRESTOR ) 20 MG TABLET    Take 20 mg by mouth daily after supper.   SUMATRIPTAN  (IMITREX ) 100 MG TABLET    Take 100 mg by mouth as needed for migraine.   TELMISARTAN (MICARDIS) 80 MG TABLET    Take 80 mg by mouth daily.   VENTOLIN  HFA 108 (90 BASE) MCG/ACT INHALER    Inhale 1-2 puffs into the lungs every 6 (six) hours as needed for wheezing or shortness of breath.   Modified Medications   No medications on file  Discontinued Medications   DULAGLUTIDE  (TRULICITY ) 1.5 MG/0.5ML SOAJ    INJECT 1.5 MG INTO THE SKIN ONCE A WEEK.    Allergies Allergies  Allergen Reactions   Penicillins     Joints ache   Aspirin Hives and Rash   Ibuprofen Hives and Rash   Naproxen Hives and Rash   Tape Rash    Adhesive tape causes rashes. Paper tape is okay to use    Past Medical History 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   Dysrhythmia    hx AF had ablation in 2012, has occ pvc's per pt   Fatty liver    Fibromyalgia    GERD (gastroesophageal reflux disease)    History of kidney stones  passed on own in past per pt   Hyperlipemia    Hypertension    IBS (irritable bowel syndrome)    migraines 12/09/2020   with weather changes per pt   PONV (postoperative nausea and vomiting)    Sciatica 12/09/2020   right butt cheek and right leg and  lower spine   Seasonal allergies    Shortness of breath 12/09/2020   on exertion   Sleep apnea    cpap broken waiting on new cpap, severe osa last sleep study home few months ago ( 12-09-2020 )   Uses walker 12/09/2020   left walker with friend out of the area   Wears glasses 12/09/2020    Past Surgical History Past Surgical History:  Procedure Laterality Date   BREAST LUMPECTOMY WITH AXILLARY LYMPH NODE DISSECTION Left 07/02/1993   12 nodes removed   CARDIAC CATHETERIZATION  01/24/2009   left heart   CARDIAC ELECTROPHYSIOLOGY MAPPING AND ABLATION  07/02/2010   CARPAL TUNNEL RELEASE Right 10/14/2012   Procedure: CARPAL TUNNEL RELEASE;  Surgeon: Kemp Patter, MD;  Location: [facility];  Service: Orthopedics;  Laterality: Right;   CHOLECYSTECTOMY N/A 12/15/2020   Procedure: LAPAROSCOPIC CHOLECYSTECTOMY WITH INTRAOPERATIVE CHOLANGIOGRAM, TAP BLOCK;  Surgeon: Candyce Champagne, MD;  Location: [facility];  Service: General;  Laterality: N/A;   COLONOSCOPY  2021   DIAGNOSTIC LAPAROSCOPY  07/02/1988   EP IMPLANTABLE DEVICE N/A 03/08/2016   Procedure: Loop Recorder Insertion;  Surgeon: Jolly Needle, MD;  Location: [facility];  Service: Cardiovascular;  Laterality: N/A;   LIVER BIOPSY N/A 12/15/2020   Procedure: NEEDLE CORE LIVER BIOPSY;  Surgeon: Candyce Champagne, MD;  Location: [facility];  Service: General;  Laterality: N/A;   LUMBAR FUSION  2011   MASS EXCISION N/A 12/15/2020   Procedure: EXCISION MASS ABDOMINAL WALL;  Surgeon: Candyce Champagne, MD;  Location: [facility] ;  Service: General;  Laterality: N/A;   TONSILLECTOMY     age 27 per pt    Family History family history includes Alcoholism in her brother, father, and mother; Anemia in her brother; Heart attack in her maternal grandmother; Heart disease in her brother; Hyperlipidemia in her brother and maternal grandmother; Hypertension in her brother, maternal  grandmother, and mother; Other in her brother; Stroke in her mother.  Social History Social History   Socioeconomic History   Marital status: Divorced    Spouse name: Not on file   Number of children: 0   Years of education: Not on file   Highest education level: Not on file  Occupational History   Not on file  Tobacco Use   Smoking status: Former    Current packs/day: 0.00    Average packs/day: 2.0 packs/day for 15.0 years (30.0 ttl pk-yrs)    Types: Cigarettes    Start date: 10/07/1964    Quit date: 10/08/1979    Years since quitting: 44.1   Smokeless tobacco: Never  Vaping Use   Vaping status: Never Used  Substance and Sexual Activity   Alcohol use: No    Alcohol/week: 0.0 standard drinks of alcohol   Drug use: No   Sexual activity: Not on file  Other Topics Concern   Not on file  Social History Narrative   Not on file   Social Drivers of Health   Financial Resource Strain: Not on file  Food Insecurity: Low Risk  (05/22/2023)   Received from [facility]   Hunger Vital Sign  Worried About Programme researcher, broadcasting/film/video in the Last Year: Never true    Ran Out of Food in the Last Year: Never true  Transportation Needs: No Transportation Needs (05/22/2023)   Received from [facility]    In the past 12 months, has lack of reliable transportation kept you from medical appointments, meetings, work or from getting things needed for daily living? : No  Physical Activity: Not on file  Stress: Not on file  Social Connections: Unknown (11/09/2022)   Received from [facility]   Social Network    Social Network: Not on file  Intimate Partner Violence: Unknown (11/09/2022)   Received from [facility], [facility]   HITS    Physically Hurt: Not on file    Insult or Talk Down To: Not on file    Threaten Physical Harm: Not on file    Scream or Curse: Not on file    Lab Results  Component Value Date   HGBA1C 7.0 (H) 11/06/2023   HGBA1C 6.1  (A) 07/16/2023   HGBA1C 6.3 (A) 04/15/2023   Lab Results  Component Value Date   CHOL 122 11/06/2023   Lab Results  Component Value Date   HDL 63 11/06/2023   Lab Results  Component Value Date   LDLCALC 39 11/06/2023   Lab Results  Component Value Date   TRIG 118 11/06/2023   Lab Results  Component Value Date   CHOLHDL 1.9 11/06/2023   Lab Results  Component Value Date   CREATININE 1.29 (H) 11/06/2023   No results found for: "GFR" Lab Results  Component Value Date   MICROALBUR <0.2 11/06/2023      Component Value Date/Time   NA 138 11/06/2023 1004   NA 138 10/03/2020 1511   K 4.4 11/06/2023 1004   CL 103 11/06/2023 1004   CO2 25 11/06/2023 1004   GLUCOSE 135 (H) 11/06/2023 1004   BUN 19 11/06/2023 1004   BUN 13 10/03/2020 1511   CREATININE 1.29 (H) 11/06/2023 1004   CALCIUM  9.8 11/06/2023 1004   PROT 7.0 11/06/2023 1004   ALBUMIN  3.8 01/08/2023 1600   AST 18 11/06/2023 1004   ALT 13 11/06/2023 1004   ALKPHOS 151 (H) 01/08/2023 1600   BILITOT 0.4 11/06/2023 1004   GFRNONAA 39 (L) 01/14/2023 2358   GFRAA >60 10/09/2017 1956      Latest Ref Rng & Units 11/06/2023   10:04 AM 01/14/2023   11:58 PM 01/08/2023    4:00 PM  BMP  Glucose 65 - 99 mg/dL 161  096  045   BUN 7 - 25 mg/dL 19  23  26    Creatinine 0.60 - 1.00 mg/dL 4.09  8.11  9.14   BUN/Creat Ratio 6 - 22 (calc) 15     Sodium 135 - 146 mmol/L 138  130  136   Potassium 3.5 - 5.3 mmol/L 4.4  4.5  4.1   Chloride 98 - 110 mmol/L 103  97  98   CO2 20 - 32 mmol/L 25  23  25    Calcium  8.6 - 10.4 mg/dL 9.8  8.7  9.8        Component Value Date/Time   WBC 10.2 01/14/2023 2358   RBC 3.52 (L) 01/14/2023 2358   HGB 9.9 (L) 01/14/2023 2358   HCT 31.2 (L) 01/14/2023 2358   PLT 157 01/14/2023 2358   MCV 88.6 01/14/2023 2358   MCH 28.1 01/14/2023 2358   MCHC 31.7 01/14/2023  2358   RDW 13.1 01/14/2023 2358   LYMPHSABS 1.7 12/03/2022 1830   MONOABS 0.7 12/03/2022 1830   EOSABS 0.1 12/03/2022 1830    BASOSABS 0.0 12/03/2022 1830     Parts of this note may have been dictated using voice recognition software. There may be variances in spelling and vocabulary which are unintentional. Not all errors are proofread. Please notify the [facility] if any discrepancies are noted or if the meaning of any statement is not clear.

## 2023-11-24 ENCOUNTER — Other Ambulatory Visit: Payer: Self-pay | Admitting: "Endocrinology

## 2023-11-26 NOTE — Telephone Encounter (Signed)
 Requested Prescriptions   Pending Prescriptions Disp Refills   JARDIANCE  10 MG TABS tablet [Pharmacy Med Name: JARDIANCE  10 MG TABLET] 30 tablet 0    Sig: TAKE 1 TABLET BY MOUTH DAILY BEFORE BREAKFAST.

## 2023-11-30 ENCOUNTER — Other Ambulatory Visit: Payer: Self-pay | Admitting: "Endocrinology

## 2023-12-02 NOTE — Telephone Encounter (Signed)
 Requested Prescriptions   Pending Prescriptions Disp Refills   glipiZIDE  (GLUCOTROL  XL) 5 MG 24 hr tablet [Pharmacy Med Name: GLIPIZIDE  ER 5 MG TABLET] 90 tablet 1    Sig: TAKE 1 TABLET BY MOUTH EVERY DAY WITH BREAKFAST

## 2024-01-17 ENCOUNTER — Other Ambulatory Visit: Payer: Self-pay | Admitting: "Endocrinology

## 2024-01-17 NOTE — Telephone Encounter (Signed)
 Requested Prescriptions   Pending Prescriptions Disp Refills   JARDIANCE  10 MG TABS tablet [Pharmacy Med Name: JARDIANCE  10 MG TABLET] 30 tablet 0    Sig: TAKE 1 TABLET BY MOUTH EVERY DAY BEFORE BREAKFAST

## 2024-01-18 ENCOUNTER — Other Ambulatory Visit: Payer: Self-pay | Admitting: "Endocrinology

## 2024-01-18 DIAGNOSIS — E1165 Type 2 diabetes mellitus with hyperglycemia: Secondary | ICD-10-CM

## 2024-01-31 ENCOUNTER — Ambulatory Visit (HOSPITAL_COMMUNITY)
Admission: RE | Admit: 2024-01-31 | Discharge: 2024-01-31 | Disposition: A | Discharge status: Home / Self Care | Source: Ambulatory Visit | Attending: Physician Assistant | Admitting: Physician Assistant

## 2024-01-31 ENCOUNTER — Encounter (HOSPITAL_COMMUNITY): Payer: Self-pay | Admitting: Physician Assistant

## 2024-01-31 VITALS — BP 116/50 | HR 79 | Ht 59.0 in | Wt 223.4 lb

## 2024-01-31 DIAGNOSIS — I480 Paroxysmal atrial fibrillation: Secondary | ICD-10-CM | POA: Diagnosis not present

## 2024-01-31 DIAGNOSIS — D6869 Other thrombophilia: Secondary | ICD-10-CM | POA: Diagnosis not present

## 2024-01-31 NOTE — Progress Notes (Signed)
 Primary Care Physician: Windy Coy, MD Primary Cardiologist: Lynwood Rakers, MD (Inactive) Electrophysiologist: Eulas FORBES Furbish, MD  Referring Physician: Dr Furbish   Katie Owens is a 73 y.o. female with a history of CKD, DM, OSA, HTN, HLD, atrial fibrillation who presents for follow up in the [facility].  The patient is s/p afib ablation by Dr Craig at [facility] in 2012. She has not had any known recurrence of afib since the procedure and is not currently on anticoagulation. ILR reached EOL in 2021.    Patient presents today for follow up for atrial fibrillation. She denies any interim symptoms of afib. She continues to use her CPAP religiously.   Today, she denies symptoms of palpitations, chest pain, shortness of breath, orthopnea, PND, lower extremity edema, dizziness, presyncope, syncope, bleeding, or neurologic sequela. The patient is tolerating medications without difficulties and is otherwise without complaint today.    Atrial Fibrillation Risk Factors:  she does have symptoms or diagnosis of sleep apnea. she does not have a history of rheumatic fever.   Atrial Fibrillation Management history:  Previous antiarrhythmic drugs: none Previous cardioversions: none Previous ablations: 2012 [facility] Anticoagulation history: Xarelto   ROS- All systems are reviewed and negative except as per the HPI above.  Past Medical History:  Diagnosis Date   Abdominal wall mass 12/09/2020   lipoma, s/p excision 12/15/20 at time of cholecystectomy   Arthritis 12/09/2020   oa   Asthma    Cancer (HCC) 07/02/1993   breast cancer lt with 12 nodes out, chemo and radiation done   Chronic cholecystitis 12/09/2020   s/p cholecystectomy 12/15/20   chronic kidney disease 12/09/2020   stage 3 per pt   Contact lens/glasses fitting    wears contacts or glasses   COVID    jan  2022 cold and headache x 7 -8 days/all symptoms resolved per pt   Depression    dm type 2  12/09/2020   Dysrhythmia    hx AF had ablation in 2012, has occ pvc's per pt   Fatty liver    Fibromyalgia    GERD (gastroesophageal reflux disease)    History of kidney stones    passed on own in past per pt   Hyperlipemia    Hypertension    IBS (irritable bowel syndrome)    migraines 12/09/2020   with weather changes per pt   PONV (postoperative nausea and vomiting)    Sciatica 12/09/2020   right butt cheek and right leg and lower spine   Seasonal allergies    Shortness of breath 12/09/2020   on exertion   Sleep apnea    cpap broken waiting on new cpap, severe osa last sleep study home few months ago ( 12-09-2020 )   Uses walker 12/09/2020   left walker with friend out of the area   Wears glasses 12/09/2020    Current Outpatient Medications  Medication Sig Dispense Refill   ACCU-CHEK GUIDE TEST test strip CHECK BLOOD SUGARS IN THE MORNING, AT NOON, AND AT BEDTIME.     ALPRAZolam  (XANAX ) 0.5 MG tablet Take 0.5 mg by mouth at bedtime as needed for sleep or anxiety.  1   ARIPiprazole  (ABILIFY ) 2 MG tablet Take 2 mg by mouth every morning.     b complex vitamins tablet Take 1 tablet by mouth daily.     Biotin 5 MG CAPS Take 5 mg by mouth daily.     Blood Glucose Monitoring Suppl DEVI 1 each  by Does not apply route in the morning, at noon, and at bedtime. May substitute to any manufacturer covered by patient's insurance. 1 each 0   Calcium  Citrate (CITRACAL PO) Take 2 tablets by mouth daily.     carvedilol  (COREG ) 25 MG tablet Take 1 tablet (25 mg total) by mouth 2 (two) times daily. 180 tablet 3   celecoxib  (CELEBREX ) 200 MG capsule Take 200 mg by mouth 2 (two) times daily.     Cholecalciferol  (VITAMIN D ) 50 MCG (2000 UT) tablet Take 2,000 Units by mouth daily.      dicyclomine  (BENTYL ) 20 MG tablet Take 20 mg by mouth every 8 (eight) hours as needed for spasms.     diltiazem  (CARDIZEM ) 30 MG tablet Take 30 mg by mouth every 4 (four) hours as needed (for HR>100 and BP>100).      diphenhydrAMINE  (BENADRYL ) 25 MG tablet Take 25 mg by mouth every 6 (six) hours as needed for allergies.     Dulaglutide  (TRULICITY ) 3 MG/0.5ML SOAJ Inject 3 mg as directed once a week. 2 mL 1   DULoxetine  (CYMBALTA ) 30 MG capsule Take 30 mg by mouth daily.     DULoxetine  (CYMBALTA ) 60 MG capsule Take 60 mg by mouth daily.     empagliflozin  (JARDIANCE ) 10 MG TABS tablet Take 1 tablet (10 mg total) by mouth daily before breakfast. 30 tablet 3   glipiZIDE  (GLUCOTROL  XL) 5 MG 24 hr tablet TAKE 1 TABLET BY MOUTH EVERY DAY WITH BREAKFAST 90 tablet 1   KLOR-CON  M20 20 MEQ tablet Take 20 mEq by mouth daily.     loperamide  (IMODIUM ) 2 MG capsule Take 2-4 mg by mouth as needed for diarrhea or loose stools.     magnesium  oxide (MAG-OX) 400 (240 Mg) MG tablet TAKE 1 TABLET BY MOUTH EVERY DAY 15 tablet 0   methocarbamol  (ROBAXIN ) 500 MG tablet Take 1 tablet (500 mg total) by mouth every 6 (six) hours as needed for muscle spasms. 40 tablet 2   Multiple Vitamins-Minerals (MULTIVITAMIN ADULT PO) Take 1 tablet by mouth daily.     omeprazole (PRILOSEC) 20 MG capsule Take 20 mg by mouth daily.     pregabalin  (LYRICA ) 75 MG capsule Take 75 mg by mouth 3 (three) times daily.     rosuvastatin  (CRESTOR ) 20 MG tablet Take 20 mg by mouth daily after supper.     SUMAtriptan  (IMITREX ) 100 MG tablet Take 100 mg by mouth as needed for migraine.     telmisartan (MICARDIS) 80 MG tablet Take 80 mg by mouth daily.     VENTOLIN  HFA 108 (90 BASE) MCG/ACT inhaler Inhale 1-2 puffs into the lungs every 6 (six) hours as needed for wheezing or shortness of breath.   3   No current facility-administered medications for this encounter.    Physical Exam: BP (!) 116/50   Pulse 79   Ht 4' 11 (1.499 m)   Wt 101.3 kg   BMI 45.12 kg/m   GEN: Well nourished, well developed in no acute distress CARDIAC: Regular rate and rhythm, no murmurs, rubs, gallops RESPIRATORY:  Clear to auscultation without rales, wheezing or rhonchi   ABDOMEN: Soft, non-tender, non-distended EXTREMITIES:  No edema; No deformity   Wt Readings from Last 3 Encounters:  01/31/24 101.3 kg  11/12/23 100.7 kg  07/16/23 100.7 kg     EKG today demonstrates  SR Vent. rate 79 BPM PR interval 170 ms QRS duration 88 ms QT/QTcB 372/426 ms  Echo 10/03/20 demonstrated  1. Left ventricular ejection fraction, by estimation, is 50 to 55%. The  left ventricle has low normal function. The left ventricle has no regional  wall motion abnormalities. Left ventricular diastolic parameters are  indeterminate.   2. Right ventricular systolic function is normal. The right ventricular  size is normal. Mildly increased right ventricular wall thickness.   3. The mitral valve is normal in structure. Trivial mitral valve  regurgitation.   4. The aortic valve is normal in structure. Aortic valve regurgitation is  not visualized.   5. The inferior vena cava is normal in size with greater than 50%  respiratory variability, suggesting right atrial pressure of 3 mmHg.    CHA2DS2-VASc Score = 4  The patient's score is based upon: CHF History: 0 HTN History: 1 Diabetes History: 1 Stroke History: 0 Vascular Disease History: 0 Age Score: 1 Gender Score: 1       ASSESSMENT AND PLAN: Paroxysmal Atrial Fibrillation (ICD10:  I48.0) The patient's CHA2DS2-VASc score is 4, indicating a 4.8% annual risk of stroke.   S/p afib ablation 2012 at [facility] Has not had any documented recurrence of afib and is not on anticoagulation.  Continue carvedilol  25 mg BID  Secondary Hypercoagulable State (ICD10:  D68.69) The patient is at significant risk for stroke/thromboembolism based upon her CHA2DS2-VASc Score of 4.  If she has documented recurrence of afib, would need to resume anticoagulation.    OSA  Encouraged nightly CPAP  HTN Stable on current regimen    Follow up with Dr Nancey in one year.        [facility] [address] [place], [place] [address] [phone]

## 2024-02-13 ENCOUNTER — Inpatient Hospital Stay (HOSPITAL_COMMUNITY)
Admission: RE | Admit: 2024-02-13 | Discharge: 2024-02-13 | Disposition: A | Discharge status: Home / Self Care | Source: Ambulatory Visit | Attending: Physician Assistant | Admitting: Physician Assistant

## 2024-02-13 ENCOUNTER — Telehealth (HOSPITAL_COMMUNITY): Payer: Self-pay | Admitting: *Deleted

## 2024-02-13 DIAGNOSIS — I480 Paroxysmal atrial fibrillation: Secondary | ICD-10-CM

## 2024-02-13 NOTE — Telephone Encounter (Signed)
 Patient called today stating on Saturday she had an episode while walking to her car from the porch where she developed heart pounding/racing and felt faint - she is unsure if it was afib - she stated it last 1-3 minutes at most. Once she sat down in her car the symptoms of feeling faint subsided. She denied any precipitating factors that she knows of that would have caused her symptoms. When she got home her BP was elevated. She was wondering if she needed to wear a monitor to ensure she isn't having afib.  Per Daril Kicks PA I think a 2 week monitor would be reasonable since she is not on anticoagulation anymore.

## 2024-02-13 NOTE — Telephone Encounter (Signed)
 Pt prefers to be mailed monitor. Order placed.

## 2024-02-14 ENCOUNTER — Other Ambulatory Visit: Payer: Self-pay | Admitting: "Endocrinology

## 2024-02-16 ENCOUNTER — Other Ambulatory Visit: Payer: Self-pay | Admitting: "Endocrinology

## 2024-02-16 DIAGNOSIS — E1165 Type 2 diabetes mellitus with hyperglycemia: Secondary | ICD-10-CM

## 2024-02-17 ENCOUNTER — Other Ambulatory Visit: Payer: Self-pay

## 2024-02-17 MED ORDER — ACCU-CHEK GUIDE TEST VI STRP: 1.0000 | ORAL_STRIP | Freq: Every day | 1 refills | Status: AC

## 2024-02-25 ENCOUNTER — Other Ambulatory Visit (HOSPITAL_COMMUNITY): Payer: Self-pay

## 2024-02-25 ENCOUNTER — Telehealth: Payer: Self-pay

## 2024-02-25 ENCOUNTER — Encounter: Payer: Self-pay | Admitting: "Endocrinology

## 2024-02-25 ENCOUNTER — Ambulatory Visit: Admitting: "Endocrinology

## 2024-02-25 VITALS — BP 118/70 | HR 84 | Ht 59.0 in | Wt 225.0 lb

## 2024-02-25 DIAGNOSIS — E1165 Type 2 diabetes mellitus with hyperglycemia: Secondary | ICD-10-CM | POA: Diagnosis not present

## 2024-02-25 DIAGNOSIS — E7800 Pure hypercholesterolemia, unspecified: Secondary | ICD-10-CM | POA: Diagnosis not present

## 2024-02-25 DIAGNOSIS — Z7984 Long term (current) use of oral hypoglycemic drugs: Secondary | ICD-10-CM | POA: Diagnosis not present

## 2024-02-25 DIAGNOSIS — Z7985 Long-term (current) use of injectable non-insulin antidiabetic drugs: Secondary | ICD-10-CM | POA: Diagnosis not present

## 2024-02-25 LAB — POCT GLYCOSYLATED HEMOGLOBIN (HGB A1C): Hemoglobin A1C: 6.6 % — AB (ref 4.0–5.6)

## 2024-02-25 MED ORDER — EMPAGLIFLOZIN 25 MG PO TABS: 25.0000 mg | ORAL_TABLET | Freq: Every day | ORAL | 1 refills | Status: AC

## 2024-02-25 MED ORDER — TIRZEPATIDE 5 MG/0.5ML ~~LOC~~ SOAJ
5.0000 mg | SUBCUTANEOUS | 0 refills | Status: DC
Start: 2024-02-25 — End: 2024-04-24

## 2024-02-25 NOTE — Patient Instructions (Signed)

## 2024-02-25 NOTE — Telephone Encounter (Signed)
 Pharmacy Patient Advocate Encounter   Received notification from CoverMyMeds that prior authorization for Mounjaro  5MG /0.5ML auto-injectors is required/requested.   Insurance verification completed.   The patient is insured through [employer] .   Per test claim: PA required; PA submitted to above mentioned insurance via Latent Key/confirmation #/EOC [id number] Status is pending

## 2024-02-25 NOTE — Progress Notes (Signed)
 Outpatient Endocrinology Note Katie Birmingham, MD  02/25/24   Katie Owens August 20, 1950 [id number]  Referring Provider: Windy Coy, MD Primary Care Provider: Windy Coy, MD Reason for consultation: Subjective   Assessment & Plan  Diagnoses and all orders for this visit:  Uncontrolled type 2 diabetes mellitus with hyperglycemia (HCC) -     POCT glycosylated hemoglobin (Hb A1C)  Long term (current) use of oral hypoglycemic drugs  Long-term (current) use of injectable non-insulin  antidiabetic drugs  Pure hypercholesterolemia  Other orders -     tirzepatide  (MOUNJARO ) 5 MG/0.5ML Pen; Inject 5 mg into the skin once a week. -     empagliflozin  (JARDIANCE ) 25 MG TABS tablet; Take 1 tablet (25 mg total) by mouth daily before breakfast.   Diabetes complicated by neuropathy, nephropathy  Hba1c goal less than 7.0, current Hba1c is  Lab Results  Component Value Date   HGBA1C 6.6 (A) 02/25/2024   HGBA1C 7.0 (H) 11/06/2023   HGBA1C 6.1 (A) 07/16/2023    Will recommend for the following change of medications to: Jardiance  25 mg every day Start mounjaro  5 mg/weekly stop Trulicity  3 mg weekly due to lack of weight loss, if continued the same response will replace it with mounjaro  in future)  Stopped metformin XR 500 mg twice daily due to renal dysfunction  Stopped glipizide  XL 5 mg daily (skip if BG is less than 100) with  Patient previously tolerated Ozempic  well, unable to afford now  No known contraindications to any of above medications No history of MEN syndrome/medullary thyroid cancer/pancreatitis or pancreatic cancer in self or family Discussed S/E of jardiance   Hyperlipidemia -Last LDL off goal: 90 on rosuvastatin  10 mg every day -Continue rosuvastatin  20 mg every day, LDL 39 -Follow low fat diet and exercise   -Blood pressure goal <140/90 - Microalbumin/creatinine goal < 30, GFR low - on ACE/ARB Telmisartan 80 mg qd: defer to nephrology  -diet  changes including salt restriction -limit eating outside -counseled BP targets per standards of diabetes care -Uncontrolled blood pressure can lead to retinopathy, nephropathy and cardiovascular and atherosclerotic heart disease  Reviewed and counseled on: -A1C target -Blood sugar targets -Complications of uncontrolled diabetes  -Checking blood sugar before meals and bedtime and bring log next visit -All medications with mechanism of action and side effects -Hypoglycemia management: rule of 15's, Glucagon Emergency Kit and medical alert ID -low-carb low-fat plate-method diet -At least 20 minutes of physical activity per day -Annual dilated retinal eye exam and foot exam -compliance and follow up needs -follow up as scheduled or earlier if problem gets worse  Call if blood sugar is less than 70 or consistently above 250    Take a 15 gm snack of carbohydrate at bedtime before you go to sleep if your blood sugar is less than 100.    If you are going to fast after midnight for a test or procedure, ask your physician for instructions on how to reduce/decrease your insulin  dose.    Call if blood sugar is less than 70 or consistently above 250  -Treating a low sugar by rule of 15  (15 gms of sugar every 15 min until sugar is more than 70) If you feel your sugar is low, test your sugar to be sure If your sugar is low (less than 70), then take 15 grams of a fast acting Carbohydrate (3-4 glucose tablets or glucose gel or 4 ounces of juice or regular soda) Recheck your sugar 15 min after treating  low to make sure it is more than 70 If sugar is still less than 70, treat again with 15 grams of carbohydrate          Don't drive the hour of hypoglycemia  If unconscious/unable to eat or drink by mouth, use glucagon injection or nasal spray baqsimi and call 911. Can repeat again in 15 min if still unconscious.  Return in about 3 months (around 05/27/2024).   I have reviewed current medications,  nurse's notes, allergies, vital signs, past medical and surgical history, family medical history, and social history for this encounter. Counseled patient on symptoms, examination findings, lab findings, imaging results, treatment decisions and monitoring and prognosis. The patient understood the recommendations and agrees with the treatment plan. All questions regarding treatment plan were fully answered.  Katie Birmingham, MD  02/25/24    History of Present Illness Katie Owens is a 73 y.o. year old female who presents for follow up of Type 2 diabetes mellitus.  Katie Owens was first diagnosed in 2014.   Diabetes education +  Home diabetes regimen: Trulicity  3 mg weekly Jardiance  10 mg every day  Stopped Glipizide  XL 5 mg daily  Stopped Metformin ER 500 mg bid due to renal dysfunction (1000 bid lead to diarrhea)  Cannot afford ozempic  - no issues   COMPLICATIONS -  MI/Stroke -  retinopathy +  neuropathy +  nephropathy  BLOOD SUGAR DATA Did not bring meter Checks about once daily/every other day Range per recall: 106-217  11/22/2022 AST 36 ALT 25 Cr 1.2 GFR 47 LDL 31 Tg 124  Physical Exam BP 118/70   Pulse 84   Ht 4' 11 (1.499 m)   Wt 225 lb (102.1 kg)   SpO2 98%   BMI 45.44 kg/m    Constitutional: well developed, well nourished Head: normocephalic, atraumatic Eyes: sclera anicteric, no redness Neck: supple Lungs: normal respiratory effort Neurology: alert and oriented Skin: dry, no appreciable rashes Musculoskeletal: no appreciable defects Psychiatric: normal mood and affect Diabetic Foot Exam - Simple   No data filed      Current Medications Patient's Medications  New Prescriptions   EMPAGLIFLOZIN  (JARDIANCE ) 25 MG TABS TABLET    Take 1 tablet (25 mg total) by mouth daily before breakfast.   TIRZEPATIDE  (MOUNJARO ) 5 MG/0.5ML PEN    Inject 5 mg into the skin once a week.  Previous Medications   ALPRAZOLAM  (XANAX ) 0.5 MG TABLET    Take 0.5 mg by  mouth at bedtime as needed for sleep or anxiety.   ARIPIPRAZOLE  (ABILIFY ) 2 MG TABLET    Take 2 mg by mouth every morning.   B COMPLEX VITAMINS TABLET    Take 1 tablet by mouth daily.   BIOTIN 5 MG CAPS    Take 5 mg by mouth daily.   BLOOD GLUCOSE MONITORING SUPPL DEVI    1 each by Does not apply route in the morning, at noon, and at bedtime. May substitute to any manufacturer covered by patient's insurance.   CALCIUM  CITRATE (CITRACAL PO)    Take 2 tablets by mouth daily.   CARVEDILOL  (COREG ) 25 MG TABLET    Take 1 tablet (25 mg total) by mouth 2 (two) times daily.   CELECOXIB  (CELEBREX ) 200 MG CAPSULE    Take 200 mg by mouth 2 (two) times daily.   CHOLECALCIFEROL  (VITAMIN D ) 50 MCG (2000 UT) TABLET    Take 2,000 Units by mouth daily.    DICYCLOMINE  (BENTYL ) 20 MG TABLET  Take 20 mg by mouth every 8 (eight) hours as needed for spasms.   DILTIAZEM  (CARDIZEM ) 30 MG TABLET    Take 30 mg by mouth every 4 (four) hours as needed (for HR>100 and BP>100).   DIPHENHYDRAMINE  (BENADRYL ) 25 MG TABLET    Take 25 mg by mouth every 6 (six) hours as needed for allergies.   DULOXETINE  (CYMBALTA ) 30 MG CAPSULE    Take 30 mg by mouth daily.   DULOXETINE  (CYMBALTA ) 60 MG CAPSULE    Take 60 mg by mouth daily.   FINASTERIDE (PROSCAR) 5 MG TABLET    SMARTSIG:0.25 Tablet(s) By Mouth Daily   GLIPIZIDE  (GLUCOTROL  XL) 5 MG 24 HR TABLET    TAKE 1 TABLET BY MOUTH EVERY DAY WITH BREAKFAST   GLUCOSE BLOOD (ACCU-CHEK GUIDE TEST) TEST STRIP    1 each by Other route daily. Use as instructed   KLOR-CON  M20 20 MEQ TABLET    Take 20 mEq by mouth daily.   LOPERAMIDE  (IMODIUM ) 2 MG CAPSULE    Take 2-4 mg by mouth as needed for diarrhea or loose stools.   MAGNESIUM  OXIDE (MAG-OX) 400 (240 MG) MG TABLET    TAKE 1 TABLET BY MOUTH EVERY DAY   METHOCARBAMOL  (ROBAXIN ) 500 MG TABLET    Take 1 tablet (500 mg total) by mouth every 6 (six) hours as needed for muscle spasms.   MULTIPLE VITAMINS-MINERALS (MULTIVITAMIN ADULT PO)    Take 1  tablet by mouth daily.   OMEPRAZOLE (PRILOSEC) 20 MG CAPSULE    Take 20 mg by mouth daily.   PREGABALIN  (LYRICA ) 75 MG CAPSULE    Take 75 mg by mouth 3 (three) times daily.   ROSUVASTATIN  (CRESTOR ) 20 MG TABLET    Take 20 mg by mouth daily after supper.   SUMATRIPTAN  (IMITREX ) 100 MG TABLET    Take 100 mg by mouth as needed for migraine.   TELMISARTAN (MICARDIS) 80 MG TABLET    Take 80 mg by mouth daily.   VENTOLIN  HFA 108 (90 BASE) MCG/ACT INHALER    Inhale 1-2 puffs into the lungs every 6 (six) hours as needed for wheezing or shortness of breath.   Modified Medications   No medications on file  Discontinued Medications   DULAGLUTIDE  (TRULICITY ) 3 MG/0.5ML SOAJ    INJECT 3 MG AS DIRECTED ONCE A WEEK.   JARDIANCE  10 MG TABS TABLET    TAKE 1 TABLET BY MOUTH EVERY DAY BEFORE BREAKFAST    Allergies Allergies  Allergen Reactions   Penicillins     Joints ache   Aspirin Hives and Rash   Ibuprofen Hives and Rash   Naproxen Hives and Rash   Tape Rash    Adhesive tape causes rashes. Paper tape is okay to use    Past Medical History 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   Dysrhythmia  hx AF had ablation in 2012, has occ pvc's per pt   Fatty liver    Fibromyalgia    GERD (gastroesophageal reflux disease)    History of kidney stones    passed on own in past per pt   Hyperlipemia    Hypertension    IBS (irritable bowel syndrome)    migraines 12/09/2020   with weather  changes per pt   PONV (postoperative nausea and vomiting)    Sciatica 12/09/2020   right butt cheek and right leg and lower spine   Seasonal allergies    Shortness of breath 12/09/2020   on exertion   Sleep apnea    cpap broken waiting on new cpap, severe osa last sleep study home few months ago ( 12-09-2020 )   Uses walker 12/09/2020   left walker with friend out of the area   Wears glasses 12/09/2020    Past Surgical History Past Surgical History:  Procedure Laterality Date   BREAST LUMPECTOMY WITH AXILLARY LYMPH NODE DISSECTION Left 07/02/1993   12 nodes removed   CARDIAC CATHETERIZATION  01/24/2009   left heart   CARDIAC ELECTROPHYSIOLOGY MAPPING AND ABLATION  07/02/2010   CARPAL TUNNEL RELEASE Right 10/14/2012   Procedure: CARPAL TUNNEL RELEASE;  Surgeon: Arley JONELLE Curia, MD;  Location: [facility];  Service: Orthopedics;  Laterality: Right;   CHOLECYSTECTOMY N/A 12/15/2020   Procedure: LAPAROSCOPIC CHOLECYSTECTOMY WITH INTRAOPERATIVE CHOLANGIOGRAM, TAP BLOCK;  Surgeon: Sheldon Standing, MD;  Location[facility];  Service: General;  Laterality: N/A;   COLONOSCOPY  2021   DIAGNOSTIC LAPAROSCOPY  07/02/1988   EP IMPLANTABLE DEVICE N/A 03/08/2016   Procedure: Loop Recorder Insertion;  Surgeon: Lynwood Rakers, MD;  Location: [facility];  Service: Cardiovascular;  Laterality: N/A;   LIVER BIOPSY N/A 12/15/2020   Procedure: NEEDLE CORE LIVER BIOPSY;  Surgeon: Sheldon Standing, MD;  Location: [facility];  Service: General;  Laterality: N/A;   LUMBAR FUSION  2011   MASS EXCISION N/A 12/15/2020   Procedure: EXCISION MASS ABDOMINAL WALL;  Surgeon: Sheldon Standing, MD;  Location: [facility];  Service: General;  Laterality: N/A;   TONSILLECTOMY     age 55 per pt    Family History family history includes Alcoholism in her brother, father, and mother; Anemia in her brother; Heart attack in her maternal grandmother; Heart disease  in her brother; Hyperlipidemia in her brother and maternal grandmother; Hypertension in her brother, maternal grandmother, and mother; Other in her brother; Stroke in her mother.  Social History Social History   Socioeconomic History   Marital status: Divorced    Spouse name: Not on file   Number of children: 0   Years of education: Not on file   Highest education level: Not on file  Occupational History   Not on file  Tobacco Use   Smoking status: Former    Current packs/day: 0.00    Average packs/day: 2.0 packs/day for 15.0 years (30.0 ttl pk-yrs)    Types: Cigarettes    Start date: 10/07/1964    Quit date: 10/08/1979    Years since quitting: 44.4   Smokeless tobacco: Never   Tobacco comments:    Former smoker 01/31/24  Vaping Use   Vaping status: Never Used  Substance and Sexual Activity   Alcohol use: No    Alcohol/week: 0.0 standard drinks of alcohol   Drug use: No   Sexual activity: Not on file  Other Topics Concern  Not on file  Social History Narrative   Not on file   Social Drivers of Health   Financial Resource Strain: Not on file  Food Insecurity: Low Risk  (05/22/2023)   Received from [facility]   Hunger Vital Sign    Within the past 12 months, you worried that your food would run out before you got money to buy more: Never true    Within the past 12 months, the food you bought just didn't last and you didn't have money to get more. : Never true  Transportation Needs: No Transportation Needs (05/22/2023)   Received from [facility]    In the past 12 months, has lack of reliable transportation kept you from medical appointments, meetings, work or from getting things needed for daily living? : No  Physical Activity: Not on file  Stress: Not on file  Social Connections: Unknown (11/09/2022)   Received from [employer]   Social Network    Social Network: Not on file  Intimate Partner Violence: Unknown (11/09/2022)   Received from  [facility]   HITS    Physically Hurt: Not on file    Insult or Talk Down To: Not on file    Threaten Physical Harm: Not on file    Scream or Curse: Not on file    Lab Results  Component Value Date   HGBA1C 6.6 (A) 02/25/2024   HGBA1C 7.0 (H) 11/06/2023   HGBA1C 6.1 (A) 07/16/2023   Lab Results  Component Value Date   CHOL 122 11/06/2023   Lab Results  Component Value Date   HDL 63 11/06/2023   Lab Results  Component Value Date   LDLCALC 39 11/06/2023   Lab Results  Component Value Date   TRIG 118 11/06/2023   Lab Results  Component Value Date   CHOLHDL 1.9 11/06/2023   Lab Results  Component Value Date   CREATININE 1.29 (H) 11/06/2023   No results found for: GFR Lab Results  Component Value Date   MICROALBUR <0.2 11/06/2023      Component Value Date/Time   NA 138 11/06/2023 1004   NA 138 10/03/2020 1511   K 4.4 11/06/2023 1004   CL 103 11/06/2023 1004   CO2 25 11/06/2023 1004   GLUCOSE 135 (H) 11/06/2023 1004   BUN 19 11/06/2023 1004   BUN 13 10/03/2020 1511   CREATININE 1.29 (H) 11/06/2023 1004   CALCIUM  9.8 11/06/2023 1004   PROT 7.0 11/06/2023 1004   ALBUMIN  3.8 01/08/2023 1600   AST 18 11/06/2023 1004   ALT 13 11/06/2023 1004   ALKPHOS 151 (H) 01/08/2023 1600   BILITOT 0.4 11/06/2023 1004   GFRNONAA 39 (L) 01/14/2023 2358   GFRAA >60 10/09/2017 1956      Latest Ref Rng & Units 11/06/2023   10:04 AM 01/14/2023   11:58 PM 01/08/2023    4:00 PM  BMP  Glucose 65 - 99 mg/dL 864  643  884   BUN 7 - 25 mg/dL 19  23  26    Creatinine 0.60 - 1.00 mg/dL 8.70  8.57  8.69   BUN/Creat Ratio 6 - 22 (calc) 15     Sodium 135 - 146 mmol/L 138  130  136   Potassium 3.5 - 5.3 mmol/L 4.4  4.5  4.1   Chloride 98 - 110 mmol/L 103  97  98   CO2 20 - 32 mmol/L 25  23  25    Calcium  8.6 - 10.4  mg/dL 9.8  8.7  9.8        Component Value Date/Time   WBC 10.2 01/14/2023 2358   RBC 3.52 (L) 01/14/2023 2358   HGB 9.9 (L) 01/14/2023 2358   HCT 31.2 (L)  01/14/2023 2358   PLT 157 01/14/2023 2358   MCV 88.6 01/14/2023 2358   MCH 28.1 01/14/2023 2358   MCHC 31.7 01/14/2023 2358   RDW 13.1 01/14/2023 2358   LYMPHSABS 1.7 12/03/2022 1830   MONOABS 0.7 12/03/2022 1830   EOSABS 0.1 12/03/2022 1830   BASOSABS 0.0 12/03/2022 1830     Parts of this note may have been dictated using voice recognition software. There may be variances in spelling and vocabulary which are unintentional. Not all errors are proofread. Please notify the dino if any discrepancies are noted or if the meaning of any statement is not clear.

## 2024-02-27 NOTE — Telephone Encounter (Signed)
 Additional information has been requested from the patient's insurance in order to proceed with the prior authorization request. Requested information has been sent, or form has been filled out and faxed back to [facility] via [employer]

## 2024-03-05 NOTE — Addendum Note (Signed)
 Encounter addended by: Franchot Glade RAMAN, RN on: 03/05/2024 1:24 PM  Actions taken: Imaging Exam ended

## 2024-03-09 ENCOUNTER — Ambulatory Visit (HOSPITAL_COMMUNITY): Payer: Self-pay | Admitting: Physician Assistant

## 2024-03-12 NOTE — Telephone Encounter (Signed)
 Pharmacy Patient Advocate Encounter  Received notification from [employer] that Prior Authorization for Mounjaro  5MG /0.5ML auto-injectors  has been APPROVED from 02/27/24 to 07/01/24

## 2024-04-08 ENCOUNTER — Ambulatory Visit (INDEPENDENT_AMBULATORY_CARE_PROVIDER_SITE_OTHER): Admitting: Orthopaedic Surgery

## 2024-04-08 ENCOUNTER — Other Ambulatory Visit: Payer: Self-pay

## 2024-04-08 VITALS — Ht 59.0 in | Wt 221.0 lb

## 2024-04-08 DIAGNOSIS — M25552 Pain in left hip: Secondary | ICD-10-CM | POA: Diagnosis not present

## 2024-04-08 MED ORDER — LIDOCAINE HCL 1 % IJ SOLN
3.0000 mL | INTRAMUSCULAR | Status: AC | PRN
  Administered 2024-04-08: 3 mL

## 2024-04-08 MED ORDER — METHYLPREDNISOLONE ACETATE 40 MG/ML IJ SUSP
40.0000 mg | INTRAMUSCULAR | Status: AC | PRN
  Administered 2024-04-08: 40 mg via INTRA_ARTICULAR

## 2024-04-08 NOTE — Progress Notes (Signed)
 Patient is a very pleasant 73 year old female who comes in with left hip pain for about 2 years now.  She points to the lateral aspect of her left hip as a source of her pain and when she was crossing her legs a few years ago about 2 years ago she felt a pop in this area and has been hurting since then.  She has had multiple lumbar spine surgeries that have been quite significant.  She denies any groin pain.  She does ambulate with a rolling walker.  On exam I can put her left hip easily through internal and external rotation with no blocks to rotation and no pain in the groin at all.  However there is pain to palpation over the trochanteric area and the proximal IT band.  She has a little bit of this on her other side.  Of note her BMI is 44.64.  She is diabetic but reports that hemoglobin A1c in the mid 6 range.  I did explain to her the anatomy of her hip.  We did obtain an AP pelvis and lateral of the left hip and the joint spaces well-maintained with no cortical irregularities around the trochanteric areas.  Her signs and symptoms are more consistent with hip bursitis and tendinitis.  I did recommend a steroid injection over her left hip trochanteric area which she agreed to and tolerated well.  She understands that she will watch her blood glucose closely feels this can elevate her blood glucose.  Follow-up can be as needed but if things worsen our next step would be obtaining an MRI of this area of her left hip to rule out a tendon or ligament tear given what she has experienced.  She agrees with this treatment plan.    Procedure Note  Patient: Katie Owens             Date of Birth: May 27, 1951           MRN: [id number]             Visit Date: 04/08/2024  Procedures: Visit Diagnoses:  1. Pain of left hip     Large Joint Inj: L greater trochanter on 04/08/2024 4:14 PM Indications: pain and diagnostic evaluation Details: 22 G 1.5 in needle, lateral approach  Arthrogram:  No  Medications: 3 mL lidocaine  1 %; 40 mg methylPREDNISolone acetate 40 MG/ML Outcome: tolerated well, no immediate complications Procedure, treatment alternatives, risks and benefits explained, specific risks discussed. Consent was given by the patient. Immediately prior to procedure a time out was called to verify the correct patient, procedure, equipment, support staff and site/side marked as required. Patient was prepped and draped in the usual sterile fashion.

## 2024-04-09 ENCOUNTER — Telehealth: Payer: Self-pay | Admitting: Radiology

## 2024-04-09 NOTE — Telephone Encounter (Signed)
 Patient aware of the below message

## 2024-04-24 ENCOUNTER — Other Ambulatory Visit: Payer: Self-pay | Admitting: "Endocrinology

## 2024-04-24 DIAGNOSIS — E1165 Type 2 diabetes mellitus with hyperglycemia: Secondary | ICD-10-CM

## 2024-05-04 ENCOUNTER — Telehealth: Payer: Self-pay

## 2024-05-04 ENCOUNTER — Encounter: Payer: Self-pay | Admitting: Radiology

## 2024-05-04 NOTE — Telephone Encounter (Signed)
 Pt called to report reaction to jardiance , nausea, shakes, chills and anxiety and diarrhea. Pt has stopped taking medication.

## 2024-05-26 ENCOUNTER — Ambulatory Visit: Admitting: "Endocrinology

## 2024-06-16 ENCOUNTER — Other Ambulatory Visit: Payer: Self-pay | Admitting: "Endocrinology

## 2024-07-22 ENCOUNTER — Encounter (HOSPITAL_BASED_OUTPATIENT_CLINIC_OR_DEPARTMENT_OTHER): Payer: Self-pay

## 2024-07-22 ENCOUNTER — Ambulatory Visit (HOSPITAL_BASED_OUTPATIENT_CLINIC_OR_DEPARTMENT_OTHER)

## 2024-07-22 VITALS — BP 138/70 | HR 69 | Ht 59.0 in | Wt 218.0 lb

## 2024-07-22 DIAGNOSIS — Z87891 Personal history of nicotine dependence: Secondary | ICD-10-CM | POA: Diagnosis not present

## 2024-07-22 DIAGNOSIS — G4733 Obstructive sleep apnea (adult) (pediatric): Secondary | ICD-10-CM | POA: Diagnosis not present

## 2024-07-22 DIAGNOSIS — R053 Chronic cough: Secondary | ICD-10-CM | POA: Diagnosis not present

## 2024-07-22 NOTE — Progress Notes (Signed)
 "  @Patient  ID: Katie Owens, female    DOB: 1951-06-05, 74 y.o.   MRN: [id number]  Chief Complaint  Patient presents with   Follow-up   Sleep Apnea    Referring provider: Windy Coy, MD  HPI: Discussed the use of AI scribe software for clinical note transcription with the patient, who gave verbal consent to proceed.  History of Present Illness Katie Owens is a 74 year old female with obstructive sleep apnea who presents for a one-year follow-up visit. She was referred by Dr. Burnard, her cardiologist, for her initial sleep study due to atrial fibrillation.  In August, she experienced an illness characterized by nausea and body aches, which led to a week-long discontinuation of her CPAP therapy. She initially suspected influenza, but the symptoms did not progress further. During this period, she did not experience any pulmonary complications.  She consistently uses her CPAP machine, averaging nine hours of use per night. Her compliance data indicates residual AHI of 0.9/hr.  Her leak profile is elevated on the compliance download. She experiences mask leaks around her chin which causes some discomfort before sleep, but finds it tolerable once asleep. She has experimented with different masks and finds the current one, despite the leaks, to be the best fit.  She has albuterol  prescribed but uses it infrequently.  No significant weight changes have been noted, with a possible five-pound loss over the past year.  Last OV 03/19/2023: 74 yo retiree for FU of OSA & delayed sleep phase syndrome PMH -atrial fibrillation p-ablation, mild intermittent asthma, hypertension, breast cancer C-spine hardware   2-year follow-up visit.   She underwent back surgery few months ago and has stopped using her CPAP machine since March.  She reports residue on her humidifier.  She replaced this and even the new 1 had a similar problem she wonders if something is wrong with her CPAP machine. She  also reports an occasional daily cough especially when she breathes.  Cold air, she develops paroxysms and a tickle in her throat. PCP gave her Qvar  which she admits to not using and PCP told she can stop using. He also gave her albuterol  MDI which he used with a spacer but this made her cough worse.   CT angiogram chest 12/2022 was reviewed which did not show any lung pathology, liver appeared nodular   CPAP download shows good compliance in February and March on auto settings 10 to 16 cm with average pressure of 13 and maximum pressure of 14 cm with no residual events and mild leak  Significant tests/ events reviewed   07/2020 HST AHI 19/hour, lowest saturation 78% PSG 05/01/15 >> AHI 30.2, SpO2 low 86%   05/2009 CPAP titration-266 pounds-16 cm Previous sleep study showed AHI of 28    Allergies[1]  Immunization History  Administered Date(s) Administered   Fluzone Influenza virus vaccine,trivalent (IIV3), split virus 04/04/2015   INFLUENZA, HIGH DOSE SEASONAL PF 07/22/2018   Influenza,inj,Quad PF,6+ Mos 04/04/2015   PFIZER(Purple Top)SARS-COV-2 Vaccination 08/13/2019, 09/05/2019   Tdap 03/26/2018    Past Medical History:  Diagnosis Date   Abdominal wall mass 12/09/2020   lipoma, s/p excision 12/15/20 at time of cholecystectomy   Arthritis 12/09/2020   oa   Asthma    Cancer (HCC) 07/02/1993   breast cancer lt with 12 nodes out, chemo and radiation done   Chronic cholecystitis 12/09/2020   s/p cholecystectomy 12/15/20   chronic kidney disease 12/09/2020   stage 3 per pt  Contact lens/glasses fitting    wears contacts or glasses   COVID    jan  2022 cold and headache x 7 -8 days/all symptoms resolved per pt   Depression    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   Dysrhythmia    hx AF had ablation in 2012, has occ pvc's per pt   Fatty liver    Fibromyalgia    GERD (gastroesophageal reflux disease)    History of kidney stones    passed on own in past per pt   Hyperlipemia     Hypertension    IBS (irritable bowel syndrome)    migraines 12/09/2020   with weather changes per pt   PONV (postoperative nausea and vomiting)    Sciatica 12/09/2020   right butt cheek and right leg and lower spine   Seasonal allergies    Shortness of breath 12/09/2020   on exertion   Sleep apnea    cpap broken waiting on new cpap, severe osa last sleep study home few months ago ( 12-09-2020 )   Uses walker 12/09/2020   left walker with friend out of the area   Wears glasses 12/09/2020    Tobacco History: Tobacco Use History[2] Counseling given: Not Answered Tobacco comments: Former smoker 01/31/24   Outpatient Medications Prior to Visit  Medication Sig Dispense Refill   ALPRAZolam  (XANAX ) 0.5 MG tablet Take 0.5 mg by mouth at bedtime as needed for sleep or anxiety.  1   ARIPiprazole  (ABILIFY ) 2 MG tablet Take 2 mg by mouth every morning.     b complex vitamins tablet Take 1 tablet by mouth daily.     Biotin 5 MG CAPS Take 5 mg by mouth daily.     Blood Glucose Monitoring Suppl DEVI 1 each by Does not apply route in the morning, at noon, and at bedtime. May substitute to any manufacturer covered by patient's insurance. 1 each 0   Calcium  Citrate (CITRACAL PO) Take 2 tablets by mouth daily.     carvedilol  (COREG ) 25 MG tablet Take 1 tablet (25 mg total) by mouth 2 (two) times daily. 180 tablet 3   celecoxib  (CELEBREX ) 200 MG capsule Take 200 mg by mouth 2 (two) times daily.     Cholecalciferol  (VITAMIN D ) 50 MCG (2000 UT) tablet Take 2,000 Units by mouth daily.      dicyclomine  (BENTYL ) 20 MG tablet Take 20 mg by mouth every 8 (eight) hours as needed for spasms.     diltiazem  (CARDIZEM ) 30 MG tablet Take 30 mg by mouth every 4 (four) hours as needed (for HR>100 and BP>100).     diphenhydrAMINE  (BENADRYL ) 25 MG tablet Take 25 mg by mouth every 6 (six) hours as needed for allergies.     DULoxetine  (CYMBALTA ) 30 MG capsule Take 30 mg by mouth daily.     DULoxetine  (CYMBALTA ) 60 MG  capsule Take 60 mg by mouth daily.     empagliflozin  (JARDIANCE ) 25 MG TABS tablet Take 1 tablet (25 mg total) by mouth daily before breakfast. 90 tablet 1   finasteride (PROSCAR) 5 MG tablet SMARTSIG:0.25 Tablet(s) By Mouth Daily     glipiZIDE  (GLUCOTROL  XL) 5 MG 24 hr tablet TAKE 1 TABLET BY MOUTH EVERY DAY WITH BREAKFAST 90 tablet 1   glucose blood (ACCU-CHEK GUIDE TEST) test strip 1 each by Other route daily. Use as instructed 100 strip 1   KLOR-CON  M20 20 MEQ tablet Take 20 mEq by mouth daily.     loperamide  (IMODIUM ) 2 MG  capsule Take 2-4 mg by mouth as needed for diarrhea or loose stools.     magnesium  oxide (MAG-OX) 400 (240 Mg) MG tablet TAKE 1 TABLET BY MOUTH EVERY DAY 15 tablet 0   methocarbamol  (ROBAXIN ) 500 MG tablet Take 1 tablet (500 mg total) by mouth every 6 (six) hours as needed for muscle spasms. 40 tablet 2   Multiple Vitamins-Minerals (MULTIVITAMIN ADULT PO) Take 1 tablet by mouth daily.     omeprazole (PRILOSEC) 20 MG capsule Take 20 mg by mouth daily.     pregabalin  (LYRICA ) 75 MG capsule Take 75 mg by mouth 3 (three) times daily.     rosuvastatin  (CRESTOR ) 20 MG tablet Take 20 mg by mouth daily after supper.     SUMAtriptan  (IMITREX ) 100 MG tablet Take 100 mg by mouth as needed for migraine.     telmisartan (MICARDIS) 80 MG tablet Take 80 mg by mouth daily.     tirzepatide  (MOUNJARO ) 5 MG/0.5ML Pen INJECT 5 MG SUBCUTANEOUSLY WEEKLY 6 mL 1   VENTOLIN  HFA 108 (90 BASE) MCG/ACT inhaler Inhale 1-2 puffs into the lungs every 6 (six) hours as needed for wheezing or shortness of breath.   3   No facility-administered medications prior to visit.     Review of Systems: as per hpi  Constitutional:   No  weight loss, night sweats,  Fevers, chills, fatigue, or  lassitude.  HEENT:   No headaches,  Difficulty swallowing,  Tooth/dental problems, or  Sore throat,                No sneezing, itching, ear ache, nasal congestion, post nasal drip,   CV:  No chest pain,  Orthopnea,  PND, swelling in lower extremities, anasarca, dizziness, palpitations, syncope.   GI  No heartburn, indigestion, abdominal pain, nausea, vomiting, diarrhea, change in bowel habits, loss of appetite, bloody stools.   Resp: No shortness of breath with exertion or at rest.  No excess mucus, no productive cough,  No non-productive cough,  No coughing up of blood.  No change in color of mucus.  No wheezing.  No chest wall deformity  Skin: no rash or lesions.  GU: no dysuria, change in color of urine, no urgency or frequency.  No flank pain, no hematuria   MS:  No joint pain or swelling.  No decreased range of motion.  No back pain.    Physical Exam  BP 138/70   Pulse 69   Ht 4' 11 (1.499 m)   Wt 218 lb (98.9 kg)   SpO2 96%   BMI 44.03 kg/m   GEN: A/Ox3; pleasant , NAD, well nourished    HEENT:  [place]/AT,  EACs-clear, TMs-wnl, NOSE-clear, THROAT-clear, no lesions, no postnasal drip or exudate noted.   NECK:  Supple w/ fair ROM; no JVD; normal carotid impulses w/o bruits; no thyromegaly or nodules palpated; no lymphadenopathy.    RESP  Clear  P & A; w/o, wheezes/ rales/ or rhonchi. no accessory muscle use, no dullness to percussion  CARD:  RRR, no m/r/g, no peripheral edema, pulses intact, no cyanosis or clubbing.  GI:   obese, soft & nt; nml bowel sounds; no organomegaly or masses detected.   Musco: Warm bil, no deformities or joint swelling noted.   Neuro: alert, no focal deficits noted.    Skin: Warm, no lesions or rashes    Lab Results:  CBC    Component Value Date/Time   WBC 10.2 01/14/2023 2358   RBC 3.52 (L)  01/14/2023 2358   HGB 9.9 (L) 01/14/2023 2358   HCT 31.2 (L) 01/14/2023 2358   PLT 157 01/14/2023 2358   MCV 88.6 01/14/2023 2358   MCH 28.1 01/14/2023 2358   MCHC 31.7 01/14/2023 2358   RDW 13.1 01/14/2023 2358   LYMPHSABS 1.7 12/03/2022 1830   MONOABS 0.7 12/03/2022 1830   EOSABS 0.1 12/03/2022 1830   BASOSABS 0.0 12/03/2022 1830    BMET     Component Value Date/Time   NA 138 11/06/2023 1004   NA 138 10/03/2020 1511   K 4.4 11/06/2023 1004   CL 103 11/06/2023 1004   CO2 25 11/06/2023 1004   GLUCOSE 135 (H) 11/06/2023 1004   BUN 19 11/06/2023 1004   BUN 13 10/03/2020 1511   CREATININE 1.29 (H) 11/06/2023 1004   CALCIUM  9.8 11/06/2023 1004   GFRNONAA 39 (L) 01/14/2023 2358   GFRAA >60 10/09/2017 1956    BNP No results found for: BNP  ProBNP    Component Value Date/Time   PROBNP 44.0 01/23/2009 2022    Imaging: No results found.  Administration History     None           No data to display          No results found for: NITRICOXIDE   Assessment & Plan:   Assessment & Plan Obstructive sleep apnea  Chronic cough  Assessment and Plan Assessment & Plan Obstructive sleep apnea Well-controlled with CPAP. AHI reduced to 0.9 events/hour. Mask leaks present but do not affect control. - Continue CPAP therapy. - Monitor mask fit and comfort; consider refitting if leaks worsen. - Follow up in one year unless issues arise.    Return in about 1 year (around 07/22/2025) for compliance donwload.  Candis Dandy, PA-C 07/22/2024      [1]  Allergies Allergen Reactions   Penicillins     Joints ache   Aspirin Hives and Rash   Ibuprofen Hives and Rash   Naproxen Hives and Rash   Tape Rash    Adhesive tape causes rashes. Paper tape is okay to use  [2]  Social History Tobacco Use  Smoking Status Former   Current packs/day: 0.00   Average packs/day: 2.0 packs/day for 15.0 years (30.0 ttl pk-yrs)   Types: Cigarettes   Start date: 10/07/1964   Quit date: 10/08/1979   Years since quitting: 44.8  Smokeless Tobacco Never  Tobacco Comments   Former smoker 01/31/24   "

## 2024-07-22 NOTE — Patient Instructions (Signed)
 Continue CPAP usage with goal of nightly usage of at least 4-6 hours or more as tolerated.  If needed, please reach out and we can place a referral for a mask fitting.  Follow up in one year; return sooner if new or worsening complaints.
